# Patient Record
Sex: Female | Born: 1950 | Race: White | Hispanic: No | Smoking: Former smoker
Health system: Southern US, Community
[De-identification: ages and names within clinical notes are randomized; demographics above are authoritative.]

## PROBLEM LIST (undated history)

## (undated) DIAGNOSIS — H8103 Meniere's disease, bilateral: Secondary | ICD-10-CM

## (undated) DIAGNOSIS — N189 Chronic kidney disease, unspecified: Secondary | ICD-10-CM

## (undated) DIAGNOSIS — G4730 Sleep apnea, unspecified: Secondary | ICD-10-CM

## (undated) DIAGNOSIS — I2510 Atherosclerotic heart disease of native coronary artery without angina pectoris: Secondary | ICD-10-CM

## (undated) DIAGNOSIS — I10 Essential (primary) hypertension: Secondary | ICD-10-CM

## (undated) DIAGNOSIS — Z8679 Personal history of other diseases of the circulatory system: Secondary | ICD-10-CM

## (undated) DIAGNOSIS — M810 Age-related osteoporosis without current pathological fracture: Secondary | ICD-10-CM

## (undated) DIAGNOSIS — I517 Cardiomegaly: Secondary | ICD-10-CM

## (undated) DIAGNOSIS — I639 Cerebral infarction, unspecified: Secondary | ICD-10-CM

## (undated) DIAGNOSIS — M1990 Unspecified osteoarthritis, unspecified site: Secondary | ICD-10-CM

## (undated) DIAGNOSIS — I219 Acute myocardial infarction, unspecified: Secondary | ICD-10-CM

## (undated) DIAGNOSIS — R42 Dizziness and giddiness: Secondary | ICD-10-CM

## (undated) DIAGNOSIS — J45909 Unspecified asthma, uncomplicated: Secondary | ICD-10-CM

## (undated) DIAGNOSIS — I340 Nonrheumatic mitral (valve) insufficiency: Secondary | ICD-10-CM

## (undated) DIAGNOSIS — I5189 Other ill-defined heart diseases: Secondary | ICD-10-CM

## (undated) DIAGNOSIS — E785 Hyperlipidemia, unspecified: Secondary | ICD-10-CM

## (undated) DIAGNOSIS — F329 Major depressive disorder, single episode, unspecified: Secondary | ICD-10-CM

## (undated) DIAGNOSIS — J302 Other seasonal allergic rhinitis: Secondary | ICD-10-CM

## (undated) DIAGNOSIS — B1920 Unspecified viral hepatitis C without hepatic coma: Secondary | ICD-10-CM

## (undated) DIAGNOSIS — F32A Depression, unspecified: Secondary | ICD-10-CM

## (undated) DIAGNOSIS — J449 Chronic obstructive pulmonary disease, unspecified: Secondary | ICD-10-CM

## (undated) DIAGNOSIS — M489 Spondylopathy, unspecified: Secondary | ICD-10-CM

## (undated) HISTORY — PX: COLON SURGERY: SHX602

## (undated) HISTORY — DX: Hyperlipidemia, unspecified: E78.5

## (undated) HISTORY — DX: Cerebral infarction, unspecified: I63.9

## (undated) HISTORY — DX: Major depressive disorder, single episode, unspecified: F32.9

## (undated) HISTORY — DX: Meniere's disease, bilateral: H81.03

## (undated) HISTORY — PX: ABDOMINAL HYSTERECTOMY: SHX81

## (undated) HISTORY — PX: DILATION AND CURETTAGE OF UTERUS: SHX78

## (undated) HISTORY — DX: Unspecified viral hepatitis C without hepatic coma: B19.20

## (undated) HISTORY — DX: Acute myocardial infarction, unspecified: I21.9

## (undated) HISTORY — DX: Chronic kidney disease, unspecified: N18.9

## (undated) HISTORY — DX: Depression, unspecified: F32.A

## (undated) HISTORY — PX: TONSILLECTOMY: SUR1361

## (undated) HISTORY — PX: OVARIAN CYST REMOVAL: SHX89

## (undated) HISTORY — DX: Essential (primary) hypertension: I10

---

## 2011-11-01 DIAGNOSIS — I639 Cerebral infarction, unspecified: Secondary | ICD-10-CM

## 2011-11-01 HISTORY — DX: Cerebral infarction, unspecified: I63.9

## 2013-10-18 ENCOUNTER — Ambulatory Visit: Payer: Self-pay | Admitting: Family Medicine

## 2014-07-31 ENCOUNTER — Ambulatory Visit: Payer: Self-pay | Admitting: Family Medicine

## 2014-08-04 ENCOUNTER — Ambulatory Visit: Payer: Self-pay | Admitting: Family Medicine

## 2014-08-04 DIAGNOSIS — I517 Cardiomegaly: Secondary | ICD-10-CM

## 2014-08-06 DIAGNOSIS — I639 Cerebral infarction, unspecified: Secondary | ICD-10-CM | POA: Insufficient documentation

## 2014-08-06 DIAGNOSIS — R51 Headache: Secondary | ICD-10-CM

## 2014-08-06 DIAGNOSIS — R519 Headache, unspecified: Secondary | ICD-10-CM | POA: Insufficient documentation

## 2014-08-06 HISTORY — DX: Cerebral infarction, unspecified: I63.9

## 2014-08-08 ENCOUNTER — Ambulatory Visit: Payer: Self-pay | Admitting: Family Medicine

## 2014-12-15 ENCOUNTER — Ambulatory Visit: Payer: Self-pay | Admitting: Family Medicine

## 2015-05-12 ENCOUNTER — Other Ambulatory Visit: Payer: Self-pay | Admitting: Family Medicine

## 2015-05-12 NOTE — Telephone Encounter (Signed)
Routing to provider  

## 2015-07-28 ENCOUNTER — Other Ambulatory Visit: Payer: Self-pay | Admitting: Family Medicine

## 2015-09-16 ENCOUNTER — Other Ambulatory Visit: Payer: Self-pay | Admitting: Family Medicine

## 2015-09-16 NOTE — Telephone Encounter (Signed)
rx

## 2015-09-16 NOTE — Telephone Encounter (Signed)
Routing to provider  

## 2015-09-16 NOTE — Telephone Encounter (Signed)
Patient is way overdue for visit and labs Rx sent as requested TERESA-- Please let [name] know that I'd like to see patient for an appointment here in the office for:   Please schedule a visit with me  in the next: two weeks if possible, no more than a month out if I am booked Fasting?  yes Thank you, Dr. Sanda Klein Also, last note from kidney doctor in [facility] is from May; please add nephrologist to care team and request last note and labs

## 2015-10-10 ENCOUNTER — Encounter: Payer: Self-pay | Admitting: Family Medicine

## 2015-10-10 NOTE — Telephone Encounter (Signed)
After 4x calling and leaving vm, I'm sending letter out. 10/10/15

## 2015-10-14 ENCOUNTER — Other Ambulatory Visit: Payer: Self-pay | Admitting: Family Medicine

## 2015-10-14 NOTE — Telephone Encounter (Signed)
Routing to provider  

## 2015-10-15 NOTE — Telephone Encounter (Signed)
Patient needs appt; letter went out on 10/10/15 rx approved

## 2015-11-19 ENCOUNTER — Other Ambulatory Visit: Payer: Self-pay | Admitting: Family Medicine

## 2015-11-20 NOTE — Telephone Encounter (Signed)
I'll send Rx, but we've not seen her in over 6 months Please let [name] know that I'd like to see patient for an appointment here in the office for:  Follow-up Please schedule a visit with me  in the next: few weeks Fasting?  Yes please Thank you, Dr. Sanda Klein

## 2015-11-20 NOTE — Telephone Encounter (Signed)
Called patient and left vm to return our call and schedule a f/u appointment with fasting labs in the next couple of weeks.

## 2015-11-25 ENCOUNTER — Other Ambulatory Visit: Payer: Self-pay | Admitting: Family Medicine

## 2015-11-27 NOTE — Telephone Encounter (Signed)
2nd vm left on patient phone to call us back and schedule a wellness physical if she hasn't had one/follow-up since she had not been been in a while.

## 2015-11-27 NOTE — Telephone Encounter (Signed)
Please ask patient to schedule an appt; she's not been seen for awhile; if she's due for Medicare wellness visit, plesae schedule that Rx sent

## 2015-12-17 ENCOUNTER — Other Ambulatory Visit: Payer: Self-pay | Admitting: Family Medicine

## 2015-12-17 NOTE — Telephone Encounter (Signed)
Needs to be seen

## 2015-12-22 ENCOUNTER — Other Ambulatory Visit: Payer: Self-pay | Admitting: Family Medicine

## 2015-12-22 NOTE — Telephone Encounter (Signed)
Patient needs an appointment

## 2015-12-24 ENCOUNTER — Telehealth: Payer: Self-pay | Admitting: Family Medicine

## 2015-12-24 MED ORDER — MECLIZINE HCL 25 MG PO TABS: 25.0000 mg | ORAL_TABLET | Freq: Three times a day (TID) | ORAL | Status: DC | PRN

## 2015-12-24 NOTE — Telephone Encounter (Signed)
meclizine (ANTIVERT) 25 MG tablet  Patient states that she is out of her medication and can not come in for a f/u because she is out in another [place] and wont be back until mid May. [facility] [phone] this is the pharmacy number not fax. If any question please call patient [phone].

## 2015-12-24 NOTE — Telephone Encounter (Signed)
Forward to provider

## 2015-12-24 NOTE — Telephone Encounter (Signed)
sent 

## 2015-12-30 ENCOUNTER — Other Ambulatory Visit: Payer: Self-pay

## 2015-12-30 NOTE — Telephone Encounter (Signed)
She would like a 90 day supply on the meclizine rx. She is going to be out of state until May.

## 2015-12-31 NOTE — Telephone Encounter (Signed)
I'm sorry, but we've not seen her since January of 2016 (over a year ago) Twenty pills is all I'm comfortable prescribing; if she'll be going to be gone that long, she likely needs to get established with someone there or at least go to an urgent care We really need to see our patients at least once a year and we sent out a letter in December asking her to schedule an appointment for follow-up

## 2015-12-31 NOTE — Telephone Encounter (Signed)
Patient notified

## 2016-01-07 ENCOUNTER — Encounter: Payer: Self-pay | Admitting: Family Medicine

## 2016-01-07 NOTE — Telephone Encounter (Signed)
Letter sent home 01/07/16 °

## 2016-01-13 ENCOUNTER — Other Ambulatory Visit: Payer: Self-pay | Admitting: Family Medicine

## 2016-01-16 ENCOUNTER — Other Ambulatory Visit: Payer: Self-pay | Admitting: Family Medicine

## 2016-01-30 ENCOUNTER — Other Ambulatory Visit: Payer: Self-pay | Admitting: Family Medicine

## 2016-02-01 NOTE — Telephone Encounter (Signed)
Please forward to appropriate provider; thank you

## 2016-02-01 NOTE — Telephone Encounter (Signed)
Thank you, rxs approved

## 2016-02-01 NOTE — Telephone Encounter (Signed)
She is actually scheduled to see you at [facility] on May 1st.

## 2016-02-29 ENCOUNTER — Ambulatory Visit: Payer: Self-pay | Admitting: Family Medicine

## 2016-03-24 ENCOUNTER — Encounter: Payer: Self-pay | Admitting: Family Medicine

## 2016-03-24 ENCOUNTER — Ambulatory Visit (INDEPENDENT_AMBULATORY_CARE_PROVIDER_SITE_OTHER): Payer: Federal, State, Local not specified - PPO | Admitting: Family Medicine

## 2016-03-24 VITALS — BP 102/72 | HR 67 | Temp 98.0°F | Resp 16 | Ht 60.0 in | Wt 146.1 lb

## 2016-03-24 DIAGNOSIS — I209 Angina pectoris, unspecified: Secondary | ICD-10-CM

## 2016-03-24 DIAGNOSIS — Z780 Asymptomatic menopausal state: Secondary | ICD-10-CM | POA: Diagnosis not present

## 2016-03-24 DIAGNOSIS — Z0000 Encounter for general adult medical examination without abnormal findings: Secondary | ICD-10-CM | POA: Diagnosis not present

## 2016-03-24 DIAGNOSIS — Z8619 Personal history of other infectious and parasitic diseases: Secondary | ICD-10-CM | POA: Diagnosis not present

## 2016-03-24 DIAGNOSIS — H8103 Meniere's disease, bilateral: Secondary | ICD-10-CM | POA: Diagnosis not present

## 2016-03-24 DIAGNOSIS — Z114 Encounter for screening for human immunodeficiency virus [HIV]: Secondary | ICD-10-CM | POA: Insufficient documentation

## 2016-03-24 DIAGNOSIS — Z9889 Other specified postprocedural states: Secondary | ICD-10-CM

## 2016-03-24 DIAGNOSIS — Z1159 Encounter for screening for other viral diseases: Secondary | ICD-10-CM | POA: Diagnosis not present

## 2016-03-24 DIAGNOSIS — E7800 Pure hypercholesterolemia, unspecified: Secondary | ICD-10-CM | POA: Diagnosis not present

## 2016-03-24 DIAGNOSIS — N181 Chronic kidney disease, stage 1: Secondary | ICD-10-CM | POA: Insufficient documentation

## 2016-03-24 DIAGNOSIS — Z1239 Encounter for other screening for malignant neoplasm of breast: Secondary | ICD-10-CM

## 2016-03-24 DIAGNOSIS — I252 Old myocardial infarction: Secondary | ICD-10-CM

## 2016-03-24 DIAGNOSIS — Z8673 Personal history of transient ischemic attack (TIA), and cerebral infarction without residual deficits: Secondary | ICD-10-CM | POA: Diagnosis not present

## 2016-03-24 HISTORY — DX: Meniere's disease, bilateral: H81.03

## 2016-03-24 MED ORDER — MECLIZINE HCL 25 MG PO TABS: 25.0000 mg | ORAL_TABLET | Freq: Three times a day (TID) | ORAL | Status: DC | PRN

## 2016-03-24 NOTE — Patient Instructions (Addendum)
Have labs done today I've put in referral to ENT and GI and cardiology for you If you have not heard anything from my staff in a week about any orders/referrals/studies from today, please contact us here to follow-up [phone] Please do call to schedule your mammogram; the number to schedule one at either [facility] or [facility] is [phone] Please do call to schedule your bone density study; the number to schedule one at either [facility] or [facility] is [phone]  Health Maintenance, Female Adopting a healthy lifestyle and getting preventive care can go a long way to promote health and wellness. Talk with your health care provider about what schedule of regular examinations is right for you. This is a good chance for you to check in with your provider about disease prevention and staying healthy. In between checkups, there are plenty of things you can do on your own. Experts have done a lot of research about which lifestyle changes and preventive measures are most likely to keep you healthy. Ask your health care provider for more information. WEIGHT AND DIET  Eat a healthy diet  Be sure to include plenty of vegetables, fruits, low-fat dairy products, and lean protein.  Do not eat a lot of foods high in solid fats, added sugars, or salt.  Get regular exercise. This is one of the most important things you can do for your health.  Most adults should exercise for at least 150 minutes each week. The exercise should increase your heart rate and make you sweat (moderate-intensity exercise).  Most adults should also do strengthening exercises at least twice a week. This is in addition to the moderate-intensity exercise.  Maintain a healthy weight  Body mass index (BMI) is a measurement that can be used to identify possible weight problems. It estimates body fat based on height and weight. Your health care provider can help  determine your BMI and help you achieve or maintain a healthy weight.  For females 27 years of age and older:   A BMI below 18.5 is considered underweight.  A BMI of 18.5 to 24.9 is normal.  A BMI of 25 to 29.9 is considered overweight.  A BMI of 30 and above is considered obese.  Watch levels of cholesterol and blood lipids  You should start having your blood tested for lipids and cholesterol at 66 years of age, then have this test every 5 years.  You may need to have your cholesterol levels checked more often if:  Your lipid or cholesterol levels are high.  You are older than 66 years of age.  You are at high risk for heart disease.  CANCER SCREENING   Lung Cancer  Lung cancer screening is recommended for adults 32-60 years old who are at high risk for lung cancer because of a history of smoking.  A yearly low-dose CT scan of the lungs is recommended for people who:  Currently smoke.  Have quit within the past 15 years.  Have at least a 30-pack-year history of smoking. A pack year is smoking an average of one pack of cigarettes a day for 1 year.  Yearly screening should continue until it has been 15 years since you quit.  Yearly screening should stop if you develop a health problem that would prevent you from having lung cancer treatment.  Breast Cancer  Practice breast self-awareness. This means understanding how your breasts normally appear and feel.  It also means  doing regular breast self-exams. Let your health care provider know about any changes, no matter how small.  If you are in your 20s or 30s, you should have a clinical breast exam (CBE) by a health care provider every 1-3 years as part of a regular health exam.  If you are 51 or older, have a CBE every year. Also consider having a breast X-ray (mammogram) every year.  If you have a family history of breast cancer, talk to your health care provider about genetic screening.  If you are at high risk  for breast cancer, talk to your health care provider about having an MRI and a mammogram every year.  Breast cancer gene (BRCA) assessment is recommended for women who have family members with BRCA-related cancers. BRCA-related cancers include:  Breast.  Ovarian.  Tubal.  Peritoneal cancers.  Results of the assessment will determine the need for genetic counseling and BRCA1 and BRCA2 testing. Cervical Cancer Your health care provider may recommend that you be screened regularly for cancer of the pelvic organs (ovaries, uterus, and vagina). This screening involves a pelvic examination, including checking for microscopic changes to the surface of your cervix (Pap test). You may be encouraged to have this screening done every 3 years, beginning at age 50.  For women ages 67-65, health care providers may recommend pelvic exams and Pap testing every 3 years, or they may recommend the Pap and pelvic exam, combined with testing for human papilloma virus (HPV), every 5 years. Some types of HPV increase your risk of cervical cancer. Testing for HPV may also be done on women of any age with unclear Pap test results.  Other health care providers may not recommend any screening for nonpregnant women who are considered low risk for pelvic cancer and who do not have symptoms. Ask your health care provider if a screening pelvic exam is right for you.  If you have had past treatment for cervical cancer or a condition that could lead to cancer, you need Pap tests and screening for cancer for at least 20 years after your treatment. If Pap tests have been discontinued, your risk factors (such as having a new sexual partner) need to be reassessed to determine if screening should resume. Some women have medical problems that increase the chance of getting cervical cancer. In these cases, your health care provider may recommend more frequent screening and Pap tests. Colorectal Cancer  This type of cancer can be  detected and often prevented.  Routine colorectal cancer screening usually begins at 66 years of age and continues through 66 years of age.  Your health care provider may recommend screening at an earlier age if you have risk factors for colon cancer.  Your health care provider may also recommend using home test kits to check for hidden blood in the stool.  A small camera at the end of a tube can be used to examine your colon directly (sigmoidoscopy or colonoscopy). This is done to check for the earliest forms of colorectal cancer.  Routine screening usually begins at age 34.  Direct examination of the colon should be repeated every 5-10 years through 66 years of age. However, you may need to be screened more often if early forms of precancerous polyps or small growths are found. Skin Cancer  Check your skin from head to toe regularly.  Tell your health care provider about any new moles or changes in moles, especially if there is a change in a mole's shape or  color.  Also tell your health care provider if you have a mole that is larger than the size of a pencil eraser.  Always use sunscreen. Apply sunscreen liberally and repeatedly throughout the day.  Protect yourself by wearing long sleeves, pants, a wide-brimmed hat, and sunglasses whenever you are outside. HEART DISEASE, DIABETES, AND HIGH BLOOD PRESSURE   High blood pressure causes heart disease and increases the risk of stroke. High blood pressure is more likely to develop in:  People who have blood pressure in the high end of the normal range (130-139/85-89 mm Hg).  People who are overweight or obese.  People who are African American.  If you are 59-21 years of age, have your blood pressure checked every 3-5 years. If you are 109 years of age or older, have your blood pressure checked every year. You should have your blood pressure measured twice--once when you are at a hospital or clinic, and once when you are not at a  hospital or clinic. Record the average of the two measurements. To check your blood pressure when you are not at a hospital or clinic, you can use:  An automated blood pressure machine at a pharmacy.  A home blood pressure monitor.  If you are between 15 years and 70 years old, ask your health care provider if you should take aspirin to prevent strokes.  Have regular diabetes screenings. This involves taking a blood sample to check your fasting blood sugar level.  If you are at a normal weight and have a low risk for diabetes, have this test once every three years after 66 years of age.  If you are overweight and have a high risk for diabetes, consider being tested at a younger age or more often. PREVENTING INFECTION  Hepatitis B  If you have a higher risk for hepatitis B, you should be screened for this virus. You are considered at high risk for hepatitis B if:  You were born in a country where hepatitis B is common. Ask your health care provider which countries are considered high risk.  Your parents were born in a high-risk country, and you have not been immunized against hepatitis B (hepatitis B vaccine).  You have HIV or AIDS.  You use needles to inject street drugs.  You live with someone who has hepatitis B.  You have had sex with someone who has hepatitis B.  You get hemodialysis treatment.  You take certain medicines for conditions, including cancer, organ transplantation, and autoimmune conditions. Hepatitis C  Blood testing is recommended for:  Everyone born from [birth year] through 1966.  Anyone with known risk factors for hepatitis C. Sexually transmitted infections (STIs)  You should be screened for sexually transmitted infections (STIs) including gonorrhea and chlamydia if:  You are sexually active and are younger than 66 years of age.  You are older than 67 years of age and your health care provider tells you that you are at risk for this type of  infection.  Your sexual activity has changed since you were last screened and you are at an increased risk for chlamydia or gonorrhea. Ask your health care provider if you are at risk.  If you do not have HIV, but are at risk, it may be recommended that you take a prescription medicine daily to prevent HIV infection. This is called pre-exposure prophylaxis (PrEP). You are considered at risk if:  You are sexually active and do not regularly use condoms or know the HIV status  of your partner(s).  You take drugs by injection.  You are sexually active with a partner who has HIV. Talk with your health care provider about whether you are at high risk of being infected with HIV. If you choose to begin PrEP, you should first be tested for HIV. You should then be tested every 3 months for as long as you are taking PrEP.  PREGNANCY   If you are premenopausal and you may become pregnant, ask your health care provider about preconception counseling.  If you may become pregnant, take 400 to 800 micrograms (mcg) of folic acid every day.  If you want to prevent pregnancy, talk to your health care provider about birth control (contraception). OSTEOPOROSIS AND MENOPAUSE   Osteoporosis is a disease in which the bones lose minerals and strength with aging. This can result in serious bone fractures. Your risk for osteoporosis can be identified using a bone density scan.  If you are 34 years of age or older, or if you are at risk for osteoporosis and fractures, ask your health care provider if you should be screened.  Ask your health care provider whether you should take a calcium or vitamin D supplement to lower your risk for osteoporosis.  Menopause may have certain physical symptoms and risks.  Hormone replacement therapy may reduce some of these symptoms and risks. Talk to your health care provider about whether hormone replacement therapy is right for you.  HOME CARE INSTRUCTIONS   Schedule regular  health, dental, and eye exams.  Stay current with your immunizations.   Do not use any tobacco products including cigarettes, chewing tobacco, or electronic cigarettes.  If you are pregnant, do not drink alcohol.  If you are breastfeeding, limit how much and how often you drink alcohol.  Limit alcohol intake to no more than 1 drink per day for nonpregnant women. One drink equals 12 ounces of beer, 5 ounces of wine, or 1 ounces of hard liquor.  Do not use street drugs.  Do not share needles.  Ask your health care provider for help if you need support or information about quitting drugs.  Tell your health care provider if you often feel depressed.  Tell your health care provider if you have ever been abused or do not feel safe at home.   This information is not intended to replace advice given to you by your health care provider. Make sure you discuss any questions you have with your health care provider.   Document Released: 05/02/2011 Document Revised: 11/07/2014 Document Reviewed: 09/18/2013 Elsevier Interactive Patient Education 2016 [place]. Angina Pectoris Angina pectoris, often called angina, is extreme discomfort in the chest, neck, or arm. This is caused by a lack of blood in the middle and thickest layer of the heart wall (myocardium). There are four types of angina:  Stable angina. Stable angina usually occurs in episodes of predictable frequency and duration. It is usually brought on by physical activity, stress, or excitement. Stable angina usually lasts a few minutes and can often be relieved by a medicine that you place under your tongue. This medicine is called sublingual nitroglycerin.  Unstable angina. Unstable angina can occur even when you are doing little or no physical activity. It can even occur while you are sleeping or when you are at rest. It can suddenly increase in severity or frequency. It may not be relieved by sublingual nitroglycerin, and it can  last up to 30 minutes.  Microvascular angina. This type of  angina is caused by a disorder of tiny blood vessels called arterioles. Microvascular angina is more common in women. The pain may be more severe and last longer than other types of angina pectoris.  Prinzmetal or variant angina. This type of angina pectoris is rare and usually occurs when you are doing little or no physical activity. It especially occurs in the early morning hours. CAUSES Atherosclerosis is the cause of angina. This is the buildup of fat and cholesterol (plaque) on the inside of the arteries. Over time, the plaque may narrow or block the artery, and this will lessen blood flow to the heart. Plaque can also become weak and break off within a coronary artery to form a clot and cause a sudden blockage. RISK FACTORS Risk factors common to both men and women include:  High cholesterol levels.  High blood pressure (hypertension).  Tobacco use.  Diabetes.  Family history of angina.  Obesity.  Lack of exercise.  A diet high in saturated fats. Women are at greater risk for angina if they are:  Over age 14.  Postmenopausal. SYMPTOMS Many people do not experience any symptoms during the early stages of angina. As the condition progresses, symptoms common to both men and women may include:  Chest pain.  The pain can be described as a crushing or squeezing in the chest, or a tightness, pressure, fullness, or heaviness in the chest.  The pain can last more than a few minutes, or it can stop and recur.  Pain in the arms, neck, jaw, or back.  Unexplained heartburn or indigestion.  Shortness of breath.  Nausea.  Sudden cold sweats.  Sudden light-headedness. Many women have chest discomfort and some of the other symptoms. However, women often have different (atypical) symptoms, such as:   Fatigue.  Unexplained feelings of nervousness or anxiety.  Unexplained weakness.  Dizziness or  fainting. Sometimes, women may have angina without any symptoms. DIAGNOSIS  Tests to diagnose angina may include:  ECG (electrocardiogram).  Exercise stress test. This looks for signs of blockage when the heart is being exercised.  Pharmacologic stress test. This test looks for signs of blockage when the heart is being stressed with a medicine.  Blood tests.  Coronary angiogram. This is a procedure to look at the coronary arteries to see if there is any blockage. TREATMENT  The treatment of angina may include the following:  Healthy behavioral changes to reduce or control risk factors.  Medicine.  Coronary stenting.A stent helps to keep an artery open.  Coronary angioplasty. This procedure widens a narrowed or blocked artery.  Coronary arterybypass surgery. This will allow your blood to pass the blockage (bypass) to reach your heart. HOME CARE INSTRUCTIONS   Take medicines only as directed by your health care provider.  Do not take the following medicines unless your health care provider approves:  Nonsteroidal anti-inflammatory drugs (NSAIDs), such as ibuprofen, naproxen, or celecoxib.  Vitamin supplements that contain vitamin A, vitamin E, or both.  Hormone replacement therapy that contains estrogen with or without progestin.  Manage other health conditions such as hypertension and diabetes as directed by your health care provider.  Follow a heart-healthy diet. A dietitian can help to educate you about healthy food options and changes.  Use healthy cooking methods such as roasting, grilling, broiling, baking, poaching, steaming, or stir-frying. Talk to a dietitian to learn more about healthy cooking methods.  Follow an exercise program approved by your health care provider.  Maintain a healthy weight. Lose  weight as approved by your health care provider.  Plan rest periods when fatigued.  Learn to manage stress.  Do not use any tobacco products, including  cigarettes, chewing tobacco, or electronic cigarettes. If you need help quitting, ask your health care provider.  If you drink alcohol, and your health care provider approves, limit your alcohol intake to no more than 1 drink per day. One drink equals 12 ounces of beer, 5 ounces of wine, or 1 ounces of hard liquor.  Stop illegal drug use.  Keep all follow-up visits as directed by your health care provider. This is important. SEEK IMMEDIATE MEDICAL CARE IF:   You have pain in your chest, neck, arm, jaw, stomach, or back that lasts more than a few minutes, is recurring, or is unrelieved by taking sublingualnitroglycerin.  You have profuse sweating without cause.  You have unexplained:  Heartburn or indigestion.  Shortness of breath or difficulty breathing.  Nausea or vomiting.  Fatigue.  Feelings of nervousness or anxiety.  Weakness.  Diarrhea.  You have sudden light-headedness or dizziness.  You faint. These symptoms may represent a serious problem that is an emergency. Do not wait to see if the symptoms will go away. Get medical help right away. Call your local emergency services (911 in the [place] Do not drive yourself to the hospital.   This information is not intended to replace advice given to you by your health care provider. Make sure you discuss any questions you have with your health care provider.   Document Released: 10/17/2005 Document Revised: 11/07/2014 Document Reviewed: 02/18/2014 Elsevier Interactive Patient Education [employer].

## 2016-03-24 NOTE — Progress Notes (Signed)
Patient ID: Tricia Vasquez, female   DOB: 30-Jan-1951, 65 y.o.   MRN: [id number]   Subjective:   Tricia Vasquez is a 65 y.o. female here for a complete physical exam  Interim issues since last visit: has been out of state  USPSTF grade A and B recommendations Alcohol: no Depression:. Depression screen PHQ 2/9 03/24/2016  Decreased Interest 1  Down, Depressed, Hopeless 1  PHQ - 2 Score 2  Altered sleeping 0  Tired, decreased energy 1  Change in appetite 1  Feeling bad or failure about yourself  0  Trouble concentrating 1  Moving slowly or fidgety/restless 1  Suicidal thoughts 0  PHQ-9 Score 6  Difficult doing work/chores Somewhat difficult  thinks it might just be PMS; treating like PMS; no SI/HI Taking fluoxetine 60 mg daily; since 02/25/2011 when he older brother died; stable on dose Hypertension: beautiful BP today Obesity: no; lost 10 pounds since last visit; running after grandchild Tobacco use: former smoker; quit in 02-24-73 HIV, hep C: has hep C; no treatment, did hers through herbal medicine; no sign in the blood; they offered her treatment years ago when first diagnosed, never did interferon STD testing and prevention (chl/gon/syphilis):  Lipids: today Glucose: today Colorectal cancer: ordered Breast cancer: ordered Cervical cancer screening: n/a Lung cancer: n/a Osteoporosis: ordered dexa Fall prevention/vitamin D: taking vit D, discussed AAA: no fam hx Aspirin: taking 81 mg aspirin Diet: typical American eater Exercise: regular exercise, does get chest pain, long-term, ongoing; uses NTG Skin cancer: no worrisome moles  Past Medical History  Diagnosis Date  . Hypertension   . Hyperlipidemia   . Depression   . Hepatitis C   . Chronic kidney disease   . Heart attack ([place])   . Stroke ([place])   . Heart attack ([place])   . 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 Tonsillectomy    . Abdominal hysterectomy    .  Ovarian cyst removal    . Colon surgery     Family History  Problem Relation Age of Onset  . Hypertension Mother   . Hypertension Father   . Hypertension Sister   . Hypertension Brother    Social History  Substance Use Topics  . Smoking status: Former Research scientist (life sciences)  . Smokeless tobacco: Never Used  . Alcohol Use: No   Review of Systems  HENT: Positive for hearing loss (in 1970s; meniere's disease; right worse).        Blew a polyp out of her nose in [place]; very little bleeding  Cardiovascular: Positive for chest pain (angina, relieved by one NTG; going on for years, no recent stress test).  Gastrointestinal: Negative for blood in stool.  Genitourinary: Negative for hematuria.  Hematological: Bruises/bleeds easily (easy bruising).  Psychiatric/Behavioral: Negative for dysphoric mood.   Objective:   Filed Vitals:   03/24/16 1437  BP: 102/72  Pulse: 67  Temp: 98 F (36.7 C)  TempSrc: Oral  Resp: 16  Height: 5' (1.524 m)  Weight: 146 lb 1.6 oz (66.271 kg)  SpO2: 98%   Body mass index is 28.53 kg/(m^2). Wt Readings from Last 3 Encounters:  03/24/16 146 lb 1.6 oz (66.271 kg)   Physical Exam  Constitutional: She appears well-developed and well-nourished.  HENT:  Head: Normocephalic and atraumatic.  Right Ear: Hearing, tympanic membrane, external ear and ear canal normal.  Left Ear: Hearing, tympanic membrane, external ear and ear canal normal.  Eyes: Conjunctivae and EOM are  normal. Right eye exhibits no hordeolum. Left eye exhibits no hordeolum. No scleral icterus.  Neck: Carotid bruit is not present. No thyromegaly present.  Cardiovascular: Normal rate, regular rhythm, S1 normal, S2 normal and normal heart sounds.   No extrasystoles are present.  Pulmonary/Chest: Effort normal and breath sounds normal. No respiratory distress.  Abdominal: Soft. Normal appearance and bowel sounds are normal. She exhibits no distension, no abdominal bruit, no pulsatile midline mass and no  mass. There is no hepatosplenomegaly. There is no tenderness. No hernia.  Musculoskeletal: Normal range of motion. She exhibits no edema.  Lymphadenopathy:       Head (right side): No submandibular adenopathy present.       Head (left side): No submandibular adenopathy present.    She has no cervical adenopathy.    She has no axillary adenopathy.  Neurological: She is alert. She displays no tremor. No cranial nerve deficit. She exhibits normal muscle tone. Gait normal.  Reflex Scores:      Patellar reflexes are 2+ on the right side and 2+ on the left side. Skin: Skin is warm and dry. No bruising and no ecchymosis noted. No cyanosis. No pallor.  Psychiatric: Her speech is normal and behavior is normal. Thought content normal. Her mood appears not anxious. She does not exhibit a depressed mood.   Assessment/Plan:   Problem List Items Addressed This Visit      Nervous and Auditory   Meniere's disease of both ears    On antivert, but will refer to ENT      Relevant Orders   Ambulatory referral to ENT     Genitourinary   Chronic kidney disease (CKD), stage I     Other   Encounter for screening for HIV    Discussed one-time HIV screening recommendation per USPSTF guidelines; patient agrees with testing; HIV antibody ordered      Relevant Orders   HIV antibody   High blood cholesterol level    Patient did not tolerate statins; check fasting lipid panel; consider PCSK9 inhibitor; limit saturated fats      Hx of completed stroke   Hx of hepatitis C    Not sure if patient meant that she had hx of hep C or other hepatitis; will recheck hep C Ab, and f/u with quantitative and genotype if positive      Hx of myocardial infarction    First MI in 1981; has had two heart attacks in her lifetime; ongoing angina, relieved with NTG; she does not see a cardiologist, however; referral entered; she agrees to go; she reports intolerance to aspirin      Need for hepatitis C screening test     Discussed one-time hep C screening recommendation for individuals born between 1945-1965 per USPSTF guidelines; patient agrees with testing; Hep C Ab ordered      Relevant Orders   Hepatitis C antibody   Preventative health care - Primary    USPSTF grade A and B recommendations reviewed with patient; age-appropriate recommendations, preventive care, screening tests, etc discussed and encouraged; healthy living encouraged; see AVS for patient education given to patient       Other Visit Diagnoses    Angina pectoris [facility])        Relevant Orders    Ambulatory referral to Cardiology    CBC with Differential/Platelet    Comprehensive metabolic panel    Lipid Panel w/o Chol/HDL Ratio    Breast cancer screening  Relevant Orders    MM DIGITAL SCREENING BILATERAL    Postmenopausal status        Relevant Orders    DG Bone Density    S/P colonoscopy with polypectomy        Relevant Orders    Ambulatory referral to Gastroenterology       Meds ordered this encounter  Medications  . GARLIC OIL PO    Sig: Take 1 pg/oz/day by mouth once.  . Cholecalciferol (VITAMIN D) 2000 units tablet    Sig: Take 2,000 Units by mouth 3 days.  . Milk Thistle 250 MG CAPS    Sig: Take 1 capsule by mouth once.  . Misc Natural Products (DANDELION ROOT PO)    Sig: Take 1 tablet by mouth once.  Marland Kitchen BLACK WALNUT POLLEN [place]    Sig: Inject 1 tablet into the skin once.  . TURMERIC PO    Sig: Take 1 tablet by mouth once.  . Cyanocobalamin (VITAMIN B-12) 5000 MCG SUBL    Sig: Place 1 tablet under the tongue 3 days.  . meclizine (ANTIVERT) 25 MG tablet    Sig: Take 1 tablet (25 mg total) by mouth 3 (three) times daily as needed for dizziness.    Dispense:  60 tablet    Refill:  5   Orders Placed This Encounter  Procedures  . MM DIGITAL SCREENING BILATERAL  . DG Bone Density  . HIV antibody  . Hepatitis C antibody  . CBC with Differential/Platelet  . Comprehensive metabolic panel  . Lipid Panel w/o  Chol/HDL Ratio  . Ambulatory referral to Cardiology  . Ambulatory referral to Gastroenterology  . Ambulatory referral to ENT   Follow up plan: Return in about 1 year (around 03/24/2017) for complete physical; 6 months for general follow-up.  An After Visit Summary was printed and given to the patient.

## 2016-03-27 NOTE — Assessment & Plan Note (Signed)
On antivert, but will refer to ENT

## 2016-03-27 NOTE — Assessment & Plan Note (Signed)
Discussed one-time HIV screening recommendation per USPSTF guidelines; patient agrees with testing; HIV antibody ordered 

## 2016-03-27 NOTE — Assessment & Plan Note (Signed)
Patient did not tolerate statins; check fasting lipid panel; consider PCSK9 inhibitor; limit saturated fats

## 2016-03-27 NOTE — Assessment & Plan Note (Signed)
Not sure if patient meant that she had hx of hep C or other hepatitis; will recheck hep C Ab, and f/u with quantitative and genotype if positive

## 2016-03-27 NOTE — Assessment & Plan Note (Signed)
Discussed one-time hep C screening recommendation for individuals born between 1945-1965 per USPSTF guidelines; patient agrees with testing; Hep C Ab ordered 

## 2016-03-27 NOTE — Assessment & Plan Note (Addendum)
First MI in 1981; has had two heart attacks in her lifetime; ongoing angina, relieved with NTG; she does not see a cardiologist, however; referral entered; she agrees to go; she reports intolerance to aspirin

## 2016-03-27 NOTE — Assessment & Plan Note (Signed)
USPSTF grade A and B recommendations reviewed with patient; age-appropriate recommendations, preventive care, screening tests, etc discussed and encouraged; healthy living encouraged; see AVS for patient education given to patient  

## 2016-04-11 ENCOUNTER — Encounter: Payer: Self-pay | Admitting: *Deleted

## 2016-04-11 DIAGNOSIS — E876 Hypokalemia: Secondary | ICD-10-CM | POA: Insufficient documentation

## 2016-04-11 DIAGNOSIS — J45909 Unspecified asthma, uncomplicated: Secondary | ICD-10-CM | POA: Insufficient documentation

## 2016-04-11 DIAGNOSIS — B1920 Unspecified viral hepatitis C without hepatic coma: Secondary | ICD-10-CM | POA: Insufficient documentation

## 2016-04-11 DIAGNOSIS — F32A Depression, unspecified: Secondary | ICD-10-CM | POA: Insufficient documentation

## 2016-04-11 DIAGNOSIS — H8109 Meniere's disease, unspecified ear: Secondary | ICD-10-CM | POA: Insufficient documentation

## 2016-04-11 DIAGNOSIS — G4730 Sleep apnea, unspecified: Secondary | ICD-10-CM | POA: Insufficient documentation

## 2016-04-11 DIAGNOSIS — I10 Essential (primary) hypertension: Secondary | ICD-10-CM | POA: Insufficient documentation

## 2016-04-11 DIAGNOSIS — E785 Hyperlipidemia, unspecified: Secondary | ICD-10-CM | POA: Insufficient documentation

## 2016-04-11 DIAGNOSIS — H81319 Aural vertigo, unspecified ear: Secondary | ICD-10-CM | POA: Insufficient documentation

## 2016-04-11 DIAGNOSIS — M810 Age-related osteoporosis without current pathological fracture: Secondary | ICD-10-CM | POA: Insufficient documentation

## 2016-04-11 DIAGNOSIS — E782 Mixed hyperlipidemia: Secondary | ICD-10-CM | POA: Insufficient documentation

## 2016-04-11 DIAGNOSIS — B182 Chronic viral hepatitis C: Secondary | ICD-10-CM | POA: Insufficient documentation

## 2016-04-11 DIAGNOSIS — F329 Major depressive disorder, single episode, unspecified: Secondary | ICD-10-CM | POA: Insufficient documentation

## 2016-04-15 ENCOUNTER — Other Ambulatory Visit: Payer: Self-pay | Admitting: Family Medicine

## 2016-04-15 NOTE — Telephone Encounter (Signed)
Refill approved. Thank you.

## 2016-04-15 NOTE — Telephone Encounter (Signed)
Looks like this lovely lady is yours!

## 2016-04-16 ENCOUNTER — Other Ambulatory Visit: Payer: Self-pay | Admitting: Family Medicine

## 2016-04-23 ENCOUNTER — Encounter: Payer: Self-pay | Admitting: Family Medicine

## 2016-04-28 ENCOUNTER — Other Ambulatory Visit: Payer: Self-pay

## 2016-04-28 MED ORDER — HYDROCHLOROTHIAZIDE 25 MG PO TABS: 25.0000 mg | ORAL_TABLET | Freq: Every day | ORAL | Status: DC

## 2016-04-28 MED ORDER — MONTELUKAST SODIUM 10 MG PO TABS: 10.0000 mg | ORAL_TABLET | Freq: Every day | ORAL | Status: DC

## 2016-04-28 MED ORDER — ATENOLOL 50 MG PO TABS: 50.0000 mg | ORAL_TABLET | Freq: Every day | ORAL | Status: DC

## 2016-04-28 NOTE — Telephone Encounter (Signed)
Patient requesting refill. 

## 2016-04-28 NOTE — Telephone Encounter (Signed)
I still don't have lab results that were ordered in May; I really need those to verify that her kidneys are working well; please ask her have done within the next 5 days so I'll get results

## 2016-05-04 IMAGING — MR MRI HEAD WITHOUT AND WITH CONTRAST
11 of 12 series · 40 of 48 positions shown · IV contrast (13 ML MULTIHANCE)
Comparison: Head CT 07/31/2014

CLINICAL DATA: Stroke, 4 weeks ago. Trauma to the head, striking
the head on a cabinet 4 weeks ago. Right-sided weakness since then.
Right facial weakness.

EXAM:
MRI HEAD WITHOUT AND WITH CONTRAST
TECHNIQUE: Multiplanar, multiecho pulse sequences of the brain and surrounding
structures were obtained without and with intravenous contrast.
CONTRAST:  13 cc MultiHance

[Series 2: T1 · sagittal · 5.0mm · 0.45mm/px · 1 of 27 slices shown]
[im 1/27]
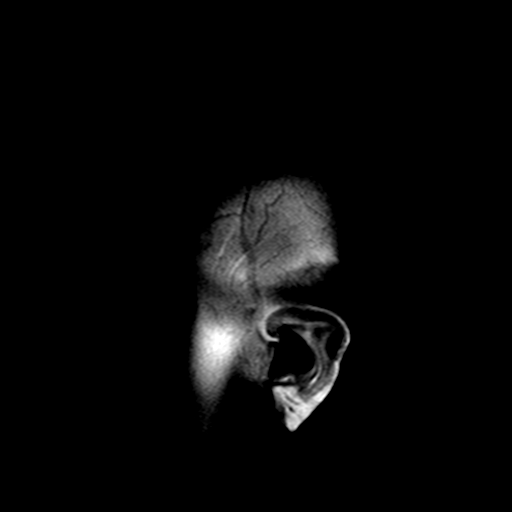

[Series 4: DWI · axial · 5.0mm · 1.80mm/px · z∈[-64,+92]mm · 3 of 25 slices shown (1 of 4)]
[im 1/25]
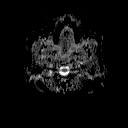
[im 13/25]
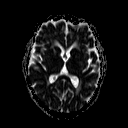
[im 25/25]
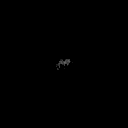

[Series 5: DWI · axial · 5.0mm · 1.80mm/px · z∈[-57,+86]mm · 3 of 23 slices shown (2 of 4)]
[im 1/23]
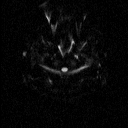
[im 12/23]
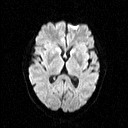
[im 23/23]
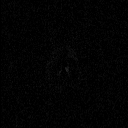

[Series 7: DWI · coronal · 5.0mm · 1.80mm/px · 5 of 38 slices shown (3 of 4)]
[im 1/38]
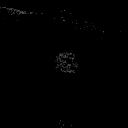
[im 10/38]
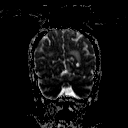
[im 19/38]
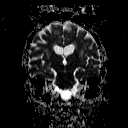
[im 28/38]
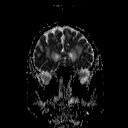
[im 38/38]
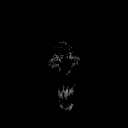

[Series 8: T2 · axial · 5.0mm · 0.45mm/px · z∈[-63,+93]mm · 3 of 25 slices shown (1 of 3)]
[im 1/25]
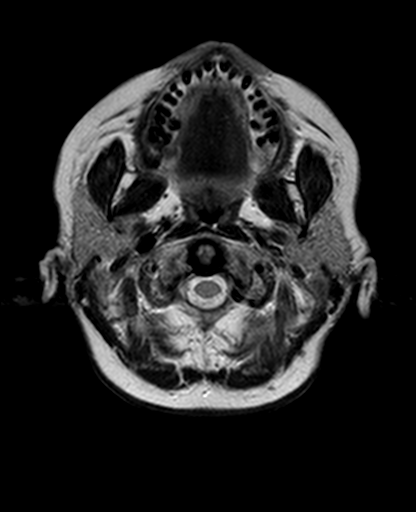
[im 13/25]
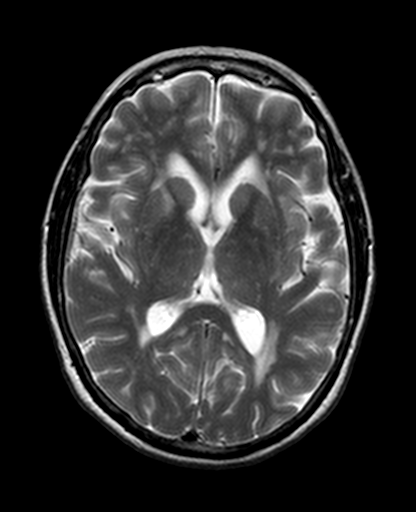
[im 25/25]
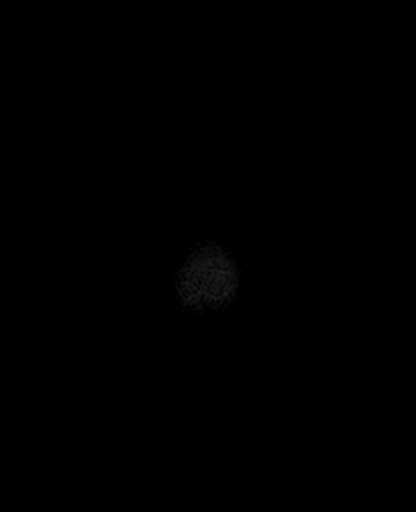

[Series 9: DWI · coronal · 5.0mm · 1.80mm/px · 4 of 35 slices shown (4 of 4)]
[im 1/35]
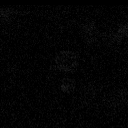
[im 12/35]
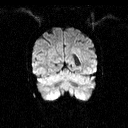
[im 23/35]
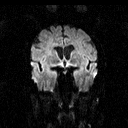
[im 35/35]
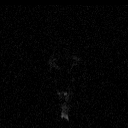

[Series 10: FLAIR · axial · 5.0mm · 0.90mm/px · z∈[-63,+93]mm · 3 of 25 slices shown]
[im 1/25]
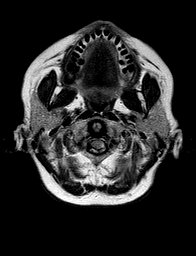
[im 13/25]
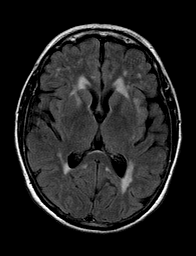
[im 25/25]
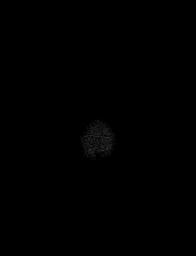

[Series 11: T2 · axial · 5.0mm · 0.45mm/px · z∈[-63,+93]mm · 3 of 25 slices shown (2 of 3)]
[im 1/25]
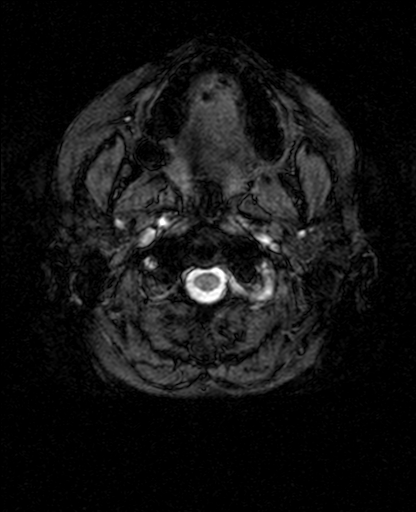
[im 13/25]
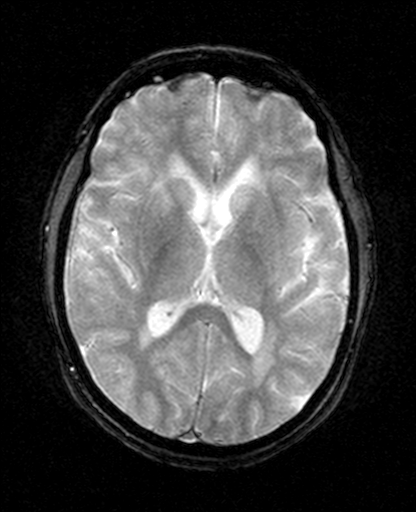
[im 25/25]
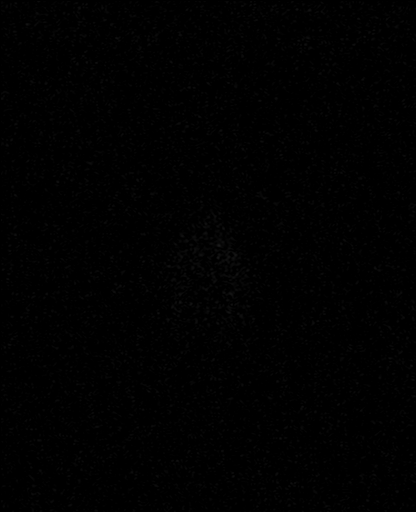

[Series 13: T2 · coronal · 5.0mm · 0.45mm/px · 4 of 30 slices shown (3 of 3)]
[im 1/30]
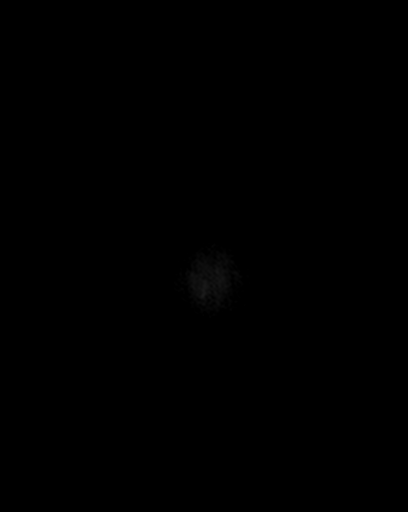
[im 10/30]
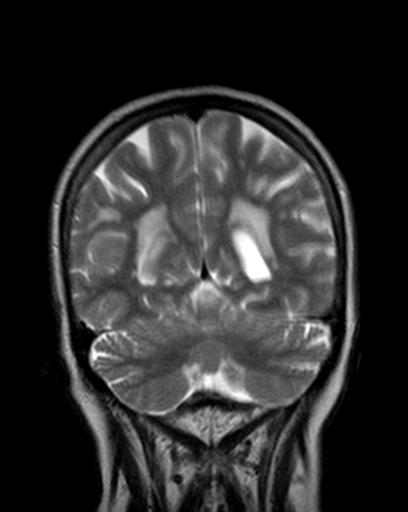
[im 20/30]
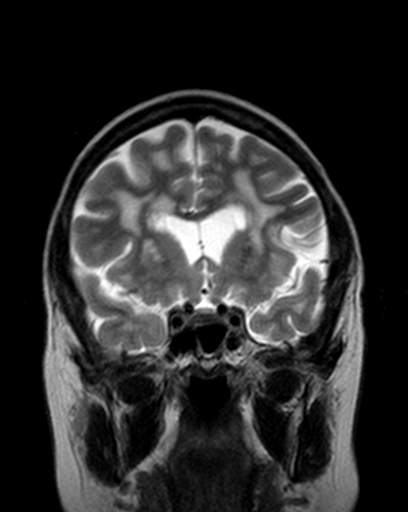
[im 30/30]
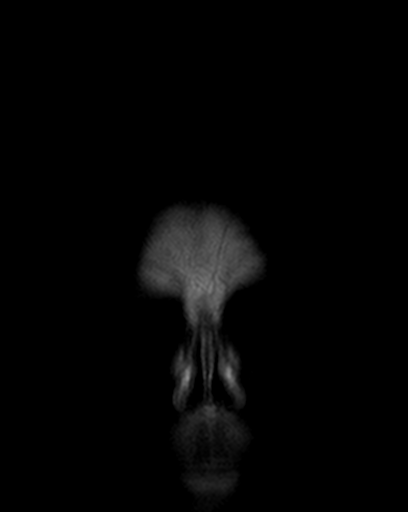

[Series 14: T1 post-contrast · axial · 3.0mm · 0.45mm/px · z∈[-73,+103]mm · 7 of 60 slices shown (1 of 2)]
[im 1/60]
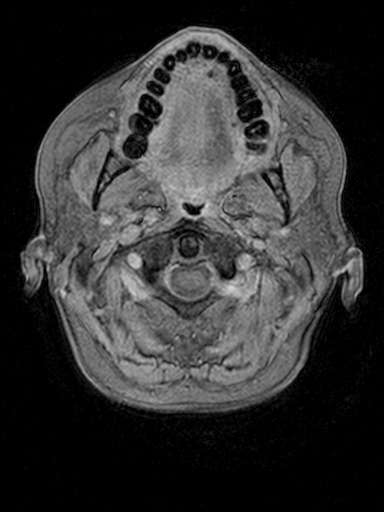
[im 10/60]
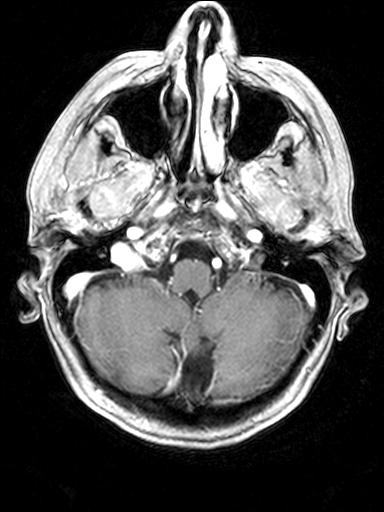
[im 20/60]
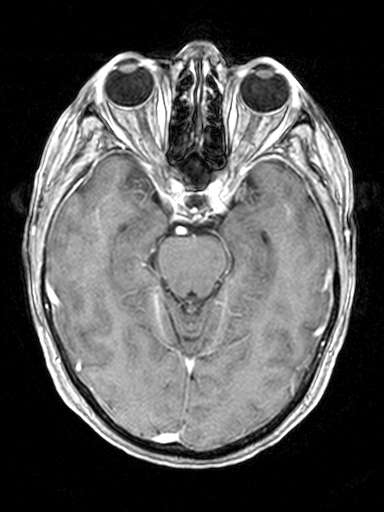
[im 30/60]
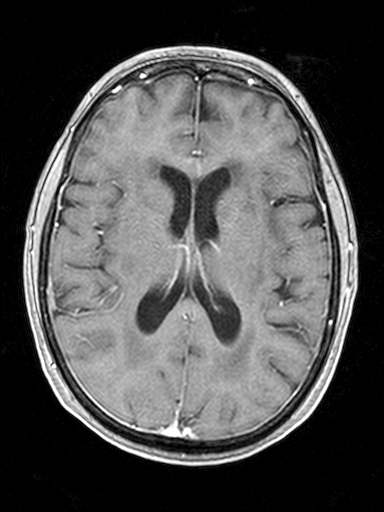
[im 40/60]
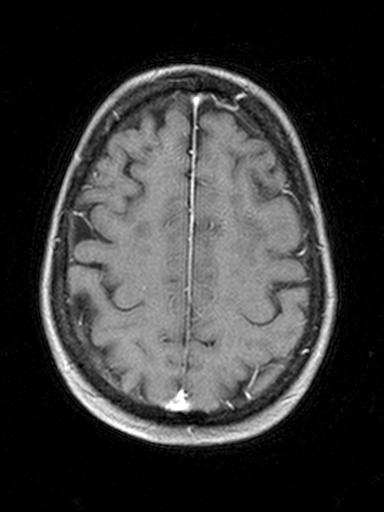
[im 50/60]
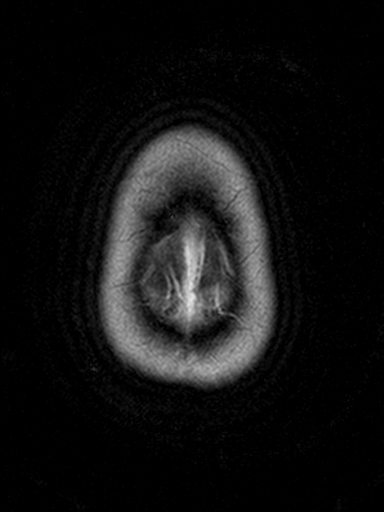
[im 60/60]
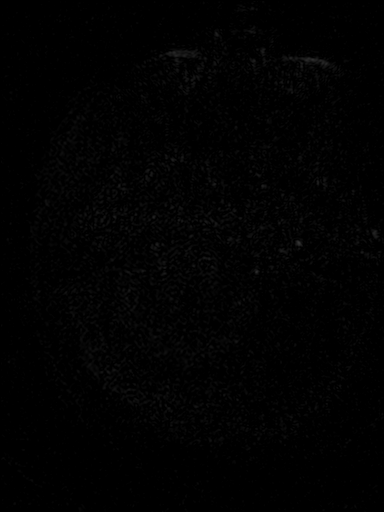

[Series 15: T1 post-contrast · coronal · 5.0mm · 0.45mm/px · 4 of 30 slices shown (2 of 2)]
[im 1/30]
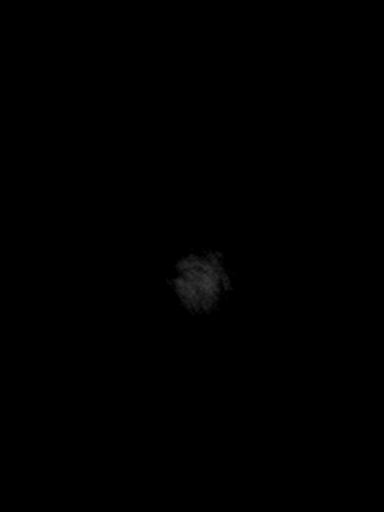
[im 10/30]
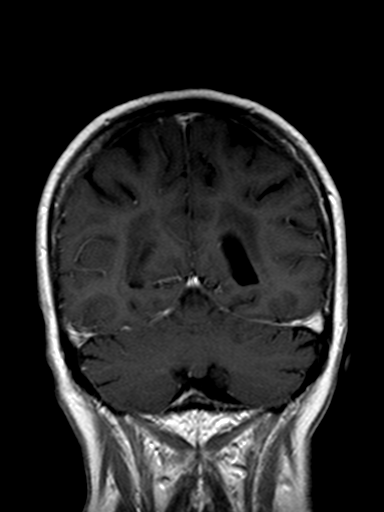
[im 20/30]
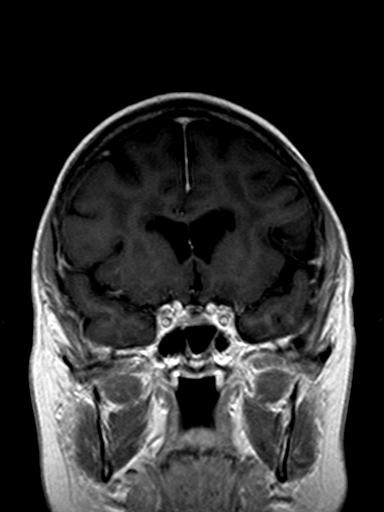
[im 30/30]
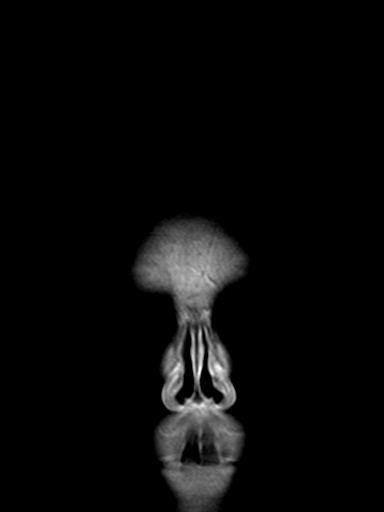

[40 of 48 positions shown; findings below may reference images not displayed]

FINDINGS: Diffusion imaging does not show any acute or subacute infarction.
The brainstem and cerebellum are normal. The cerebral hemispheres
show patchy and confluent areas of FLAIR and T2 signal throughout
the deep and subcortical white matter consistent with chronic small
vessel ischemic change. No large vessel territory infarction. No
mass lesion, hemorrhage, hydrocephalus or extra-axial collection.
After contrast administration, no abnormal enhancement occurs. No
pituitary mass. No inflammatory sinus disease. No skull or skullbase
lesion. Major vessels are patent at the base of the brain.
IMPRESSION: No sign of recent infarction. No traumatic finding. Advanced chronic
small vessel ischemic changes throughout the cerebral hemispheric
white matter.

## 2016-07-22 ENCOUNTER — Other Ambulatory Visit: Payer: Self-pay

## 2016-07-22 MED ORDER — ALBUTEROL SULFATE HFA 108 (90 BASE) MCG/ACT IN AERS: 2.0000 | INHALATION_SPRAY | RESPIRATORY_TRACT | 0 refills | Status: DC | PRN

## 2016-08-26 ENCOUNTER — Other Ambulatory Visit: Payer: Self-pay

## 2016-08-28 MED ORDER — NITROGLYCERIN 0.4 MG SL SUBL: 0.4000 mg | SUBLINGUAL_TABLET | SUBLINGUAL | 0 refills | Status: DC | PRN

## 2016-08-30 ENCOUNTER — Other Ambulatory Visit: Payer: Self-pay | Admitting: Family Medicine

## 2016-08-31 NOTE — Telephone Encounter (Signed)
Chart reviewed; labs were ordered in May, no results Please contact patient I really need to see her lab results for any further refills; I am approving 30 days only but she has to have labs done for any additional medicine If not able to come in for labs and out of state, we'll recommend she establish with someone there to monitor her health

## 2016-09-01 NOTE — Telephone Encounter (Signed)
Called pt regarding  medication refill. Left a detail messaged about her medication and suggest pt to call us back if she has any concerns or question regarding the message I left.

## 2016-09-01 NOTE — Telephone Encounter (Signed)
Called pt and left a detail message.

## 2016-09-18 ENCOUNTER — Other Ambulatory Visit: Payer: Self-pay | Admitting: Family Medicine

## 2016-09-20 NOTE — Telephone Encounter (Signed)
Patient has outstanding labs from 6 months ago appt next week appt and labs needed; will discuss compliance at her appt next week

## 2016-09-21 ENCOUNTER — Telehealth: Payer: Self-pay | Admitting: Family Medicine

## 2016-09-21 MED ORDER — FLUOXETINE HCL 60 MG PO TABS: 1.0000 | ORAL_TABLET | Freq: Every day | ORAL | 0 refills | Status: DC

## 2016-09-21 NOTE — Telephone Encounter (Signed)
Pt informed

## 2016-09-21 NOTE — Telephone Encounter (Signed)
I will allow one refill of 30 days only I recommend she make an appointment now for when she'll be in town If she is living in [place] now and cannot come in for appointments and labs, we'll recommend that she get established with a doctor in [place] It's so very important for her health to be monitored; we are just trying to look out for her We wish her a happy Thanksgiving

## 2016-09-21 NOTE — Telephone Encounter (Signed)
Patient is out of states with her daughter and will not be back in town til after Dec 5 can you please five enough fluoxetine to last  Til then?

## 2016-09-21 NOTE — Telephone Encounter (Signed)
Pt is out of town for the holiday and could not make her appt for Monday. Pt request refill on Fluoxetine to be sent to [facility] [place]. Pt is there for the holidays and states if she needs to come here to be seen then it has to be December.

## 2016-09-26 ENCOUNTER — Ambulatory Visit: Payer: Federal, State, Local not specified - PPO | Admitting: Family Medicine

## 2016-09-30 DIAGNOSIS — F411 Generalized anxiety disorder: Secondary | ICD-10-CM | POA: Insufficient documentation

## 2016-09-30 DIAGNOSIS — J4540 Moderate persistent asthma, uncomplicated: Secondary | ICD-10-CM | POA: Insufficient documentation

## 2017-03-06 ENCOUNTER — Other Ambulatory Visit: Payer: Self-pay | Admitting: Family Medicine

## 2017-03-06 NOTE — Telephone Encounter (Signed)
Patient appears to not be in the area anymore Please clarify; this request came from [place] If she has moved out of state, we suggest she finds a new doctor She never got the labs done that I ordered last May We wish her the best, but if she's not in the area, we recommend she find a doctor close to home

## 2017-03-06 NOTE — Telephone Encounter (Signed)
Left voice mail

## 2017-03-28 ENCOUNTER — Encounter: Payer: Federal, State, Local not specified - PPO | Admitting: Family Medicine

## 2017-03-30 ENCOUNTER — Other Ambulatory Visit: Payer: Self-pay | Admitting: Family Medicine

## 2017-12-22 DIAGNOSIS — M5412 Radiculopathy, cervical region: Secondary | ICD-10-CM | POA: Insufficient documentation

## 2018-01-03 ENCOUNTER — Other Ambulatory Visit: Payer: Self-pay | Admitting: Family Medicine

## 2018-01-04 NOTE — Telephone Encounter (Signed)
Rx denied Patient has not been seen since 2017 Rx request to [place] so she may find a doctor there

## 2018-07-24 ENCOUNTER — Encounter: Payer: Federal, State, Local not specified - PPO | Admitting: Family Medicine

## 2018-08-08 ENCOUNTER — Encounter: Payer: Self-pay | Admitting: Nurse Practitioner

## 2018-08-08 ENCOUNTER — Telehealth: Payer: Self-pay

## 2018-08-08 NOTE — Progress Notes (Deleted)
Patient: Tricia Vasquez, Female    DOB: 20-Oct-1951, 67 y.o.   MRN: [id number]  Visit Date: 08/08/2018  Today's Provider: Fredderick Severance, NP   No chief complaint on file.   Subjective:    HPI [name] is a 67 y.o. female who presents today for her Subsequent Annual Wellness Visit.  Patient/Caregiver input:    Past Medical History:  Diagnosis Date  . Chronic kidney disease   . Depression   . Heart attack   . Heart attack   . Hepatitis C   . Hyperlipidemia   . Hypertension   . 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 Stroke [facility])     Past Surgical History:  Procedure Laterality Date  . ABDOMINAL HYSTERECTOMY    . COLON SURGERY    . OVARIAN CYST REMOVAL    . TONSILLECTOMY      Family History  Problem Relation Age of Onset  . Hypertension Mother   . Hypertension Father   . Hypertension Sister   . Hypertension Brother     Social History   Socioeconomic History  . Marital status: Divorced    Spouse name: Not on file  . Number of children: Not on file  . Years of education: Not on file  . Highest education level: Not on file  Occupational History  . Not on file  Social Needs  . Financial resource strain: Not on file  . Food insecurity:    Worry: Not on file    Inability: Not on file  . Transportation needs:    Medical: Not on file    Non-medical: Not on file  Tobacco Use  . Smoking status: Former Research scientist (life sciences)  . Smokeless tobacco: Never Used  Substance and Sexual Activity  . Alcohol use: No    Alcohol/week: 0.0 standard drinks  . Drug use: No  . Sexual activity: Not Currently  Lifestyle  . Physical activity:    Days per week: Not on file    Minutes per session: Not on file  . Stress: Not on file  Relationships  . Social connections:    Talks on phone: Not on file    Gets together: Not on file    Attends religious service: Not on file    Active member of club or organization: Not on file    Attends meetings of clubs or organizations:  Not on file    Relationship status: Not on file  . Intimate partner violence:    Fear of current or ex partner: Not on file    Emotionally abused: Not on file    Physically abused: Not on file    Forced sexual activity: Not on file  Other Topics Concern  . Not on file  Social History Narrative  . Not on file    Outpatient Encounter Medications as of 08/09/2018  Medication Sig  . albuterol (VENTOLIN HFA) 108 (90 Base) MCG/ACT inhaler Inhale 2 puffs into the lungs every 4 (four) hours as needed for wheezing or shortness of breath.  Marland Kitchen aspirin EC 81 MG tablet Take 81 mg by mouth 2 (two) times daily.  Marland Kitchen atenolol (TENORMIN) 50 MG tablet Take 1 tablet (50 mg total) by mouth daily.  . beclomethasone (QVAR) 80 MCG/ACT inhaler Inhale 2 puffs into the lungs 2 (two) times daily.  Marland Kitchen BLACK WALNUT POLLEN [place] Inject 1 tablet into the skin once.  . Cholecalciferol (VITAMIN D) 2000 units tablet Take 2,000 Units by mouth 3 days.  . Cyanocobalamin (VITAMIN B-12)  King 1 tablet under the tongue 3 days.  . fexofenadine (ALLEGRA) 180 MG tablet Take 180 mg by mouth daily.  Marland Kitchen FLUoxetine HCl 60 MG TABS Take 1 tablet by mouth daily. Labs and appt needed for further refills  . fluticasone (FLONASE) 50 MCG/ACT nasal spray USE 2 SPRAYS DAILY PER NOSTRIL  . GARLIC OIL PO Take 1 pg/oz/day by mouth once.  . hydrochlorothiazide (HYDRODIURIL) 25 MG tablet TAKE 1 TABLET BY MOUTH DAILY  . meclizine (ANTIVERT) 25 MG tablet Take 1 tablet (25 mg total) by mouth 3 (three) times daily as needed for dizziness.  . Milk Thistle 250 MG CAPS Take 1 capsule by mouth once.  . Misc Natural Products (DANDELION ROOT PO) Take 1 tablet by mouth once.  . montelukast (SINGULAIR) 10 MG tablet Take 1 tablet (10 mg total) by mouth at bedtime.  . nitroGLYCERIN (NITROSTAT) 0.4 MG SL tablet Place 1 tablet (0.4 mg total) under the tongue every 5 (five) minutes as needed for chest pain. Call 911  . TURMERIC PO Take 1 tablet by mouth  once.   No facility-administered encounter medications on file as of 08/09/2018.     Allergies  Allergen Reactions  . Aspirin Other (See Comments)    Tongue swelling  . Penicillins   . Statins     Care Team Updated in EHR: {Yes/No:30480221}  Last Vision Exam: *** Wears corrective lenses: {Yes/No:30480221} Last Dental Exam: *** Last Hearing Exam: *** Wears Hearing Aids: {Yes/No:30480221}  Functional Ability / Safety Screening 1.  Was the timed Get Up and Go test shorter than 30 seconds?  {Blank single:19197::"yes","no"} 2.  Does the patient need help with the phone, transportation, shopping,      preparing meals, housework, laundry, medications, or managing money?  {Blank single:19197::"yes","no"} 3.  Is the patient's home free of loose throw rugs in walkways, pet beds, electrical cords, etc?   {Blank single:19197::"yes","no"}      Grab bars in the bathroom? {Blank single:19197::"yes","no"}      Handrails on the stairs?   {Blank single:19197::"yes","no"}      Adequate lighting?   {Blank single:19197::"yes","no"} 4.  Has the patient noticed any hearing difficulties?   {Blank single:19197::"yes","no"}  Diet Recall and Exercise Regimen: ***  Advanced Care Planning: A voluntary discussion about advance care planning including the explanation and discussion of advance directives.  Discussed health care proxy and Living will, and the patient was able to identify a health care proxy as ***.  Patient {DOES_DOES HKV:42595} have a living will at present time. If patient does have living will, I have requested they bring this to the clinic to be scanned in to their chart. Does patient have a HCPOA?    {Blank single:19197::"yes","no"} If yes, name and contact information:  Does patient have a living will or MOST form?  {Blank single:19197::"yes","no"}  Cancer Screenings: Lung: *** Low Dose CT Chest recommended if Age 28-80 years, 30 pack-year currently smoking OR have quit w/in 15years.  Patient {DOES NOT does:27190::"does not"} qualify. Breast: *** Up to date on Mammogram? {Yes/No:30480221}  Up to date of Bone Density/Dexa? {Yes/No:30480221} Colorectal: ***  Additional Screenings: *** Hepatitis B/HIV/Syphillis: Hepatitis C Screening:  Intimate Partner Violence:   Objective:   Vitals: There were no vitals taken for this visit. There is no height or weight on file to calculate BMI.  No exam data present  Cognitive Testing - 6-CIT  Correct? Score   What year is it? {YES NO:22349} {Numbers; 0-4:31231} Yes = 0  No = 4  What month is it? {YES NO:22349} {Numbers; 0-4:31231} Yes = 0    No = 3  Remember:     Pia Mau, [place], [place]     What time is it? {YES NO:22349} {Numbers; 0-4:31231} Yes = 0    No = 3  Count backwards from 20 to 1 {YES NO:22349} {Numbers; 0-4:31231} Correct = 0    1 error = 2   More than 1 error = 4  Say the months of the year in reverse. {YES NO:22349} {Numbers; 0-4:31231} Correct = 0    1 error = 2   More than 1 error = 4  What address did I ask you to remember? {YES NO:22349} {NUMBERS; 0-10:5044} Correct = 0  1 error = 2    2 error = 4    3 error = 6    4 error = 8    All wrong = 10       TOTAL SCORE  {Numbers; 6-16:07371}/06   Interpretation:  {Desc; normal/abnormal:11317::"Normal"}  Normal (0-7) Abnormal (8-28)   Fall Risk: Fall Risk  03/24/2016  Falls in the past year? Yes  Number falls in past yr: 2 or more  Injury with Fall? Yes  Risk Factor Category  High Fall Risk  Risk for fall due to : History of fall(s);Impaired vision    Depression Screen Depression screen [facility] 2/9 03/24/2016  Decreased Interest 1  Down, Depressed, Hopeless 1  PHQ - 2 Score 2  Altered sleeping 0  Tired, decreased energy 1  Change in appetite 1  Feeling bad or failure about yourself  0  Trouble concentrating 1  Moving slowly or fidgety/restless 1  Suicidal thoughts 0  PHQ-9 Score 6  Difficult doing work/chores Somewhat difficult    No  results found for this or any previous visit (from the past 2160 hour(s)).  Assessment & Plan:    There are no diagnoses linked to this encounter. *** type dotphrase "dot" diagmed to refresh this list  Exercise Activities and Dietary recommendations Goals   None    - Discussed health benefits of physical activity, and encouraged her to engage in regular exercise appropriate for her age and condition.    There is no immunization history on file for this patient.  Health Maintenance  Topic Date Due  . TETANUS/TDAP  12/29/1968  . MAMMOGRAM  12/30/1999  . COLONOSCOPY  12/30/1999  . DEXA SCAN  12/30/2014  . PNA vac Low Risk Adult (1 of 2 - PCV13) 12/30/2014  . INFLUENZA VACCINE  05/31/2018  . Hepatitis C Screening  Completed    No orders of the defined types were placed in this encounter.   Current Outpatient Medications:  .  albuterol (VENTOLIN HFA) 108 (90 Base) MCG/ACT inhaler, Inhale 2 puffs into the lungs every 4 (four) hours as needed for wheezing or shortness of breath., Disp: 1 Inhaler, Rfl: 0 .  aspirin EC 81 MG tablet, Take 81 mg by mouth 2 (two) times daily., Disp: , Rfl:  .  atenolol (TENORMIN) 50 MG tablet, Take 1 tablet (50 mg total) by mouth daily., Disp: 90 tablet, Rfl: 0 .  beclomethasone (QVAR) 80 MCG/ACT inhaler, Inhale 2 puffs into the lungs 2 (two) times daily., Disp: , Rfl:  .  BLACK WALNUT POLLEN [place], Inject 1 tablet into the skin once., Disp: , Rfl:  .  Cholecalciferol (VITAMIN D) 2000 units tablet, Take 2,000 Units by mouth 3 days., Disp: , Rfl:  .  Cyanocobalamin (VITAMIN B-12) 5000 MCG SUBL, Place 1 tablet under the tongue 3 days., Disp: , Rfl:  .  fexofenadine (ALLEGRA) 180 MG tablet, Take 180 mg by mouth daily., Disp: , Rfl:  .  FLUoxetine HCl 60 MG TABS, Take 1 tablet by mouth daily. Labs and appt needed for further refills, Disp: 30 tablet, Rfl: 0 .  fluticasone (FLONASE) 50 MCG/ACT nasal spray, USE 2 SPRAYS DAILY PER NOSTRIL, Disp: 16 g, Rfl: 12 .   GARLIC OIL PO, Take 1 pg/oz/day by mouth once., Disp: , Rfl:  .  hydrochlorothiazide (HYDRODIURIL) 25 MG tablet, TAKE 1 TABLET BY MOUTH DAILY, Disp: 30 tablet, Rfl: 0 .  meclizine (ANTIVERT) 25 MG tablet, Take 1 tablet (25 mg total) by mouth 3 (three) times daily as needed for dizziness., Disp: 60 tablet, Rfl: 5 .  Milk Thistle 250 MG CAPS, Take 1 capsule by mouth once., Disp: , Rfl:  .  Misc Natural Products (DANDELION ROOT PO), Take 1 tablet by mouth once., Disp: , Rfl:  .  montelukast (SINGULAIR) 10 MG tablet, Take 1 tablet (10 mg total) by mouth at bedtime., Disp: 90 tablet, Rfl: 1 .  nitroGLYCERIN (NITROSTAT) 0.4 MG SL tablet, Place 1 tablet (0.4 mg total) under the tongue every 5 (five) minutes as needed for chest pain. Call 911, Disp: 25 tablet, Rfl: 0 .  TURMERIC PO, Take 1 tablet by mouth once., Disp: , Rfl:  There are no discontinued medications.  I have personally reviewed and addressed the Medicare Annual Wellness health risk assessment questionnaire and have noted the following in the patient's chart:  A.         Medical and social history & family history B.         Use of alcohol, tobacco, and illicit drugs  C.         Current medications and supplements D.         Functional and Cognitive ability and status E.         Nutritional status F.         Physical activity G.        Advance directives H.         List of other physicians I.          Hospitalizations, surgeries, and ER visits in previous 12 months J.         [place] such as hearing, vision, cognitive function, and depression L.         Referrals and appointments: ***  In addition, I have reviewed and discussed with patient certain preventive protocols, quality metrics, and best practice recommendations. A written personalized care plan for preventive services as well as general preventive health recommendations were provided to patient.  See attached scanned questionnaire for additional  information.   No follow-ups on file. *** refresh to show

## 2018-08-08 NOTE — Telephone Encounter (Signed)
LVM for pt to call the office to R/S this appt.

## 2018-08-08 NOTE — Telephone Encounter (Signed)
Can you please reschedule this pt for tomorrow for a medicare wellness with ammie, thanks

## 2018-08-09 ENCOUNTER — Ambulatory Visit: Payer: Federal, State, Local not specified - PPO | Admitting: Nurse Practitioner

## 2018-08-13 ENCOUNTER — Ambulatory Visit: Payer: Federal, State, Local not specified - PPO | Admitting: Nurse Practitioner

## 2018-09-17 ENCOUNTER — Ambulatory Visit (INDEPENDENT_AMBULATORY_CARE_PROVIDER_SITE_OTHER): Payer: Medicare Other | Admitting: Family Medicine

## 2018-09-17 ENCOUNTER — Encounter: Payer: Self-pay | Admitting: Family Medicine

## 2018-09-17 VITALS — BP 120/80 | HR 75 | Temp 98.6°F | Resp 16 | Ht 60.0 in | Wt 161.6 lb

## 2018-09-17 DIAGNOSIS — R635 Abnormal weight gain: Secondary | ICD-10-CM

## 2018-09-17 DIAGNOSIS — I252 Old myocardial infarction: Secondary | ICD-10-CM

## 2018-09-17 DIAGNOSIS — M810 Age-related osteoporosis without current pathological fracture: Secondary | ICD-10-CM | POA: Diagnosis not present

## 2018-09-17 DIAGNOSIS — I10 Essential (primary) hypertension: Secondary | ICD-10-CM

## 2018-09-17 DIAGNOSIS — E782 Mixed hyperlipidemia: Secondary | ICD-10-CM

## 2018-09-17 DIAGNOSIS — Z1211 Encounter for screening for malignant neoplasm of colon: Secondary | ICD-10-CM | POA: Diagnosis not present

## 2018-09-17 DIAGNOSIS — H8103 Meniere's disease, bilateral: Secondary | ICD-10-CM

## 2018-09-17 DIAGNOSIS — J4530 Mild persistent asthma, uncomplicated: Secondary | ICD-10-CM

## 2018-09-17 DIAGNOSIS — Z23 Encounter for immunization: Secondary | ICD-10-CM

## 2018-09-17 DIAGNOSIS — B182 Chronic viral hepatitis C: Secondary | ICD-10-CM

## 2018-09-17 DIAGNOSIS — Z5181 Encounter for therapeutic drug level monitoring: Secondary | ICD-10-CM

## 2018-09-17 MED ORDER — NITROGLYCERIN 0.4 MG SL SUBL: 0.4000 mg | SUBLINGUAL_TABLET | SUBLINGUAL | 0 refills | Status: DC | PRN

## 2018-09-17 MED ORDER — BECLOMETHASONE DIPROPIONATE 80 MCG/ACT IN AERS: 2.0000 | INHALATION_SPRAY | Freq: Two times a day (BID) | RESPIRATORY_TRACT | 11 refills | Status: DC

## 2018-09-17 MED ORDER — MONTELUKAST SODIUM 10 MG PO TABS: 10.0000 mg | ORAL_TABLET | Freq: Every day | ORAL | 11 refills | Status: DC

## 2018-09-17 MED ORDER — ATENOLOL 50 MG PO TABS: 50.0000 mg | ORAL_TABLET | Freq: Every day | ORAL | 0 refills | Status: DC

## 2018-09-17 MED ORDER — FEXOFENADINE HCL 180 MG PO TABS: 180.0000 mg | ORAL_TABLET | Freq: Every day | ORAL | 11 refills | Status: DC

## 2018-09-17 MED ORDER — HYDROCHLOROTHIAZIDE 25 MG PO TABS: 25.0000 mg | ORAL_TABLET | Freq: Every day | ORAL | 5 refills | Status: DC

## 2018-09-17 MED ORDER — ALBUTEROL SULFATE HFA 108 (90 BASE) MCG/ACT IN AERS: 2.0000 | INHALATION_SPRAY | RESPIRATORY_TRACT | 1 refills | Status: DC | PRN

## 2018-09-17 MED ORDER — FLUOXETINE HCL 60 MG PO TABS: 1.0000 | ORAL_TABLET | Freq: Every day | ORAL | 5 refills | Status: DC

## 2018-09-17 NOTE — Assessment & Plan Note (Signed)
Does not show up in the blood she says; will check quant today

## 2018-09-17 NOTE — Progress Notes (Signed)
BP 120/80 (BP Location: Left Arm, Patient Position: Sitting, Cuff Size: Normal)   Pulse 75   Temp 98.6 F (37 C) (Oral)   Resp 16   Ht 5' (1.524 m)   Wt 161 lb 9.6 oz (73.3 kg)   SpO2 97%   BMI 31.56 kg/m    Subjective:    Patient ID: Tricia Vasquez, female    DOB: [birth year], 67 y.o.   MRN: [id number]  HPI: Tricia Vasquez is a 67 y.o. female  Chief Complaint  Patient presents with  . Medication Management    HPI Patient is here for f/u Back from [place] Had a mammogram in the spring; no problems; no lumps or bumps Hx of myocardial infarction; no ongoing chest pain; she does not have a local cardiologist High cholesterol; she cannot take statins; make her sick on stomach, throw up, get ill; tried two and did not want to try any more; lovastatin is one and pravastatin was the other; eats lots of garlic  She has Meniere's disease; that was diagnosed by "a guy in [place]", he retired; she got dizzy and stood up and went to turn and felt like something hit her back in the chair; sat down; last 20 minutes; had to get up and walk slowly and felt nauseated  Osteoporosis; ordering DEXA today; not many greens recently; loves milk and ice cream; drinks whole milk; lots of work in the [employer], so busy and active; fell and broke her leg in 2007, and that's when they discovered her bones were thin  Allergies and asthma; on Qvar and allegra, flonase; using rescue inhaler daily  Stage 1 CKD; not many NSAIDs; "I stay off of 'em"  She has hepatitis C  Depression; chronic depression; started out as "clinical depression"; sometimes it gets worse; wants to continue therapy; no SI/HI;   Depression screen [facility] 2/9 09/17/2018 03/24/2016  Decreased Interest 2 1  Down, Depressed, Hopeless 2 1  PHQ - 2 Score 4 2  Altered sleeping 1 0  Tired, decreased energy 3 1  Change in appetite 0 1  Feeling bad or failure about yourself  0 0  Trouble concentrating 0 1  Moving slowly or  fidgety/restless 3 1  Suicidal thoughts 0 0  PHQ-9 Score 11 6  Difficult doing work/chores Not difficult at all Somewhat difficult   Fall Risk  09/17/2018 03/24/2016  Falls in the past year? 1 Yes  Number falls in past yr: 1 2 or more  Injury with Fall? 0 Yes  Risk Factor Category  - High Fall Risk  Risk for fall due to : - History of fall(s);Impaired vision    Relevant past medical, surgical, family and social history reviewed Past Medical History:  Diagnosis Date  . Chronic kidney disease   . Depression   . Heart attack ([place])   . Heart attack ([place])   . Hepatitis C   . Hyperlipidemia   . Hypertension   . 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 Stroke [facility])    Past Surgical History:  Procedure Laterality Date  . ABDOMINAL HYSTERECTOMY    . COLON SURGERY    . OVARIAN CYST REMOVAL    . TONSILLECTOMY     Family History  Problem Relation Age of Onset  . Hypertension Mother   . Hypertension Father   . Hypertension Sister   . Hypertension Brother    Social History   Tobacco Use  . Smoking status: Former Research scientist (life sciences)  . Smokeless tobacco:  Never Used  Substance Use Topics  . Alcohol use: No    Alcohol/week: 0.0 standard drinks  . Drug use: No     Interim medical history since last visit reviewed. Allergies and medications reviewed  Review of Systems Per HPI unless specifically indicated above     Objective:    BP 120/80 (BP Location: Left Arm, Patient Position: Sitting, Cuff Size: Normal)   Pulse 75   Temp 98.6 F (37 C) (Oral)   Resp 16   Ht 5' (1.524 m)   Wt 161 lb 9.6 oz (73.3 kg)   SpO2 97%   BMI 31.56 kg/m   Wt Readings from Last 3 Encounters:  09/17/18 161 lb 9.6 oz (73.3 kg)  03/24/16 146 lb 1.6 oz (66.3 kg)    Physical Exam  Constitutional: She appears well-developed and well-nourished. No distress.  HENT:  Head: Normocephalic and atraumatic.  Eyes: EOM are normal. No scleral icterus.  Neck: No thyromegaly present.  Cardiovascular: Normal  rate, regular rhythm and normal heart sounds.  No murmur heard. Pulmonary/Chest: Effort normal and breath sounds normal. No respiratory distress. She has no wheezes.  Abdominal: Soft. Bowel sounds are normal. She exhibits no distension.  Musculoskeletal: She exhibits no edema.  Neurological: She is alert.  Skin: Skin is warm and dry. She is not diaphoretic. No pallor.  Psychiatric: She has a normal mood and affect. Her behavior is normal. Judgment and thought content normal.      Assessment & Plan:   Problem List Items Addressed This Visit      Cardiovascular and Mediastinum   BP (high blood pressure)    Well-controlled      Relevant Medications   nitroGLYCERIN (NITROSTAT) 0.4 MG SL tablet   atenolol (TENORMIN) 50 MG tablet   hydrochlorothiazide (HYDRODIURIL) 25 MG tablet     Respiratory   Asthma without status asthmaticus    Encouraged pt to get on the QVAR regularly; goal is to use rescue inhaler no more than 2x a week; call me if not improving      Relevant Medications   albuterol (VENTOLIN HFA) 108 (90 Base) MCG/ACT inhaler   beclomethasone (QVAR) 80 MCG/ACT inhaler   montelukast (SINGULAIR) 10 MG tablet     Digestive   HCV (hepatitis C virus)    Does not show up in the blood she says; will check quant today      Relevant Orders   COMPLETE METABOLIC PANEL WITH GFR (Completed)   Hepatitis C RNA quantitative (Completed)   INR/PT (Completed)     Nervous and Auditory   Meniere's disease of both ears    Refer to ENT      Relevant Orders   Ambulatory referral to ENT     Musculoskeletal and Integument   Osteoporosis, post-menopausal - Primary   Relevant Orders   DG Bone Density   VITAMIN D 25 Hydroxy (Vit-D Deficiency, Fractures) (Completed)     Other   Hx of myocardial infarction    Refer to cardiology; no current chest pain; refilled the NTG; cannot tolerate statin; refill atenolol      Relevant Orders   Ambulatory referral to Cardiology   Lipid panel  (Completed)   HLD (hyperlipidemia)    Check lipids today; encouraged limiting saturated fats; she cannot tolerate statin; consider zetia based on numbers      Relevant Medications   nitroGLYCERIN (NITROSTAT) 0.4 MG SL tablet   atenolol (TENORMIN) 50 MG tablet   hydrochlorothiazide (HYDRODIURIL) 25 MG tablet  Other Relevant Orders   Ambulatory referral to Cardiology   Lipid panel (Completed)    Other Visit Diagnoses    Screen for colon cancer       Relevant Orders   Ambulatory referral to Gastroenterology   Need for Tdap vaccination       Relevant Orders   Tdap vaccine greater than or equal to 7yo IM   Weight gain       Relevant Orders   TSH (Completed)   Medication monitoring encounter       Relevant Orders   CBC with Differential/Platelet (Completed)       Follow up plan: Return in about 3 months (around 12/18/2018) for follow-up visit with Dr. Sanda Klein (or just after); Medicare wellness when due.  An after-visit summary was printed and given to the patient at [place].  Please see the patient instructions which may contain other information and recommendations beyond what is mentioned above in the assessment and plan.  Meds ordered this encounter  Medications  . nitroGLYCERIN (NITROSTAT) 0.4 MG SL tablet    Sig: Place 1 tablet (0.4 mg total) under the tongue every 5 (five) minutes as needed for chest pain. Call 911; max 3 per episode    Dispense:  25 tablet    Refill:  0  . atenolol (TENORMIN) 50 MG tablet    Sig: Take 1 tablet (50 mg total) by mouth daily.    Dispense:  90 tablet    Refill:  0  . albuterol (VENTOLIN HFA) 108 (90 Base) MCG/ACT inhaler    Sig: Inhale 2 puffs into the lungs every 4 (four) hours as needed for wheezing or shortness of breath.    Dispense:  1 Inhaler    Refill:  1  . beclomethasone (QVAR) 80 MCG/ACT inhaler    Sig: Inhale 2 puffs into the lungs 2 (two) times daily.    Dispense:  1 Inhaler    Refill:  11  . fexofenadine (ALLEGRA) 180 MG  tablet    Sig: Take 1 tablet (180 mg total) by mouth daily.    Dispense:  30 tablet    Refill:  11  . FLUoxetine HCl 60 MG TABS    Sig: Take 1 tablet by mouth daily. Labs and appt needed for further refills    Dispense:  30 tablet    Refill:  5  . hydrochlorothiazide (HYDRODIURIL) 25 MG tablet    Sig: Take 1 tablet (25 mg total) by mouth daily.    Dispense:  30 tablet    Refill:  5    Labs are needed for any further refills; thank you; 30 days only  . montelukast (SINGULAIR) 10 MG tablet    Sig: Take 1 tablet (10 mg total) by mouth at bedtime.    Dispense:  30 tablet    Refill:  11    Orders Placed This Encounter  Procedures  . DG Bone Density  . Tdap vaccine greater than or equal to 7yo IM  . CBC with Differential/Platelet  . COMPLETE METABOLIC PANEL WITH GFR  . Lipid panel  . TSH  . Hepatitis C RNA quantitative  . VITAMIN D 25 Hydroxy (Vit-D Deficiency, Fractures)  . INR/PT  . Ambulatory referral to Gastroenterology  . Ambulatory referral to Cardiology  . Ambulatory referral to ENT

## 2018-09-17 NOTE — Patient Instructions (Addendum)
Try to limit saturated fats in your diet (bologna, hot dogs, barbeque, cheeseburgers, hamburgers, steak, bacon, sausage, cheese, etc.) and get more fresh fruits, vegetables, and whole grains  I will suggest oat milk or almond milk Do not use soy milk  Get back on the QVAR regularly, two puffs twice a day every day

## 2018-09-17 NOTE — Assessment & Plan Note (Signed)
Check lipids today; encouraged limiting saturated fats; she cannot tolerate statin; consider zetia based on numbers

## 2018-09-17 NOTE — Assessment & Plan Note (Signed)
Refer to cardiology; no current chest pain; refilled the NTG; cannot tolerate statin; refill atenolol

## 2018-09-17 NOTE — Assessment & Plan Note (Signed)
Encouraged pt to get on the QVAR regularly; goal is to use rescue inhaler no more than 2x a week; call me if not improving

## 2018-09-17 NOTE — Assessment & Plan Note (Signed)
Well controlled 

## 2018-09-17 NOTE — Assessment & Plan Note (Signed)
Refer to ENT

## 2018-09-18 ENCOUNTER — Other Ambulatory Visit: Payer: Self-pay

## 2018-09-18 DIAGNOSIS — Z1211 Encounter for screening for malignant neoplasm of colon: Secondary | ICD-10-CM

## 2018-09-18 MED ORDER — NA SULFATE-K SULFATE-MG SULF 17.5-3.13-1.6 GM/177ML PO SOLN: 1.0000 | Freq: Once | ORAL | 0 refills | Status: AC

## 2018-09-19 ENCOUNTER — Telehealth: Payer: Self-pay

## 2018-09-19 LAB — LIPID PANEL
CHOL/HDL RATIO: 5.9 (calc) — AB (ref <5.0)
Cholesterol: 236 mg/dL — ABNORMAL HIGH (ref <200)
HDL: 40 mg/dL — ABNORMAL LOW (ref >50)
LDL CHOLESTEROL (CALC): 164 mg/dL — AB
Non-HDL Cholesterol (Calc): 196 mg/dL (calc) — ABNORMAL HIGH (ref <130)
TRIGLYCERIDES: 168 mg/dL — AB (ref <150)

## 2018-09-19 LAB — CBC WITH DIFFERENTIAL/PLATELET
BASOS PCT: 0.8 %
Basophils Absolute: 52 cells/uL (ref 0–200)
EOS PCT: 3.7 %
Eosinophils Absolute: 241 cells/uL (ref 15–500)
HCT: 40.8 % (ref 35.0–45.0)
Hemoglobin: 13.9 g/dL (ref 11.7–15.5)
Lymphs Abs: 2009 cells/uL (ref 850–3900)
MCH: 30.1 pg (ref 27.0–33.0)
MCHC: 34.1 g/dL (ref 32.0–36.0)
MCV: 88.3 fL (ref 80.0–100.0)
MONOS PCT: 11.2 %
MPV: 11.4 fL (ref 7.5–12.5)
NEUTROS ABS: 3471 {cells}/uL (ref 1500–7800)
Neutrophils Relative %: 53.4 %
PLATELETS: 261 10*3/uL (ref 140–400)
RBC: 4.62 10*6/uL (ref 3.80–5.10)
RDW: 12.7 % (ref 11.0–15.0)
TOTAL LYMPHOCYTE: 30.9 %
WBC mixed population: 728 cells/uL (ref 200–950)
WBC: 6.5 10*3/uL (ref 3.8–10.8)

## 2018-09-19 LAB — COMPLETE METABOLIC PANEL WITH GFR
AG Ratio: 1.8 (calc) (ref 1.0–2.5)
ALBUMIN MSPROF: 4.4 g/dL (ref 3.6–5.1)
ALKALINE PHOSPHATASE (APISO): 94 U/L (ref 33–130)
ALT: 49 U/L — AB (ref 6–29)
AST: 34 U/L (ref 10–35)
BILIRUBIN TOTAL: 0.6 mg/dL (ref 0.2–1.2)
BUN / CREAT RATIO: 15 (calc) (ref 6–22)
BUN: 17 mg/dL (ref 7–25)
CO2: 27 mmol/L (ref 20–32)
CREATININE: 1.17 mg/dL — AB (ref 0.50–0.99)
Calcium: 9.4 mg/dL (ref 8.6–10.4)
Chloride: 98 mmol/L (ref 98–110)
GFR, Est African American: 55 mL/min/{1.73_m2} — ABNORMAL LOW (ref >=60)
GFR, Est Non African American: 48 mL/min/{1.73_m2} — ABNORMAL LOW (ref >=60)
GLUCOSE: 82 mg/dL (ref 65–139)
Globulin: 2.5 g/dL (calc) (ref 1.9–3.7)
Potassium: 3.7 mmol/L (ref 3.5–5.3)
Sodium: 137 mmol/L (ref 135–146)
TOTAL PROTEIN: 6.9 g/dL (ref 6.1–8.1)

## 2018-09-19 LAB — VITAMIN D 25 HYDROXY (VIT D DEFICIENCY, FRACTURES): Vit D, 25-Hydroxy: 42 ng/mL (ref 30–100)

## 2018-09-19 LAB — HEPATITIS C RNA QUANTITATIVE
HCV QUANT LOG: NOT DETECTED {Log_IU}/mL
HCV RNA, PCR, QN: NOT DETECTED [IU]/mL

## 2018-09-19 LAB — PROTIME-INR
INR: 0.9
PROTHROMBIN TIME: 9.8 s (ref 9.0–11.5)

## 2018-09-19 LAB — TSH: TSH: 1.32 mIU/L (ref 0.40–4.50)

## 2018-09-19 NOTE — Telephone Encounter (Signed)
LVM for pt to call office back regarding her colonoscopy date.  I scheduled her for 09/21/18  With Dr. Allen Norris but date has been changed.  I called to confirm that she requested date change for procedure.  Thanks [employer]

## 2018-09-19 NOTE — Discharge Instructions (Signed)
General Anesthesia, Adult, Care After °These instructions provide you with information about caring for yourself after your procedure. Your health care provider may also give you more specific instructions. Your treatment has been planned according to current medical practices, but problems sometimes occur. Call your health care provider if you have any problems or questions after your procedure. °What can I expect after the procedure? °After the procedure, it is common to have: °· Vomiting. °· A sore throat. °· Mental slowness. ° °It is common to feel: °· Nauseous. °· Cold or shivery. °· Sleepy. °· Tired. °· Sore or achy, even in parts of your body where you did not have surgery. ° °Follow these instructions at home: °For at least 24 hours after the procedure: °· Do not: °? Participate in activities where you could fall or become injured. °? Drive. °? Use heavy machinery. °? Drink alcohol. °? Take sleeping pills or medicines that cause drowsiness. °? Make important decisions or sign legal documents. °? Take care of children on your own. °· Rest. °Eating and drinking °· If you vomit, drink water, juice, or soup when you can drink without vomiting. °· Drink enough fluid to keep your urine clear or pale yellow. °· Make sure you have little or no nausea before eating solid foods. °· Follow the diet recommended by your health care provider. °General instructions °· Have a responsible adult stay with you until you are awake and alert. °· Return to your normal activities as told by your health care provider. Ask your health care provider what activities are safe for you. °· Take over-the-counter and prescription medicines only as told by your health care provider. °· If you smoke, do not smoke without supervision. °· Keep all follow-up visits as told by your health care provider. This is important. °Contact a health care provider if: °· You continue to have nausea or vomiting at home, and medicines are not helpful. °· You  cannot drink fluids or start eating again. °· You cannot urinate after 8-12 hours. °· You develop a skin rash. °· You have fever. °· You have increasing redness at the site of your procedure. °Get help right away if: °· You have difficulty breathing. °· You have chest pain. °· You have unexpected bleeding. °· You feel that you are having a life-threatening or urgent problem. °This information is not intended to replace advice given to you by your health care provider. Make sure you discuss any questions you have with your health care provider. °Document Released: 01/23/2001 Document Revised: 03/21/2016 Document Reviewed: 10/01/2015 °Elsevier Interactive Patient Education © 2018 [employer] ° °

## 2018-09-20 ENCOUNTER — Telehealth: Payer: Self-pay

## 2018-09-20 DIAGNOSIS — R740 Nonspecific elevation of levels of transaminase and lactic acid dehydrogenase [LDH]: Secondary | ICD-10-CM

## 2018-09-20 DIAGNOSIS — R7401 Elevation of levels of liver transaminase levels: Secondary | ICD-10-CM

## 2018-09-20 DIAGNOSIS — N289 Disorder of kidney and ureter, unspecified: Secondary | ICD-10-CM

## 2018-09-20 DIAGNOSIS — E785 Hyperlipidemia, unspecified: Secondary | ICD-10-CM

## 2018-09-20 NOTE — Telephone Encounter (Signed)
-----   Message from Arnetha Courser, MD sent at 09/19/2018  1:49 PM EST ----- Please let patient know that her CBC is normal Her kidney function is not as good as when she was 67 years old; stay hydrated, avoid NSAIDs; recheck BMP in 6 weeks (please ORDER, dx: renal insufficiency) One liver enzyme is up; we'll recheck in 6 weeks; please ORDER (hepatic function panel, dx: elevated SGPT/ALT) Cholesterol is high; start zetia and really work on healthy eating; recheck lipids in 6 weeks (please ORDER, dx: hyperlipidemia) Hep C is non-detectable, great news!

## 2018-09-21 ENCOUNTER — Telehealth: Payer: Self-pay

## 2018-09-21 DIAGNOSIS — N289 Disorder of kidney and ureter, unspecified: Secondary | ICD-10-CM

## 2018-09-21 DIAGNOSIS — E785 Hyperlipidemia, unspecified: Secondary | ICD-10-CM

## 2018-09-21 DIAGNOSIS — R740 Nonspecific elevation of levels of transaminase and lactic acid dehydrogenase [LDH]: Secondary | ICD-10-CM

## 2018-09-21 DIAGNOSIS — R7401 Elevation of levels of liver transaminase levels: Secondary | ICD-10-CM

## 2018-09-21 NOTE — Telephone Encounter (Signed)
-----   Message from Arnetha Courser, MD sent at 09/19/2018  1:49 PM EST ----- Please let patient know that her CBC is normal Her kidney function is not as good as when she was 67 years old; stay hydrated, avoid NSAIDs; recheck BMP in 6 weeks (please ORDER, dx: renal insufficiency) One liver enzyme is up; we'll recheck in 6 weeks; please ORDER (hepatic function panel, dx: elevated SGPT/ALT) Cholesterol is high; start zetia and really work on healthy eating; recheck lipids in 6 weeks (please ORDER, dx: hyperlipidemia) Hep C is non-detectable, great news!

## 2018-09-24 ENCOUNTER — Other Ambulatory Visit: Payer: Self-pay

## 2018-09-24 ENCOUNTER — Encounter: Payer: Self-pay | Admitting: *Deleted

## 2018-09-25 ENCOUNTER — Ambulatory Visit (INDEPENDENT_AMBULATORY_CARE_PROVIDER_SITE_OTHER): Payer: Medicare Other

## 2018-09-25 DIAGNOSIS — Z23 Encounter for immunization: Secondary | ICD-10-CM

## 2018-09-25 NOTE — Progress Notes (Signed)
tdap

## 2018-10-01 ENCOUNTER — Other Ambulatory Visit: Payer: Self-pay

## 2018-10-04 ENCOUNTER — Ambulatory Visit
Admission: RE | Admit: 2018-10-04 | Discharge: 2018-10-04 | Disposition: A | Discharge status: Home / Self Care | Payer: Medicare Other | Source: Ambulatory Visit | Attending: Gastroenterology | Admitting: Gastroenterology

## 2018-10-04 ENCOUNTER — Encounter
Admission: RE | Disposition: A | Discharge status: Home / Self Care | Payer: Self-pay | Source: Ambulatory Visit | Attending: Gastroenterology

## 2018-10-04 ENCOUNTER — Ambulatory Visit: Payer: Medicare Other | Admitting: Anesthesiology

## 2018-10-04 DIAGNOSIS — D123 Benign neoplasm of transverse colon: Secondary | ICD-10-CM

## 2018-10-04 DIAGNOSIS — J45909 Unspecified asthma, uncomplicated: Secondary | ICD-10-CM | POA: Diagnosis not present

## 2018-10-04 DIAGNOSIS — Z6831 Body mass index (BMI) 31.0-31.9, adult: Secondary | ICD-10-CM | POA: Diagnosis not present

## 2018-10-04 DIAGNOSIS — F329 Major depressive disorder, single episode, unspecified: Secondary | ICD-10-CM | POA: Insufficient documentation

## 2018-10-04 DIAGNOSIS — Z1211 Encounter for screening for malignant neoplasm of colon: Secondary | ICD-10-CM | POA: Diagnosis not present

## 2018-10-04 DIAGNOSIS — Z7951 Long term (current) use of inhaled steroids: Secondary | ICD-10-CM | POA: Insufficient documentation

## 2018-10-04 DIAGNOSIS — Z886 Allergy status to analgesic agent status: Secondary | ICD-10-CM | POA: Diagnosis not present

## 2018-10-04 DIAGNOSIS — Z87891 Personal history of nicotine dependence: Secondary | ICD-10-CM | POA: Insufficient documentation

## 2018-10-04 DIAGNOSIS — I129 Hypertensive chronic kidney disease with stage 1 through stage 4 chronic kidney disease, or unspecified chronic kidney disease: Secondary | ICD-10-CM | POA: Diagnosis not present

## 2018-10-04 DIAGNOSIS — Z8673 Personal history of transient ischemic attack (TIA), and cerebral infarction without residual deficits: Secondary | ICD-10-CM | POA: Diagnosis not present

## 2018-10-04 DIAGNOSIS — K640 First degree hemorrhoids: Secondary | ICD-10-CM | POA: Insufficient documentation

## 2018-10-04 DIAGNOSIS — I252 Old myocardial infarction: Secondary | ICD-10-CM | POA: Insufficient documentation

## 2018-10-04 DIAGNOSIS — Z888 Allergy status to other drugs, medicaments and biological substances status: Secondary | ICD-10-CM | POA: Insufficient documentation

## 2018-10-04 DIAGNOSIS — Z7982 Long term (current) use of aspirin: Secondary | ICD-10-CM | POA: Insufficient documentation

## 2018-10-04 DIAGNOSIS — D122 Benign neoplasm of ascending colon: Secondary | ICD-10-CM | POA: Diagnosis not present

## 2018-10-04 DIAGNOSIS — I2510 Atherosclerotic heart disease of native coronary artery without angina pectoris: Secondary | ICD-10-CM | POA: Insufficient documentation

## 2018-10-04 DIAGNOSIS — N181 Chronic kidney disease, stage 1: Secondary | ICD-10-CM | POA: Diagnosis not present

## 2018-10-04 DIAGNOSIS — Z880 Allergy status to penicillin: Secondary | ICD-10-CM | POA: Insufficient documentation

## 2018-10-04 DIAGNOSIS — Z79899 Other long term (current) drug therapy: Secondary | ICD-10-CM | POA: Insufficient documentation

## 2018-10-04 DIAGNOSIS — E785 Hyperlipidemia, unspecified: Secondary | ICD-10-CM | POA: Insufficient documentation

## 2018-10-04 HISTORY — PX: COLONOSCOPY WITH PROPOFOL: SHX5780

## 2018-10-04 HISTORY — DX: Sleep apnea, unspecified: G47.30

## 2018-10-04 HISTORY — DX: Age-related osteoporosis without current pathological fracture: M81.0

## 2018-10-04 HISTORY — PX: POLYPECTOMY: SHX5525

## 2018-10-04 SURGERY — COLONOSCOPY WITH PROPOFOL
Anesthesia: General

## 2018-10-04 MED ORDER — STERILE WATER FOR IRRIGATION IR SOLN
Status: DC | PRN
  Administered 2018-10-04: 11:00:00

## 2018-10-04 MED ORDER — PROPOFOL 10 MG/ML IV BOLUS
INTRAVENOUS | Status: DC | PRN
  Administered 2018-10-04: 20 mg via INTRAVENOUS
  Administered 2018-10-04: 30 mg via INTRAVENOUS
  Administered 2018-10-04: 20 mg via INTRAVENOUS
  Administered 2018-10-04: 40 mg via INTRAVENOUS
  Administered 2018-10-04 (×2): 30 mg via INTRAVENOUS
  Administered 2018-10-04: 120 mg via INTRAVENOUS

## 2018-10-04 MED ORDER — LACTATED RINGERS IV SOLN
INTRAVENOUS | Status: DC
  Administered 2018-10-04: 11:00:00 via INTRAVENOUS

## 2018-10-04 MED ORDER — LIDOCAINE HCL (CARDIAC) PF 100 MG/5ML IV SOSY
PREFILLED_SYRINGE | INTRAVENOUS | Status: DC | PRN
  Administered 2018-10-04: 40 mg via INTRAVENOUS

## 2018-10-04 MED ORDER — OXYCODONE HCL 5 MG PO TABS: 5.0000 mg | ORAL_TABLET | Freq: Once | ORAL | Status: DC | PRN

## 2018-10-04 MED ORDER — OXYCODONE HCL 5 MG/5ML PO SOLN: 5.0000 mg | Freq: Once | ORAL | Status: DC | PRN

## 2018-10-04 MED ORDER — ONDANSETRON HCL 4 MG/2ML IJ SOLN
INTRAMUSCULAR | Status: DC | PRN
  Administered 2018-10-04: 4 mg via INTRAVENOUS

## 2018-10-04 SURGICAL SUPPLY — 24 items
CANISTER SUCT 1200ML W/VALVE (MISCELLANEOUS) ×3 IMPLANT
CLIP HMST 235XBRD CATH ROT (MISCELLANEOUS) IMPLANT
CLIP RESOLUTION 360 11X235 (MISCELLANEOUS)
ELECT REM PT RETURN 9FT ADLT (ELECTROSURGICAL)
ELECTRODE REM PT RTRN 9FT ADLT (ELECTROSURGICAL) IMPLANT
FCP ESCP3.2XJMB 240X2.8X (MISCELLANEOUS) ×2
FORCEPS BIOP RAD 4 LRG CAP 4 (CUTTING FORCEPS) IMPLANT
FORCEPS BIOP RJ4 240 W/NDL (MISCELLANEOUS) ×1
FORCEPS ESCP3.2XJMB 240X2.8X (MISCELLANEOUS) ×2 IMPLANT
GOWN CVR UNV OPN BCK APRN NK (MISCELLANEOUS) ×4 IMPLANT
GOWN ISOL THUMB LOOP REG UNIV (MISCELLANEOUS) ×2
INJECTOR VARIJECT VIN23 (MISCELLANEOUS) IMPLANT
KIT DEFENDO VALVE AND CONN (KITS) IMPLANT
KIT ENDO PROCEDURE OLY (KITS) ×3 IMPLANT
MARKER SPOT ENDO TATTOO 5ML (MISCELLANEOUS) IMPLANT
PROBE APC STR FIRE (PROBE) IMPLANT
RETRIEVER NET ROTH 2.5X230 LF (MISCELLANEOUS) IMPLANT
SNARE SHORT THROW 13M SML OVAL (MISCELLANEOUS) IMPLANT
SNARE SHORT THROW 30M LRG OVAL (MISCELLANEOUS) IMPLANT
SNARE SNG USE RND 15MM (INSTRUMENTS) IMPLANT
SPOT EX ENDOSCOPIC TATTOO (MISCELLANEOUS)
TRAP ETRAP POLY (MISCELLANEOUS) IMPLANT
VARIJECT INJECTOR VIN23 (MISCELLANEOUS)
WATER STERILE IRR 250ML POUR (IV SOLUTION) ×3 IMPLANT

## 2018-10-04 NOTE — Op Note (Signed)
[facility] Patient Name: Tricia Vasquez Procedure Date: 10/04/2018 10:49 AM MRN: [id number] Account #: [id number] Date of Birth: 1951-05-15 Admit Type: Outpatient Age: 67 Room: [facility] OR ROOM 01 Gender: Female Note Status: Finalized Procedure:            Colonoscopy Indications:          Screening for colorectal malignant neoplasm Providers:            Lucilla Lame MD, MD Referring MD:         Arnetha Courser (Referring MD) Medicines:            Propofol per Anesthesia Complications:        No immediate complications. Procedure:            Pre-Anesthesia Assessment:                       - Prior to the procedure, a History and Physical was                        performed, and patient medications and allergies were                        reviewed. The patient's tolerance of previous                        anesthesia was also reviewed. The risks and benefits of                        the procedure and the sedation options and risks were                        discussed with the patient. All questions were                        answered, and informed consent was obtained. Prior                        Anticoagulants: The patient has taken no previous                        anticoagulant or antiplatelet agents. ASA Grade                        Assessment: II - A patient with mild systemic disease.                        After reviewing the risks and benefits, the patient was                        deemed in satisfactory condition to undergo the                        procedure.                       After obtaining informed consent, the colonoscope was                        passed under direct vision. Throughout the procedure,  the patient's blood pressure, pulse, and oxygen                        saturations were monitored continuously. The                        Colonoscope was introduced through the anus and         advanced to the the cecum, identified by                        transillumination. The colonoscopy was performed                        without difficulty. The patient tolerated the procedure                        well. The quality of the bowel preparation was                        excellent. Findings:      The perianal and digital rectal examinations were normal.      A 3 mm polyp was found in the transverse colon. The polyp was sessile.       The polyp was removed with a cold biopsy forceps. Resection and       retrieval were complete.      Two sessile polyps were found in the ascending colon. The polyps were 3       to 4 mm in size. These polyps were removed with a cold biopsy forceps.       Resection and retrieval were complete.      Non-bleeding internal hemorrhoids were found during retroflexion. The       hemorrhoids were Grade I (internal hemorrhoids that do not prolapse). Impression:           - One 3 mm polyp in the transverse colon, removed with                        a cold biopsy forceps. Resected and retrieved.                       - Two 3 to 4 mm polyps in the ascending colon, removed                        with a cold biopsy forceps. Resected and retrieved.                       - Non-bleeding internal hemorrhoids. Recommendation:       - Discharge patient to home.                       - Resume previous diet.                       - Continue present medications.                       - Await pathology results. Procedure Code(s):    --- Professional ---                       [phone], Colonoscopy, flexible; with biopsy, single or  multiple Diagnosis Code(s):    --- Professional ---                       Z12.11, Encounter for screening for malignant neoplasm                        of colon                       D12.3, Benign neoplasm of transverse colon (hepatic                        flexure or splenic flexure)                       D12.2,  Benign neoplasm of ascending colon CPT copyright 2018 American Medical Association. All rights reserved. The codes documented in this report are preliminary and upon coder review may  be revised to meet current compliance requirements. Lucilla Lame MD, MD 10/04/2018 11:11:11 AM This report has been signed electronically. Number of Addenda: 0 Note Initiated On: 10/04/2018 10:49 AM Scope Withdrawal Time: 0 hours 6 minutes 13 seconds  Total Procedure Duration: 0 hours 13 minutes 7 seconds       [facility]

## 2018-10-04 NOTE — Anesthesia Preprocedure Evaluation (Signed)
Anesthesia Evaluation  Patient identified by MRN, date of birth, ID band  Reviewed: NPO status   History of Anesthesia Complications Negative for: history of anesthetic complications  Airway Mallampati: II  TM Distance: >3 FB Neck ROM: full    Dental  (+) Chipped,    Pulmonary asthma , sleep apnea (no cpap) , former smoker,    Pulmonary exam normal        Cardiovascular Exercise Tolerance: Good hypertension, + CAD and + Past MI (x2, last 1999)  Normal cardiovascular exam     Neuro/Psych  Headaches, PSYCHIATRIC DISORDERS Depression Meniere's dz CVA (2013), No Residual Symptoms    GI/Hepatic negative GI ROS, Neg liver ROS, (+) Hepatitis -, C  Endo/Other  Morbid obesity (bmi=31)  Renal/GU Renal disease (ckd1)  negative genitourinary   Musculoskeletal   Abdominal   Peds  Hematology negative hematology ROS (+)   Anesthesia Other Findings echo: 2015: ef=65%;    Reproductive/Obstetrics                             Anesthesia Physical Anesthesia Plan  ASA: III  Anesthesia Plan: General   Post-op Pain Management:    Induction:   PONV Risk Score and Plan:   Airway Management Planned: Natural Airway  Additional Equipment:   Intra-op Plan:   Post-operative Plan:   Informed Consent: I have reviewed the patients History and Physical, chart, labs and discussed the procedure including the risks, benefits and alternatives for the proposed anesthesia with the patient or authorized representative who has indicated his/her understanding and acceptance.     Plan Discussed with: CRNA  Anesthesia Plan Comments:         Anesthesia Quick Evaluation

## 2018-10-04 NOTE — Anesthesia Postprocedure Evaluation (Signed)
Anesthesia Post Note  Patient: Tricia Vasquez  Procedure(s) Performed: COLONOSCOPY WITH PROPOFOL (N/A ) POLYPECTOMY  Patient location during evaluation: PACU Anesthesia Type: General Level of consciousness: awake and alert Pain management: pain level controlled Vital Signs Assessment: post-procedure vital signs reviewed and stable Respiratory status: spontaneous breathing, nonlabored ventilation, respiratory function stable and patient connected to nasal cannula oxygen Cardiovascular status: stable and blood pressure returned to baseline Postop Assessment: no apparent nausea or vomiting Anesthetic complications: no    Dereke Neumann

## 2018-10-04 NOTE — Anesthesia Procedure Notes (Addendum)
Date/Time: 10/04/2018 10:50 AM Performed by: Cameron Ali, CRNA Pre-anesthesia Checklist: Patient identified, Emergency Drugs available, Suction available, Timeout performed and Patient being monitored Patient Re-evaluated:Patient Re-evaluated prior to induction Oxygen Delivery Method: Nasal cannula Placement Confirmation: positive ETCO2

## 2018-10-04 NOTE — Transfer of Care (Signed)
Immediate Anesthesia Transfer of Care Note  Patient: Tricia Vasquez  Procedure(s) Performed: COLONOSCOPY WITH PROPOFOL (N/A ) POLYPECTOMY  Patient Location: PACU  Anesthesia Type: General  Level of Consciousness: awake, alert  and patient cooperative  Airway and Oxygen Therapy: Patient Spontanous Breathing and Patient connected to supplemental oxygen  Post-op Assessment: Post-op Vital signs reviewed, Patient's Cardiovascular Status Stable, Respiratory Function Stable, Patent Airway and No signs of Nausea or vomiting  Post-op Vital Signs: Reviewed and stable  Complications: No apparent anesthesia complications

## 2018-10-04 NOTE — H&P (Signed)
Lucilla Lame, MD [address] [place], [place] [address] Phone: [phone] Fax : [phone]  Primary Care Physician:  Arnetha Courser, MD Primary Gastroenterologist:  Dr. Allen Norris  Pre-Procedure History & Physical: HPI:  Tricia Vasquez is a 66 y.o. female is here for a screening colonoscopy.   Past Medical History:  Diagnosis Date  . Chronic kidney disease    stage 1  . Depression   . Heart attack ([place])    (x2) 1980s and 1990s  . Hepatitis C   . Hyperlipidemia   . Hypertension   . 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 Osteoporosis   . Sleep apnea    No CPAP  . Stroke [facility]) 2013    Past Surgical History:  Procedure Laterality Date  . ABDOMINAL HYSTERECTOMY    . COLON SURGERY    . OVARIAN CYST REMOVAL    . TONSILLECTOMY      Prior to Admission medications   Medication Sig Start Date End Date Taking? Authorizing Provider  albuterol (VENTOLIN HFA) 108 (90 Base) MCG/ACT inhaler Inhale 2 puffs into the lungs every 4 (four) hours as needed for wheezing or shortness of breath. 09/17/18  Yes Lada, Satira Anis, MD  aspirin EC 81 MG tablet Take 81 mg by mouth 2 (two) times daily.   Yes [provider]  atenolol (TENORMIN) 50 MG tablet Take 1 tablet (50 mg total) by mouth daily. 09/17/18  Yes Lada, Satira Anis, MD  beclomethasone (QVAR) 80 MCG/ACT inhaler Inhale 2 puffs into the lungs 2 (two) times daily. 09/17/18  Yes Lada, Satira Anis, MD  BLACK [place] POLLEN [place] Inject 1 tablet into the skin once.   Yes [provider]  Cholecalciferol (VITAMIN D) 2000 units tablet Take 2,000 Units by mouth 3 days.   Yes [provider]  Cyanocobalamin (VITAMIN B-12) 5000 MCG SUBL Place 1 tablet under the tongue 3 days.   Yes [provider]  fexofenadine (ALLEGRA) 180 MG tablet Take 1 tablet (180 mg total) by mouth daily. 09/17/18  Yes Lada, Satira Anis, MD  FLUoxetine HCl 60 MG TABS Take 1 tablet by mouth daily. Labs and appt needed for  further refills 09/17/18  Yes Lada, Satira Anis, MD  fluticasone (FLONASE) 50 MCG/ACT nasal spray USE 2 SPRAYS DAILY PER NOSTRIL 04/17/16  Yes Crissman, Jeannette How, MD  GARLIC OIL PO Take 1 pg/oz/day by mouth once.   Yes [provider]  hydrochlorothiazide (HYDRODIURIL) 25 MG tablet Take 1 tablet (25 mg total) by mouth daily. 09/17/18  Yes Lada, Satira Anis, MD  meclizine (ANTIVERT) 25 MG tablet Take 1 tablet (25 mg total) by mouth 3 (three) times daily as needed for dizziness. 03/24/16  Yes Lada, Satira Anis, MD  Milk Thistle 250 MG CAPS Take 1 capsule by mouth once.   Yes [provider]  Misc Natural Products (DANDELION ROOT PO) Take 1 tablet by mouth once.   Yes [provider]  montelukast (SINGULAIR) 10 MG tablet Take 1 tablet (10 mg total) by mouth at bedtime. 09/17/18  Yes Lada, Satira Anis, MD  nitroGLYCERIN (NITROSTAT) 0.4 MG SL tablet Place 1 tablet (0.4 mg total) under the tongue every 5 (five) minutes as needed for chest pain. Call 911; max 3 per episode 09/17/18  Yes Lada, Satira Anis, MD  TURMERIC PO Take 1 tablet by mouth once.   Yes [provider]  VITAMIN E PO Take by mouth daily.   Yes [provider]  Allergies as of 09/18/2018 - Review Complete 09/17/2018  Allergen Reaction Noted  . Aspirin Other (See Comments) 05/12/2015  . Penicillins  05/12/2015  . Statins  05/12/2015    Family History  Problem Relation Age of Onset  . Hypertension Mother   . Hypertension Father   . Hypertension Sister   . Hypertension Brother     Social History   Socioeconomic History  . Marital status: Divorced    Spouse name: Not on file  . Number of children: Not on file  . Years of education: Not on file  . Highest education level: Not on file  Occupational History  . Not on file  Social Needs  . Financial resource strain: Not on file  . Food insecurity:    Worry: Not on file    Inability: Not on file  . Transportation needs:    Medical: Not on  file    Non-medical: Not on file  Tobacco Use  . Smoking status: Former Smoker    Last attempt to quit: 1970    Years since quitting: 49.9  . Smokeless tobacco: Never Used  Substance and Sexual Activity  . Alcohol use: No    Alcohol/week: 0.0 standard drinks    Comment: may have a drink 1x/mo  . Drug use: No  . Sexual activity: Not Currently  Lifestyle  . Physical activity:    Days per week: Not on file    Minutes per session: Not on file  . Stress: Not on file  Relationships  . Social connections:    Talks on phone: Not on file    Gets together: Not on file    Attends religious service: Not on file    Active member of club or organization: Not on file    Attends meetings of clubs or organizations: Not on file    Relationship status: Not on file  . Intimate partner violence:    Fear of current or ex partner: Not on file    Emotionally abused: Not on file    Physically abused: Not on file    Forced sexual activity: Not on file  Other Topics Concern  . Not on file  Social History Narrative  . Not on file    Review of Systems: See HPI, otherwise negative ROS  Physical Exam: Ht 5' (1.524 m)   Wt 72.6 kg   BMI 31.25 kg/m  General:   Alert,  pleasant and cooperative in NAD Head:  Normocephalic and atraumatic. Neck:  Supple; no masses or thyromegaly. Lungs:  Clear throughout to auscultation.    Heart:  Regular rate and rhythm. Abdomen:  Soft, nontender and nondistended. Normal bowel sounds, without guarding, and without rebound.   Neurologic:  Alert and  oriented x4;  grossly normal neurologically.  Impression/Plan: Tricia Vasquez is now here to undergo a screening colonoscopy.  Risks, benefits, and alternatives regarding colonoscopy have been reviewed with the patient.  Questions have been answered.  All parties agreeable.

## 2018-10-05 ENCOUNTER — Ambulatory Visit: Payer: Medicare Other

## 2018-10-05 ENCOUNTER — Encounter: Payer: Self-pay | Admitting: Gastroenterology

## 2018-10-08 ENCOUNTER — Encounter: Payer: Self-pay | Admitting: Gastroenterology

## 2018-10-09 ENCOUNTER — Ambulatory Visit: Payer: Medicare Other

## 2018-10-10 ENCOUNTER — Encounter: Payer: Self-pay | Admitting: Gastroenterology

## 2018-10-18 ENCOUNTER — Ambulatory Visit: Payer: Medicare Other

## 2018-11-22 ENCOUNTER — Other Ambulatory Visit: Payer: Self-pay | Admitting: Family Medicine

## 2018-11-22 DIAGNOSIS — J45909 Unspecified asthma, uncomplicated: Secondary | ICD-10-CM

## 2018-11-22 DIAGNOSIS — G4730 Sleep apnea, unspecified: Secondary | ICD-10-CM

## 2018-11-22 NOTE — Telephone Encounter (Signed)
Left detailed VM. CRM created.  

## 2018-11-22 NOTE — Telephone Encounter (Signed)
Based on prescription from 09/17/18, it looks like patient is using one rescue inhaler every month Please REFER her to pulmonologist ASAP That is entirely too much rescue inhaler and means her breathing issues are out of control Thank you

## 2018-12-18 ENCOUNTER — Encounter: Payer: Self-pay | Admitting: Family Medicine

## 2018-12-18 ENCOUNTER — Ambulatory Visit (INDEPENDENT_AMBULATORY_CARE_PROVIDER_SITE_OTHER): Payer: Medicare Other | Admitting: Family Medicine

## 2018-12-18 VITALS — BP 110/78 | HR 74 | Temp 97.3°F | Resp 12 | Ht 60.0 in | Wt 161.4 lb

## 2018-12-18 DIAGNOSIS — I10 Essential (primary) hypertension: Secondary | ICD-10-CM

## 2018-12-18 DIAGNOSIS — E782 Mixed hyperlipidemia: Secondary | ICD-10-CM

## 2018-12-18 DIAGNOSIS — Z1239 Encounter for other screening for malignant neoplasm of breast: Secondary | ICD-10-CM

## 2018-12-18 DIAGNOSIS — F329 Major depressive disorder, single episode, unspecified: Secondary | ICD-10-CM

## 2018-12-18 DIAGNOSIS — M810 Age-related osteoporosis without current pathological fracture: Secondary | ICD-10-CM

## 2018-12-18 DIAGNOSIS — E7800 Pure hypercholesterolemia, unspecified: Secondary | ICD-10-CM

## 2018-12-18 DIAGNOSIS — Z8673 Personal history of transient ischemic attack (TIA), and cerebral infarction without residual deficits: Secondary | ICD-10-CM | POA: Diagnosis not present

## 2018-12-18 DIAGNOSIS — F32A Depression, unspecified: Secondary | ICD-10-CM

## 2018-12-18 DIAGNOSIS — Z1231 Encounter for screening mammogram for malignant neoplasm of breast: Secondary | ICD-10-CM

## 2018-12-18 LAB — HEPATIC FUNCTION PANEL
AG RATIO: 1.8 (calc) (ref 1.0–2.5)
ALKALINE PHOSPHATASE (APISO): 80 U/L (ref 37–153)
ALT: 33 U/L — ABNORMAL HIGH (ref 6–29)
AST: 24 U/L (ref 10–35)
Albumin: 4.2 g/dL (ref 3.6–5.1)
Bilirubin, Direct: 0.1 mg/dL (ref 0.0–0.2)
GLOBULIN: 2.4 g/dL (ref 1.9–3.7)
Indirect Bilirubin: 0.3 mg/dL (calc) (ref 0.2–1.2)
TOTAL PROTEIN: 6.6 g/dL (ref 6.1–8.1)
Total Bilirubin: 0.4 mg/dL (ref 0.2–1.2)

## 2018-12-18 LAB — BASIC METABOLIC PANEL
BUN / CREAT RATIO: 16 (calc) (ref 6–22)
BUN: 18 mg/dL (ref 7–25)
CHLORIDE: 104 mmol/L (ref 98–110)
CO2: 29 mmol/L (ref 20–32)
CREATININE: 1.1 mg/dL — AB (ref 0.50–0.99)
Calcium: 9.2 mg/dL (ref 8.6–10.4)
GLUCOSE: 87 mg/dL (ref 65–99)
Potassium: 4 mmol/L (ref 3.5–5.3)
Sodium: 140 mmol/L (ref 135–146)

## 2018-12-18 LAB — LIPID PANEL
Cholesterol: 235 mg/dL — ABNORMAL HIGH (ref <200)
HDL: 44 mg/dL — AB (ref >=50)
LDL CHOLESTEROL (CALC): 158 mg/dL — AB
NON-HDL CHOLESTEROL (CALC): 191 mg/dL — AB (ref <130)
TRIGLYCERIDES: 180 mg/dL — AB (ref <150)
Total CHOL/HDL Ratio: 5.3 (calc) — ABNORMAL HIGH (ref <5.0)

## 2018-12-18 MED ORDER — FLUOXETINE HCL 60 MG PO TABS: 0.5000 | ORAL_TABLET | Freq: Every day | ORAL | Status: DC

## 2018-12-18 NOTE — Patient Instructions (Addendum)
We'll get labs today We'll have you see the endocrinologist about your bone density   Osteoporosis  Osteoporosis happens when your bones get thin and weak. This can cause your bones to break (fracture) more easily. You can do things at home to make your bones stronger. Follow these instructions at home:  Activity  Exercise as told by your doctor. Ask your doctor what activities are safe for you. You should do: ? Exercises that make your muscles work to hold your body weight up (weight-bearing exercises). These include tai chi, yoga, and walking. ? Exercises to make your muscles stronger. One example is lifting weights. Lifestyle  Limit alcohol intake to no more than 1 drink a day for nonpregnant women and 2 drinks a day for men. One drink equals 12 oz of beer, 5 oz of wine, or 1 oz of hard liquor.  Do not use any products that have nicotine or tobacco in them. These include cigarettes and e-cigarettes. If you need help quitting, ask your doctor. Preventing falls  Use tools to help you move around (mobility aids) as needed. These include canes, walkers, scooters, and crutches.  Keep rooms well-lit and free of clutter.  Put away things that could make you trip. These include cords and rugs.  Install safety rails on stairs. Install grab bars in bathrooms.  Use rubber mats in slippery areas, like bathrooms.  Wear shoes that: ? Fit you well. ? Support your feet. ? Have closed toes. ? Have rubber soles or low heels.  Tell your doctor about all of the medicines you are taking. Some medicines can make you more likely to fall. General instructions  Eat plenty of calcium and vitamin D. These nutrients are good for your bones. Good sources of calcium and vitamin D include: ? Some fatty fish, such as salmon and tuna. ? Foods that have calcium and vitamin D added to them (fortified foods). For example, some breakfast cereals are fortified with calcium and vitamin D. ? Egg  yolks. ? Cheese. ? Liver.  Take over-the-counter and prescription medicines only as told by your doctor.  Keep all follow-up visits as told by your doctor. This is important. Contact a doctor if:  You have not been tested (screened) for osteoporosis and you are: ? A woman who is age 14 or older. ? A man who is age 8 or older. Get help right away if:  You fall.  You get hurt. Summary  Osteoporosis happens when your bones get thin and weak.  Weak bones can break (fracture) more easily.  Eat plenty of calcium and vitamin D. These nutrients are good for your bones.  Tell your doctor about all of the medicines that you take. This information is not intended to replace advice given to you by your health care provider. Make sure you discuss any questions you have with your health care provider. Document Released: 01/09/2012 Document Revised: 08/11/2017 Document Reviewed: 08/11/2017 Elsevier Interactive Patient Education  2019 [employer] Prevention in the Home, Adult Falls can cause injuries. They can happen to people of all ages. There are many things you can do to make your home safe and to help prevent falls. Ask for help when making these changes, if needed. What actions can I take to prevent falls? General Instructions  Use good lighting in all rooms. Replace any light bulbs that burn out.  Turn on the lights when you go into a dark area. Use night-lights.  Keep items that you use  often in easy-to-reach places. Lower the shelves around your home if necessary.  Set up your furniture so you have a clear path. Avoid moving your furniture around.  Do not have throw rugs and other things on the floor that can make you trip.  Avoid walking on wet floors.  If any of your floors are uneven, fix them.  Add color or contrast paint or tape to clearly mark and help you see: ? Any grab bars or handrails. ? First and last steps of stairways. ? Where the edge of each step  is.  If you use a stepladder: ? Make sure that it is fully opened. Do not climb a closed stepladder. ? Make sure that both sides of the stepladder are locked into place. ? Ask someone to hold the stepladder for you while you use it.  If there are any pets around you, be aware of where they are. What can I do in the bathroom?      Keep the floor dry. Clean up any water that spills onto the floor as soon as it happens.  Remove soap buildup in the tub or shower regularly.  Use non-skid mats or decals on the floor of the tub or shower.  Attach bath mats securely with double-sided, non-slip rug tape.  If you need to sit down in the shower, use a plastic, non-slip stool.  Install grab bars by the toilet and in the tub and shower. Do not use towel bars as grab bars. What can I do in the bedroom?  Make sure that you have a light by your bed that is easy to reach.  Do not use any sheets or blankets that are too big for your bed. They should not hang down onto the floor.  Have a firm chair that has side arms. You can use this for support while you get dressed. What can I do in the kitchen?  Clean up any spills right away.  If you need to reach something above you, use a strong step stool that has a grab bar.  Keep electrical cords out of the way.  Do not use floor polish or wax that makes floors slippery. If you must use wax, use non-skid floor wax. What can I do with my stairs?  Do not leave any items on the stairs.  Make sure that you have a light switch at the top of the stairs and the bottom of the stairs. If you do not have them, ask someone to add them for you.  Make sure that there are handrails on both sides of the stairs, and use them. Fix handrails that are broken or loose. Make sure that handrails are as long as the stairways.  Install non-slip stair treads on all stairs in your home.  Avoid having throw rugs at the top or bottom of the stairs. If you do have throw  rugs, attach them to the floor with carpet tape.  Choose a carpet that does not hide the edge of the steps on the stairway.  Check any carpeting to make sure that it is firmly attached to the stairs. Fix any carpet that is loose or worn. What can I do on the outside of my home?  Use bright outdoor lighting.  Regularly fix the edges of walkways and driveways and fix any cracks.  Remove anything that might make you trip as you walk through a door, such as a raised step or threshold.  Trim any bushes or  trees on the path to your home.  Regularly check to see if handrails are loose or broken. Make sure that both sides of any steps have handrails.  Install guardrails along the edges of any raised decks and porches.  Clear walking paths of anything that might make someone trip, such as tools or rocks.  Have any leaves, snow, or ice cleared regularly.  Use sand or salt on walking paths during winter.  Clean up any spills in your garage right away. This includes grease or oil spills. What other actions can I take?  Wear shoes that: ? Have a low heel. Do not wear high heels. ? Have rubber bottoms. ? Are comfortable and fit you well. ? Are closed at the toe. Do not wear open-toe sandals.  Use tools that help you move around (mobility aids) if they are needed. These include: ? Canes. ? Walkers. ? Scooters. ? Crutches.  Review your medicines with your doctor. Some medicines can make you feel dizzy. This can increase your chance of falling. Ask your doctor what other things you can do to help prevent falls. Where to find more information  Centers for Disease Control and Prevention, STEADI: https://garcia.biz/  [employer] on Aging: BrainJudge.co.uk Contact a doctor if:  You are afraid of falling at home.  You feel weak, drowsy, or dizzy at home.  You fall at home. Summary  There are many simple things that you can do to make your home safe and to help prevent  falls.  Ways to make your home safe include removing tripping hazards and installing grab bars in the bathroom.  Ask for help when making these changes in your home. This information is not intended to replace advice given to you by your health care provider. Make sure you discuss any questions you have with your health care provider. Document Released: 08/13/2009 Document Revised: 06/01/2017 Document Reviewed: 06/01/2017 Elsevier Interactive Patient Education  2019 [employer].

## 2018-12-18 NOTE — Assessment & Plan Note (Addendum)
Patient does not desire additional therapy at this time; goal LDL less than 70, taking aspirin 81 mg (gets asthma attack if higher dose);r efer to neuro for additional advice, recommendations, testing

## 2018-12-18 NOTE — Assessment & Plan Note (Signed)
Reduce the dose of prozac to 30 mg daily for one month, then call for 20 mg and then 10 mg and we'll taper down to lowest best dose; call if needed with any problems, go to the ER if dark thoughts, SI/HI

## 2018-12-18 NOTE — Assessment & Plan Note (Signed)
Well-controlled; avoid decongestants; avoid NSAIDs

## 2018-12-18 NOTE — Progress Notes (Signed)
BP 110/78   Pulse 74   Temp (!) 97.3 F (36.3 C) (Oral)   Resp 12   Ht 5' (1.524 m)   Wt 161 lb 6.4 oz (73.2 kg)   SpO2 96%   BMI 31.52 kg/m    Subjective:    Patient ID: Tricia Vasquez, female    DOB: [birth year], 68 y.o.   MRN: [id number]  HPI: Tricia Vasquez is a 68 y.o. female  Chief Complaint  Patient presents with  . Follow-up    HPI Patient is here for follow-up  She had a colonoscopy Oct 04, 2018; three polyps removed, two tubular adenomas on the path report (note sent to GI); no residual bleeding; both parents had colon cancer, but they delayed getting colonoscopies until they got Medicare; next colonoscopy in 5 years  HTN; not a salt shaker user, not using NSAIDs; does use plain fexofenadine with my blessing  Hx of stroke, trouble with thinking (slowed), trouble with speech; she is fine if she doesn't get too excited; does not want to see anyone right now; only able to tolerate 81 mg aspirin; not seeing neurologist; would be willing to go when offered  Hx of heart attack; no chest pain; on aspirin and atenolol; cannot tolerate statin; she had to go to the ER the last time she took a statin; started hyperventilating and getting really hot, couldn't swallow within 30 minutes after taking statin  High cholesterol; no red meat in 2 months; no fatty pig meat; just fish Not much cheese; some fried foods when really cold the other day; she did not start zetia, afraid of a reaction  Lab Results  Component Value Date   CHOL 235 (H) 12/18/2018   HDL 44 (L) 12/18/2018   LDLCALC 158 (H) 12/18/2018   TRIG 180 (H) 12/18/2018   CHOLHDL 5.3 (H) 12/18/2018   Had DEXA scan in Feb 2019 in [place]; not on treatment; ordered by specialist in [place]; she doesn't remember any treatment; just taking calcium therapy; remote hx of stomach ulcers; does get indigestion  Due for other labs 6 weeks after last (done on Nov 18th, due late Dec or early Jan)  She was on prozac 10 mg  to 60 mg in 2011; diagnosed with clinically depression years ago; lots of family stuff going on; failed proficiency exam and lost job, lots within 30 days; single episode of depression and just stayed on the medicine Wants to back down at this point [place] for a week and didn't take the 60 mg  Depression screen [facility] 2/9 12/18/2018 09/17/2018 03/24/2016  Decreased Interest 2 2 1   Down, Depressed, Hopeless 1 2 1   PHQ - 2 Score 3 4 2   Altered sleeping 3 1 0  Tired, decreased energy 1 3 1   Change in appetite 1 0 1  Feeling bad or failure about yourself  0 0 0  Trouble concentrating 0 0 1  Moving slowly or fidgety/restless 0 3 1  Suicidal thoughts 0 0 0  PHQ-9 Score 8 11 6   Difficult doing work/chores Somewhat difficult Not difficult at all Somewhat difficult   Fall Risk  12/18/2018 09/17/2018 03/24/2016  Falls in the past year? 0 1 Yes  Number falls in past yr: 0 1 2 or more  Injury with Fall? 0 0 Yes  Risk Factor Category  - - High Fall Risk  Risk for fall due to : - - History of fall(s);Impaired vision    Relevant past medical, surgical, family and social history  reviewed Past Medical History:  Diagnosis Date  . Chronic kidney disease    stage 1  . Depression   . Heart attack ([place])    (x2) 1980s and 1990s  . Hepatitis C   . Hyperlipidemia   . Hypertension   . 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 Osteoporosis   . Sleep apnea    No CPAP  . Stroke [facility]) 2013   Past Surgical History:  Procedure Laterality Date  . ABDOMINAL HYSTERECTOMY    . COLON SURGERY    . COLONOSCOPY WITH PROPOFOL N/A 10/04/2018   Procedure: COLONOSCOPY WITH PROPOFOL;  Surgeon: Lucilla Lame, MD;  Location: [facility];  Service: Endoscopy;  Laterality: N/A;  sleep apnea  . OVARIAN CYST REMOVAL    . POLYPECTOMY  10/04/2018   Procedure: POLYPECTOMY;  Surgeon: Lucilla Lame, MD;  Location: [facility];  Service: Endoscopy;;  . TONSILLECTOMY     Family History  Problem Relation Age of  Onset  . Hypertension Mother   . Hypertension Father   . Hypertension Sister   . Hypertension Brother    Social History   Tobacco Use  . Smoking status: Former Smoker    Last attempt to quit: 1970    Years since quitting: 50.1  . Smokeless tobacco: Never Used  Substance Use Topics  . Alcohol use: No    Alcohol/week: 0.0 standard drinks    Comment: may have a drink 1x/mo  . Drug use: No     [facility] from 12/18/2018 in [facility]  AUDIT-C Score  0      Interim medical history since last visit reviewed. Allergies and medications reviewed  Review of Systems Per HPI unless specifically indicated above     Objective:    BP 110/78   Pulse 74   Temp (!) 97.3 F (36.3 C) (Oral)   Resp 12   Ht 5' (1.524 m)   Wt 161 lb 6.4 oz (73.2 kg)   SpO2 96%   BMI 31.52 kg/m   Wt Readings from Last 3 Encounters:  12/18/18 161 lb 6.4 oz (73.2 kg)  10/04/18 156 lb (70.8 kg)  09/17/18 161 lb 9.6 oz (73.3 kg)    Physical Exam Constitutional:      General: She is not in acute distress.    Appearance: She is well-developed. She is not diaphoretic.  HENT:     Head: Normocephalic and atraumatic.  Eyes:     General: No scleral icterus. Neck:     Thyroid: No thyromegaly.  Cardiovascular:     Rate and Rhythm: Normal rate and regular rhythm.     Heart sounds: Normal heart sounds. No murmur.  Pulmonary:     Effort: Pulmonary effort is normal. No respiratory distress.     Breath sounds: Normal breath sounds. No wheezing.  Abdominal:     General: Bowel sounds are normal. There is no distension.     Palpations: Abdomen is soft.  Skin:    General: Skin is warm and dry.     Coloration: Skin is not pale.     Comments: No xanthelasma  Neurological:     Mental Status: She is alert.  Psychiatric:        Mood and Affect: Mood is not anxious or depressed.        Behavior: Behavior normal.        Thought Content: Thought content normal.        Judgment: Judgment  normal.  Results for orders placed or performed in visit on 17/40/81  Basic Metabolic Panel (BMET)  Result Value Ref Range   Glucose, Bld 87 65 - 99 mg/dL   BUN 18 7 - 25 mg/dL   Creat 1.10 (H) 0.50 - 0.99 mg/dL   BUN/Creatinine Ratio 16 6 - 22 (calc)   Sodium 140 135 - 146 mmol/L   Potassium 4.0 3.5 - 5.3 mmol/L   Chloride 104 98 - 110 mmol/L   CO2 29 20 - 32 mmol/L   Calcium 9.2 8.6 - 10.4 mg/dL  Hepatic function panel  Result Value Ref Range   Total Protein 6.6 6.1 - 8.1 g/dL   Albumin 4.2 3.6 - 5.1 g/dL   Globulin 2.4 1.9 - 3.7 g/dL (calc)   AG Ratio 1.8 1.0 - 2.5 (calc)   Total Bilirubin 0.4 0.2 - 1.2 mg/dL   Bilirubin, Direct 0.1 0.0 - 0.2 mg/dL   Indirect Bilirubin 0.3 0.2 - 1.2 mg/dL (calc)   Alkaline phosphatase (APISO) 80 37 - 153 U/L   AST 24 10 - 35 U/L   ALT 33 (H) 6 - 29 U/L  Lipid Profile  Result Value Ref Range   Cholesterol 235 (H) <200 mg/dL   HDL 44 (L) > OR = 50 mg/dL   Triglycerides 180 (H) <150 mg/dL   LDL Cholesterol (Calc) 158 (H) mg/dL (calc)   Total CHOL/HDL Ratio 5.3 (H) <5.0 (calc)   Non-HDL Cholesterol (Calc) 191 (H) <130 mg/dL (calc)      Assessment & Plan:   Problem List Items Addressed This Visit      Cardiovascular and Mediastinum   BP (high blood pressure) - Primary    Well-controlled; avoid decongestants; avoid NSAIDs        Musculoskeletal and Integument   Osteoporosis, post-menopausal   Relevant Orders   Ambulatory referral to Endocrinology     Other   Hx of completed stroke    Patient does not desire additional therapy at this time; goal LDL less than 70, taking aspirin 81 mg (gets asthma attack if higher dose);r efer to neuro for additional advice, recommendations, testing      Relevant Orders   Ambulatory referral to Neurology   HLD (hyperlipidemia)    Check lipids, truly fasting today; limiting red meat and fatty pig meat; limiting fried foods      High blood cholesterol level    Cannot tolerate statins;  check level      Clinical depression    Reduce the dose of prozac to 30 mg daily for one month, then call for 20 mg and then 10 mg and we'll taper down to lowest best dose; call if needed with any problems, go to the ER if dark thoughts, SI/HI       Other Visit Diagnoses    Screening for breast cancer       Breast cancer screening       Screening mammogram, encounter for       Relevant Orders   MM 3D SCREEN BREAST BILATERAL       Follow up plan: Return in about 6 months (around 06/18/2019) for follow-up visit with Dr. Sanda Klein (or just after).  An after-visit summary was printed and given to the patient at [facility].  Please see the patient instructions which may contain other information and recommendations beyond what is mentioned above in the assessment and plan.  Meds ordered this encounter  Medications  . DISCONTD: FLUoxetine HCl 60 MG TABS    Sig:  Take 0.5 tablets by mouth daily. Labs and appt needed for further refills    Orders Placed This Encounter  Procedures  . MM 3D SCREEN BREAST BILATERAL  . Ambulatory referral to Neurology  . Ambulatory referral to Endocrinology

## 2018-12-18 NOTE — Assessment & Plan Note (Signed)
Cannot tolerate statins; check level

## 2018-12-18 NOTE — Assessment & Plan Note (Signed)
Check lipids, truly fasting today; limiting red meat and fatty pig meat; limiting fried foods

## 2018-12-19 NOTE — Telephone Encounter (Signed)
Tricia Vasquez, please let the patient know that her glucose is normal. Her kidneys are not working as well as they did when she was 68 years old, so avoid NSAIDs and stay well-hydrated. Her liver enzyme is better. Her cholesterol is much too high. We have a few options: zetia which is a pill, repatha which is a shot, or a referral to a lipid specialist; let me know what she chooses; we have to get her LDL down or she risks a higher chance of suffering another heart attack or another stroke.

## 2018-12-22 ENCOUNTER — Other Ambulatory Visit: Payer: Self-pay | Admitting: Family Medicine

## 2018-12-23 MED ORDER — FLUOXETINE HCL 60 MG PO TABS: 0.5000 | ORAL_TABLET | Freq: Every day | ORAL | 1 refills | Status: DC

## 2018-12-23 NOTE — Telephone Encounter (Signed)
Fluoxetine requested; Rx sent  Please follow up on the abnormal lipid results If you cannot reach her, send her a letter asking her to call the office about this

## 2018-12-24 ENCOUNTER — Telehealth: Payer: Self-pay | Admitting: Family Medicine

## 2018-12-24 NOTE — Telephone Encounter (Signed)
Patient called, left VM to return call to the office for lab results.  Notes recorded by Arnetha Courser, MD on 12/19/2018 at 7:35 PM EST Joelene Millin, please let the patient know that her glucose is normal. Her kidneys are not working as well as they did when she was 68 years old, so avoid NSAIDs and stay well-hydrated. Her liver enzyme is better. Her cholesterol is much too high. We have a few options: zetia which is a pill, repatha which is a shot, or a referral to a lipid specialist; let me know what she chooses; we have to get her LDL down or she risks a higher chance of suffering another heart attack or another stroke.

## 2018-12-24 NOTE — Telephone Encounter (Signed)
The Zetia will take a few weeks We could start that and recheck her cholesterol in one month  The Repatha is NOT a statin, and has nothing to do with statins; we would have to have her see an endocrinologist or cardiologist for this

## 2018-12-24 NOTE — Telephone Encounter (Signed)
Letter sent.

## 2018-12-24 NOTE — Telephone Encounter (Signed)
Copied from [place] [id number]. Topic: General - Other >> Dec 24, 2018  3:20 PM Oneta Rack wrote:  Patient would like to discuss her most recent cholesterol lab results, please advise

## 2018-12-24 NOTE — Telephone Encounter (Signed)
Patient given results per notes of Dr. Sanda Klein noted 12/19/18, patient verbalized understanding. She asks if the Zetia will work immediately or take 30 days like the other cholesterol medication. She asks is the shot like the statin drugs. I advised I will send this over to Dr. Sanda Klein and someone will call with her recommendation.

## 2018-12-25 NOTE — Telephone Encounter (Signed)
Patient notified

## 2019-03-29 ENCOUNTER — Other Ambulatory Visit: Payer: Self-pay | Admitting: Family Medicine

## 2019-05-07 ENCOUNTER — Other Ambulatory Visit: Payer: Self-pay | Admitting: Family Medicine

## 2019-06-18 ENCOUNTER — Ambulatory Visit: Payer: Medicare Other | Admitting: Family Medicine

## 2019-06-22 ENCOUNTER — Other Ambulatory Visit: Payer: Self-pay | Admitting: Nurse Practitioner

## 2019-06-22 ENCOUNTER — Telehealth: Payer: Self-pay | Admitting: Family Medicine

## 2019-06-24 NOTE — Telephone Encounter (Signed)
LVM TO [facility] 3 MONTH FOLLOW UP AND MEDS CALLED IN

## 2019-06-24 NOTE — Telephone Encounter (Signed)
Needs appointment in the next 3 months  

## 2019-06-24 NOTE — Telephone Encounter (Signed)
Patient needs routine appointment within the next 3 months

## 2019-06-24 NOTE — Telephone Encounter (Signed)
lvm for pt to schedule an appt 

## 2019-08-07 ENCOUNTER — Other Ambulatory Visit: Payer: Self-pay

## 2019-08-07 MED ORDER — HYDROCHLOROTHIAZIDE 25 MG PO TABS: 25.0000 mg | ORAL_TABLET | Freq: Every day | ORAL | 0 refills | Status: DC

## 2019-08-08 ENCOUNTER — Telehealth: Payer: Self-pay | Admitting: Family Medicine

## 2019-08-08 NOTE — Telephone Encounter (Signed)
Requested medication (s) are due for refill today: yes  Requested medication (s) are on the active medication list: yes  Last refill:  04/30/2019  Future visit scheduled: no  Notes to clinic: review for refill Patient has not seen anyone else at the clinic   Requested Prescriptions  Pending Prescriptions Disp Refills   fexofenadine (ALLEGRA) 180 MG tablet [Pharmacy Med Name: FEXOFENADINE HCL 180 MG TABLET] 90 tablet 0    Sig: TAKE 1 TABLET BY MOUTH EVERY DAY     Ear, Nose, and Throat:  Antihistamines Passed - 08/08/2019  2:25 AM      Passed - Valid encounter within last 12 months    Recent Outpatient Visits          7 months ago Essential hypertension   [facility], Satira Anis, MD   10 months ago Osteoporosis, post-menopausal   [facility] L[facility], Satira Anis, MD   3 years ago Preventative health care   [facility] L[facility], Satira Anis, MD

## 2019-08-08 NOTE — Telephone Encounter (Signed)
Please schedule patient for follow up in the next 30 days.  

## 2019-08-08 NOTE — Telephone Encounter (Signed)
No answer, no option for VM °

## 2019-08-12 ENCOUNTER — Telehealth: Payer: Self-pay

## 2019-08-12 MED ORDER — FLUOXETINE HCL 60 MG PO TABS: 0.5000 | ORAL_TABLET | Freq: Every day | ORAL | 0 refills | Status: DC

## 2019-08-12 MED ORDER — ATENOLOL 50 MG PO TABS: 50.0000 mg | ORAL_TABLET | Freq: Every day | ORAL | 0 refills | Status: DC

## 2019-08-13 NOTE — Telephone Encounter (Signed)
Spoke with pt and she was willing to reschedule for a virtual appt.

## 2019-08-13 NOTE — Telephone Encounter (Signed)
Can she do a virtual visit? We can order labs at outside labs if she'll be out of the area for a while.  It will be easier once we meet and I check things- then I can do longer refills  Thanks Kristeen Miss

## 2019-08-13 NOTE — Telephone Encounter (Signed)
Pt has scheduled an appt for Nov 08, 2019. She has been [place] since [place] and will not return until January. Are you able to give her enough until then or should she schedule sooner for virtual appt?

## 2019-08-22 ENCOUNTER — Other Ambulatory Visit: Payer: Self-pay

## 2019-08-22 ENCOUNTER — Ambulatory Visit (INDEPENDENT_AMBULATORY_CARE_PROVIDER_SITE_OTHER): Payer: Medicare Other | Admitting: Family Medicine

## 2019-08-22 ENCOUNTER — Encounter: Payer: Self-pay | Admitting: Family Medicine

## 2019-08-22 DIAGNOSIS — F320 Major depressive disorder, single episode, mild: Secondary | ICD-10-CM

## 2019-08-22 DIAGNOSIS — J4530 Mild persistent asthma, uncomplicated: Secondary | ICD-10-CM

## 2019-08-22 DIAGNOSIS — I10 Essential (primary) hypertension: Secondary | ICD-10-CM | POA: Diagnosis not present

## 2019-08-22 DIAGNOSIS — I252 Old myocardial infarction: Secondary | ICD-10-CM

## 2019-08-22 DIAGNOSIS — I208 Other forms of angina pectoris: Secondary | ICD-10-CM | POA: Diagnosis not present

## 2019-08-22 DIAGNOSIS — Z5181 Encounter for therapeutic drug level monitoring: Secondary | ICD-10-CM

## 2019-08-22 DIAGNOSIS — E782 Mixed hyperlipidemia: Secondary | ICD-10-CM | POA: Diagnosis not present

## 2019-08-22 DIAGNOSIS — H8103 Meniere's disease, bilateral: Secondary | ICD-10-CM

## 2019-08-22 MED ORDER — MONTELUKAST SODIUM 10 MG PO TABS: 10.0000 mg | ORAL_TABLET | Freq: Every day | ORAL | 11 refills | Status: DC

## 2019-08-22 MED ORDER — ATENOLOL 50 MG PO TABS: 50.0000 mg | ORAL_TABLET | Freq: Every day | ORAL | 1 refills | Status: DC

## 2019-08-22 MED ORDER — MECLIZINE HCL 25 MG PO TABS: 25.0000 mg | ORAL_TABLET | Freq: Three times a day (TID) | ORAL | 5 refills | Status: DC | PRN

## 2019-08-22 MED ORDER — FLUOXETINE HCL 60 MG PO TABS: 0.5000 | ORAL_TABLET | Freq: Every day | ORAL | 1 refills | Status: DC

## 2019-08-22 MED ORDER — HYDROCHLOROTHIAZIDE 25 MG PO TABS: 25.0000 mg | ORAL_TABLET | Freq: Every day | ORAL | 1 refills | Status: DC

## 2019-08-22 MED ORDER — NITROGLYCERIN 0.4 MG SL SUBL: 0.4000 mg | SUBLINGUAL_TABLET | SUBLINGUAL | 0 refills | Status: DC | PRN

## 2019-08-22 NOTE — Progress Notes (Signed)
Name: Tricia Vasquez   MRN: [id number]    DOB: 1951/09/22   Date:08/22/2019       Progress Note  Subjective:    Chief Complaint  Chief Complaint  Patient presents with  . Medication Refill  . Hypertension  . Asthma    I connected with  [name]  on 08/22/19 at  1:40 PM EDT by a video enabled telemedicine application and verified that I am speaking with the correct person using two identifiers.  I discussed the limitations of evaluation and management by telemedicine and the availability of in person appointments. The patient expressed understanding and agreed to proceed. Staff also discussed with the patient that there may be a patient responsible charge related to this service. Patient Location: home Provider Location: [facility] clinic Additional Individuals present: none  HPI  Pt present today via virtual encounter for f/up, she is out of state currently with her daughter and grandchild.  Got "stuck" in [place] with them, but plans on coming back after the holidays.  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  Sx resolve quickly with NG, she has not had any sx require multiple doses, no sx lasting longer than 20 min, and no sx with onset at rest.  She last saw cardiology in 2019.  She will arrange f/up with them when she comes back to [place].   Also hx of Stroke in 2013 with some mild cognitive dysfunction, some mild difficulty with speech, and sometimes her thought process, is slow.    Hypertension:  Currently managed on atenolol, HCTZ Pt reports fairly good med compliance, occasionally forgets some of her meds and denies any SE.  No lightheadedness, hypotension, syncope. Blood pressure today is well controlled.  BP today - 119/68 - no lightheaded episodes, syncope, no LE edema (only if she forgets her meds)  BP Readings from Last 3 Encounters:  12/18/18  110/78  10/04/18 113/73  09/17/18 120/80   Pt denies palpitation, Ha's, visual disturbances Dietary efforts for BP?  Trying to eat healthy and avoid salt  HLD - statin intolerant, prescribed zetia in the past but was too afraid to take it, statin in the past sent her to the ER. Hyperlipidemia: Current Medication Regimen:  Statin intolerant Last Lipids: Lab Results  Component Value Date   CHOL 235 (H) 12/18/2018   HDL 44 (L) 12/18/2018   LDLCALC 158 (H) 12/18/2018   TRIG 180 (H) 12/18/2018   CHOLHDL 5.3 (H) 12/18/2018   - Current Diet:  In general healthy - avoids fried and fatty foods - Denies:, myalgias. - Documented aortic atherosclerosis? Yes - Risk factors for atherosclerosis: arteriosclerotic heart disease, hypercholesterolemia and hypertension  Asthma - pt taking singulair and using rescue inhaler only about once a week, no nighttime waking. No longer taking maintenance inhaler.  She is managing her seasonal nasal allergies with OTC meds, allegra and flonase.  MDD/Depression for many years - she has been taking 1/2 tablet of fluoxetine 60 mg for the last year and she still feels like shes doing well on reduced dose.  Mood is good. Depression screen [facility] 2/9 08/22/2019 12/18/2018 09/17/2018  Decreased Interest 0 2 2  Down, Depressed, Hopeless 0 1 2  PHQ - 2 Score 0 3 4  Altered sleeping 0 3 1  Tired, decreased energy 0 1 3  Change in appetite 0 1 0  Feeling bad or failure about yourself  0 0 0  Trouble concentrating 0 0 0  Moving slowly or fidgety/restless 0 0 3  Suicidal thoughts 0 0 0  PHQ-9 Score 0 8 11  Difficult doing work/chores Not difficult at all Somewhat difficult Not difficult at all   Patient Active Problem List   Diagnosis Date Noted  . Special screening for malignant neoplasms, colon   . Benign neoplasm of transverse colon   . Benign neoplasm of ascending colon   . Apnea, sleep 04/11/2016  . 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 Auditory vertigo  04/11/2016  . Decreased potassium in the blood 04/11/2016  . BP (high blood pressure) 04/11/2016  . HLD (hyperlipidemia) 04/11/2016  . HCV (hepatitis C virus) 04/11/2016  . Clinical depression 04/11/2016  . Asthma without status asthmaticus 04/11/2016  . High blood cholesterol level 03/24/2016  . Chronic kidney disease (CKD), stage I 03/24/2016  . Hx of myocardial infarction 03/24/2016  . Hx of hepatitis C 03/24/2016  . Hx of completed stroke 03/24/2016  . Preventative health care 03/24/2016  . 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 Cerebrovascular accident (CVA) ([place]) 08/06/2014  . Headache disorder 08/06/2014    Past Surgical History:  Procedure Laterality Date  . ABDOMINAL HYSTERECTOMY    . COLON SURGERY    . COLONOSCOPY WITH PROPOFOL N/A 10/04/2018   Procedure: COLONOSCOPY WITH PROPOFOL;  Surgeon: Lucilla Lame, MD;  Location: [facility];  Service: Endoscopy;  Laterality: N/A;  sleep apnea  . OVARIAN CYST REMOVAL    . POLYPECTOMY  10/04/2018   Procedure: POLYPECTOMY;  Surgeon: Lucilla Lame, MD;  Location: [facility];  Service: Endoscopy;;  . TONSILLECTOMY      Family History  Problem Relation Age of Onset  . Hypertension Mother   . Hypertension Father   . Hypertension Sister   . Hypertension Brother     Social History   Socioeconomic History  . Marital status: Divorced    Spouse name: Not on file  . Number of children: Not on file  . Years of education: Not on file  . Highest education level: Not on file  Occupational History  . Not on file  Social Needs  . Financial resource strain: Not on file  . Food insecurity    Worry: Not on file    Inability: Not on file  . Transportation needs    Medical: Not on file    Non-medical: Not on file  Tobacco Use  . Smoking status: Former Smoker    Quit date: 1970    Years since quitting: 50.8  . Smokeless tobacco: Never Used  Substance and Sexual Activity  . Alcohol use: No    Alcohol/week:  0.0 standard drinks    Comment: may have a drink 1x/mo  . Drug use: No  . Sexual activity: Not Currently  Lifestyle  . Physical activity    Days per week: Not on file    Minutes per session: Not on file  . Stress: Not on file  Relationships  . Social Herbalist on phone: Not on file    Gets together: Not on file    Attends religious service: Not on file    Active member of club or organization: Not on file    Attends meetings of clubs or organizations: Not on file    Relationship status: Not on file  . Intimate partner violence    Fear  of current or ex partner: Not on file    Emotionally abused: Not on file    Physically abused: Not on file    Forced sexual activity: Not on file  Other Topics Concern  . Not on file  Social History Narrative  . Not on file     Current Outpatient Medications:  .  albuterol (PROVENTIL HFA;VENTOLIN HFA) 108 (90 Base) MCG/ACT inhaler, INHALE 2 PUFFS INTO THE LUNGS EVERY 4 (FOUR) HOURS AS NEEDED FOR WHEEZING OR SHORTNESS OF BREATH., Disp: 8.5 Inhaler, Rfl: 0 .  aspirin EC 81 MG tablet, Take 81 mg by mouth 2 (two) times daily., Disp: , Rfl:  .  atenolol (TENORMIN) 50 MG tablet, Take 1 tablet (50 mg total) by mouth daily., Disp: 30 tablet, Rfl: 0 .  BLACK WALNUT POLLEN , Inject 1 tablet into the skin once., Disp: , Rfl:  .  Cholecalciferol (VITAMIN D) 2000 units tablet, Take 2,000 Units by mouth 3 days., Disp: , Rfl:  .  Cyanocobalamin (VITAMIN B-12) 5000 MCG SUBL, Place 1 tablet under the tongue 3 days., Disp: , Rfl:  .  fexofenadine (ALLEGRA) 180 MG tablet, TAKE 1 TABLET BY MOUTH EVERY DAY, Disp: 90 tablet, Rfl: 0 .  FLUoxetine HCl 60 MG TABS, Take 0.5 tablets by mouth daily., Disp: 20 tablet, Rfl: 0 .  fluticasone (FLONASE) 50 MCG/ACT nasal spray, USE 2 SPRAYS DAILY PER NOSTRIL, Disp: 16 g, Rfl: 12 .  GARLIC OIL PO, Take 1 pg/oz/day by mouth once., Disp: , Rfl:  .  hydrochlorothiazide (HYDRODIURIL) 25 MG tablet, Take 1 tablet (25 mg  total) by mouth daily., Disp: 30 tablet, Rfl: 0 .  meclizine (ANTIVERT) 25 MG tablet, Take 1 tablet (25 mg total) by mouth 3 (three) times daily as needed for dizziness., Disp: 60 tablet, Rfl: 5 .  Milk Thistle 250 MG CAPS, Take 1 capsule by mouth once., Disp: , Rfl:  .  Misc Natural Products (DANDELION ROOT PO), Take 1 tablet by mouth once., Disp: , Rfl:  .  montelukast (SINGULAIR) 10 MG tablet, Take 1 tablet (10 mg total) by mouth at bedtime., Disp: 30 tablet, Rfl: 11 .  nitroGLYCERIN (NITROSTAT) 0.4 MG SL tablet, Place 1 tablet (0.4 mg total) under the tongue every 5 (five) minutes as needed for chest pain. Call 911; max 3 per episode, Disp: 25 tablet, Rfl: 0 .  TURMERIC PO, Take 1 tablet by mouth once., Disp: , Rfl:  .  VITAMIN E PO, Take by mouth daily., Disp: , Rfl:  .  beclomethasone (QVAR) 80 MCG/ACT inhaler, Inhale 2 puffs into the lungs 2 (two) times daily. (Patient not taking: Reported on 12/18/2018), Disp: 1 Inhaler, Rfl: 11  Allergies  Allergen Reactions  . Penicillins Swelling and Rash       . Statins Shortness Of Breath    And chest pain  . Aspirin Other (See Comments)    Tongue swelling    I personally reviewed active problem list, medication list, allergies, family history, social history, health maintenance, notes from last encounter, lab results with the patient/caregiver today.  Review of Systems  Constitutional: Negative.   HENT: Negative.   Eyes: Negative.   Respiratory: Negative.   Cardiovascular: Negative.   Gastrointestinal: Negative.   Endocrine: Negative.   Genitourinary: Negative.   Musculoskeletal: Negative.   Skin: Negative.   Allergic/Immunologic: Negative.   Neurological: Negative.   Hematological: Negative.   Psychiatric/Behavioral: Negative.   All other systems reviewed and are negative.     Objective:  Virtual encounter, vitals limited, only able to obtain the following There were no vitals filed for this visit. There is no height or  weight on file to calculate BMI. Nursing Note and Vital Signs reviewed.  Physical Exam Vitals signs and nursing note reviewed.  Constitutional:      General: She is not in acute distress.    Appearance: Normal appearance. She is well-developed. She is not ill-appearing, toxic-appearing or diaphoretic.  HENT:     Head: Normocephalic and atraumatic.     Nose: Nose normal.  Eyes:     General:        Right eye: No discharge.        Left eye: No discharge.     Conjunctiva/sclera: Conjunctivae normal.  Neck:     Musculoskeletal: Normal range of motion.     Trachea: No tracheal deviation.  Pulmonary:     Effort: Pulmonary effort is normal. No respiratory distress.     Breath sounds: No stridor.     Comments: Normal resiprations, no audible wheeze Musculoskeletal: Normal range of motion.  Skin:    Coloration: Skin is not jaundiced or pale.     Findings: No rash.  Neurological:     Mental Status: She is alert.     Motor: No abnormal muscle tone.     Coordination: Coordination normal.  Psychiatric:        Mood and Affect: Mood normal.        Behavior: Behavior normal.     PE limited by telephone encounter  No results found for this or any previous visit (from the past 72 hour(s)).  PHQ2/9: Depression screen [facility] 2/9 08/22/2019 12/18/2018 09/17/2018 03/24/2016  Decreased Interest 0 2 2 1   Down, Depressed, Hopeless 0 1 2 1   PHQ - 2 Score 0 3 4 2   Altered sleeping 0 3 1 0  Tired, decreased energy 0 1 3 1   Change in appetite 0 1 0 1  Feeling bad or failure about yourself  0 0 0 0  Trouble concentrating 0 0 0 1  Moving slowly or fidgety/restless 0 0 3 1  Suicidal thoughts 0 0 0 0  PHQ-9 Score 0 8 11 6   Difficult doing work/chores Not difficult at all Somewhat difficult Not difficult at all Somewhat difficult   PHQ-2/9 Result is negative - reviewed see above.    Fall Risk: Fall Risk  08/22/2019 12/18/2018 09/17/2018 03/24/2016  Falls in the past year? 0 0 1 Yes  Number falls  in past yr: 0 0 1 2 or more  Injury with Fall? 0 0 0 Yes  Risk Factor Category  - - - High Fall Risk  Risk for fall due to : - - - History of fall(s);Impaired vision     Assessment and Plan:       ICD-10-CM   1. Essential hypertension  I10 atenolol (TENORMIN) 50 MG tablet    hydrochlorothiazide (HYDRODIURIL) 25 MG tablet    CBC with Differential/Platelet    COMPLETE METABOLIC PANEL WITH GFR    Lipid panel   well controlled - refill on meds, labs due - but [place], have asked that she get labs there is able  2. Stable angina (HCC)  I20.8 nitroGLYCERIN (NITROSTAT) 0.4 MG SL tablet    CBC with Differential/Platelet    COMPLETE METABOLIC PANEL WITH GFR    Lipid panel   sx continue to be consistent with stable angina - her sx may also be anxiety or asthma -  encouraged her to f/up with cardiology   3. Hx of myocardial infarction  I25.2 atenolol (TENORMIN) 50 MG tablet    nitroGLYCERIN (NITROSTAT) 0.4 MG SL tablet   per cardiology   4. Mixed hyperlipidemia  E78.2 CBC with Differential/Platelet    COMPLETE METABOLIC PANEL WITH GFR    Lipid panel   last labs very elevated, statin allergy/intolerance, previous excellent diet, recheck labs  5. Mild persistent asthma without status asthmaticus without complication  [id number] montelukast (SINGULAIR) 10 MG tablet   on singulair and albuterol inhaler, using inhaler about one time per week  6. 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 Current mild episode of major depressive disorder, unspecified whether recurrent (HCC)  F32.0 FLUoxetine HCl 60 MG TABS   well controlled with current dose - 30 mg  8. Encounter for medication monitoring  Z51.81 CBC with Differential/Platelet    COMPLETE METABOLIC PANEL WITH GFR    Lipid panel     I discussed the assessment and treatment plan with the patient. The patient was provided an opportunity to ask questions and all  were answered. The patient agreed with the plan and demonstrated an understanding of the instructions.  The patient was advised to call back or seek an in-person evaluation if the symptoms worsen or if the condition fails to improve as anticipated.  I provided 18 minutes of non-face-to-face time during this encounter.  Delsa Grana, PA-C 10/22/201:58 PM

## 2019-08-28 ENCOUNTER — Encounter: Payer: Self-pay | Admitting: Family Medicine

## 2019-09-06 ENCOUNTER — Ambulatory Visit: Payer: Medicare Other

## 2019-11-08 ENCOUNTER — Ambulatory Visit: Payer: Medicare Other | Admitting: Family Medicine

## 2020-02-05 ENCOUNTER — Other Ambulatory Visit: Payer: Self-pay | Admitting: Family Medicine

## 2020-02-05 DIAGNOSIS — I252 Old myocardial infarction: Secondary | ICD-10-CM

## 2020-02-05 DIAGNOSIS — I208 Other forms of angina pectoris: Secondary | ICD-10-CM

## 2020-02-05 NOTE — Telephone Encounter (Signed)
Requested Prescriptions  Pending Prescriptions Disp Refills  . nitroGLYCERIN (NITROSTAT) 0.4 MG SL tablet [Pharmacy Med Name: NITROGLYCERIN 0.4 MG TABLET SL] 25 tablet 0    Sig: PLACE 1 TAB UNDER THE TONGUE EVERY 5 MINUTES AS NEEDED FOR CHEST PAIN. CALL 911 MAX 3 PER EPISODE     Cardiovascular:  Nitrates Passed - 02/05/2020  3:04 PM      Passed - Last BP in normal range    BP Readings from Last 1 Encounters:  12/18/18 110/78         Passed - Last Heart Rate in normal range    Pulse Readings from Last 1 Encounters:  12/18/18 74         Passed - Valid encounter within last 12 months    Recent Outpatient Visits          5 months ago Essential hypertension   [facility] Delsa Grana, PA-C   1 year ago Essential hypertension   [place], Satira Anis, MD   1 year ago Osteoporosis, post-menopausal   [facility], Satira Anis, MD   3 years ago Preventative health care   [facility] [place], Satira Anis, MD

## 2020-03-23 ENCOUNTER — Other Ambulatory Visit: Payer: Self-pay | Admitting: Family Medicine

## 2020-03-23 DIAGNOSIS — I252 Old myocardial infarction: Secondary | ICD-10-CM

## 2020-03-23 DIAGNOSIS — I10 Essential (primary) hypertension: Secondary | ICD-10-CM

## 2020-05-05 ENCOUNTER — Telehealth: Payer: Self-pay | Admitting: Family Medicine

## 2020-05-05 DIAGNOSIS — F320 Major depressive disorder, single episode, mild: Secondary | ICD-10-CM

## 2020-05-07 NOTE — Telephone Encounter (Signed)
lvm for scheduling °

## 2020-08-02 ENCOUNTER — Other Ambulatory Visit: Payer: Self-pay | Admitting: Family Medicine

## 2020-08-02 DIAGNOSIS — F320 Major depressive disorder, single episode, mild: Secondary | ICD-10-CM

## 2020-08-27 ENCOUNTER — Other Ambulatory Visit: Payer: Self-pay

## 2020-08-27 ENCOUNTER — Ambulatory Visit (INDEPENDENT_AMBULATORY_CARE_PROVIDER_SITE_OTHER): Payer: Medicare Other | Admitting: Family Medicine

## 2020-08-27 ENCOUNTER — Encounter: Payer: Self-pay | Admitting: Family Medicine

## 2020-08-27 VITALS — BP 112/68 | HR 80 | Temp 98.0°F | Resp 16 | Ht 60.0 in | Wt 155.0 lb

## 2020-08-27 DIAGNOSIS — B182 Chronic viral hepatitis C: Secondary | ICD-10-CM

## 2020-08-27 DIAGNOSIS — M25511 Pain in right shoulder: Secondary | ICD-10-CM

## 2020-08-27 DIAGNOSIS — H8103 Meniere's disease, bilateral: Secondary | ICD-10-CM

## 2020-08-27 DIAGNOSIS — I10 Essential (primary) hypertension: Secondary | ICD-10-CM | POA: Diagnosis not present

## 2020-08-27 DIAGNOSIS — F320 Major depressive disorder, single episode, mild: Secondary | ICD-10-CM | POA: Diagnosis not present

## 2020-08-27 DIAGNOSIS — E669 Obesity, unspecified: Secondary | ICD-10-CM

## 2020-08-27 DIAGNOSIS — J302 Other seasonal allergic rhinitis: Secondary | ICD-10-CM

## 2020-08-27 DIAGNOSIS — E782 Mixed hyperlipidemia: Secondary | ICD-10-CM

## 2020-08-27 DIAGNOSIS — J4530 Mild persistent asthma, uncomplicated: Secondary | ICD-10-CM

## 2020-08-27 DIAGNOSIS — E785 Hyperlipidemia, unspecified: Secondary | ICD-10-CM

## 2020-08-27 DIAGNOSIS — Z780 Asymptomatic menopausal state: Secondary | ICD-10-CM

## 2020-08-27 DIAGNOSIS — Z6830 Body mass index (BMI) 30.0-30.9, adult: Secondary | ICD-10-CM

## 2020-08-27 DIAGNOSIS — R42 Dizziness and giddiness: Secondary | ICD-10-CM

## 2020-08-27 DIAGNOSIS — F439 Reaction to severe stress, unspecified: Secondary | ICD-10-CM

## 2020-08-27 DIAGNOSIS — Z1231 Encounter for screening mammogram for malignant neoplasm of breast: Secondary | ICD-10-CM

## 2020-08-27 DIAGNOSIS — I252 Old myocardial infarction: Secondary | ICD-10-CM

## 2020-08-27 DIAGNOSIS — J45901 Unspecified asthma with (acute) exacerbation: Secondary | ICD-10-CM

## 2020-08-27 DIAGNOSIS — Z5181 Encounter for therapeutic drug level monitoring: Secondary | ICD-10-CM

## 2020-08-27 LAB — LIPID PANEL
Cholesterol: 248 mg/dL — ABNORMAL HIGH (ref <200)
HDL: 41 mg/dL — ABNORMAL LOW (ref >=50)
LDL Cholesterol (Calc): 156 mg/dL (calc) — ABNORMAL HIGH
Non-HDL Cholesterol (Calc): 207 mg/dL (calc) — ABNORMAL HIGH (ref <130)
Total CHOL/HDL Ratio: 6 (calc) — ABNORMAL HIGH (ref <5.0)
Triglycerides: 334 mg/dL — ABNORMAL HIGH (ref <150)

## 2020-08-27 LAB — COMPLETE METABOLIC PANEL WITH GFR
AG Ratio: 2.1 (calc) (ref 1.0–2.5)
ALT: 40 U/L — ABNORMAL HIGH (ref 6–29)
AST: 35 U/L (ref 10–35)
Albumin: 4.8 g/dL (ref 3.6–5.1)
Alkaline phosphatase (APISO): 78 U/L (ref 37–153)
BUN/Creatinine Ratio: 19 (calc) (ref 6–22)
BUN: 23 mg/dL (ref 7–25)
CO2: 33 mmol/L — ABNORMAL HIGH (ref 20–32)
Calcium: 10.2 mg/dL (ref 8.6–10.4)
Chloride: 100 mmol/L (ref 98–110)
Creat: 1.18 mg/dL — ABNORMAL HIGH (ref 0.60–0.93)
GFR, Est African American: 54 mL/min/{1.73_m2} — ABNORMAL LOW (ref >=60)
GFR, Est Non African American: 47 mL/min/{1.73_m2} — ABNORMAL LOW (ref >=60)
Globulin: 2.3 g/dL (calc) (ref 1.9–3.7)
Glucose, Bld: 114 mg/dL — ABNORMAL HIGH (ref 65–99)
Potassium: 4.3 mmol/L (ref 3.5–5.3)
Sodium: 141 mmol/L (ref 135–146)
Total Bilirubin: 0.5 mg/dL (ref 0.2–1.2)
Total Protein: 7.1 g/dL (ref 6.1–8.1)

## 2020-08-27 LAB — CBC WITH DIFFERENTIAL/PLATELET
Absolute Monocytes: 601 cells/uL (ref 200–950)
Basophils Absolute: 31 cells/uL (ref 0–200)
Basophils Relative: 0.5 %
Eosinophils Absolute: 149 cells/uL (ref 15–500)
Eosinophils Relative: 2.4 %
HCT: 42.8 % (ref 35.0–45.0)
Hemoglobin: 14.2 g/dL (ref 11.7–15.5)
Lymphs Abs: 1885 cells/uL (ref 850–3900)
MCH: 30.5 pg (ref 27.0–33.0)
MCHC: 33.2 g/dL (ref 32.0–36.0)
MCV: 91.8 fL (ref 80.0–100.0)
MPV: 11 fL (ref 7.5–12.5)
Monocytes Relative: 9.7 %
Neutro Abs: 3534 cells/uL (ref 1500–7800)
Neutrophils Relative %: 57 %
Platelets: 258 10*3/uL (ref 140–400)
RBC: 4.66 10*6/uL (ref 3.80–5.10)
RDW: 12.9 % (ref 11.0–15.0)
Total Lymphocyte: 30.4 %
WBC: 6.2 10*3/uL (ref 3.8–10.8)

## 2020-08-27 MED ORDER — PREDNISONE 20 MG PO TABS: 40.0000 mg | ORAL_TABLET | Freq: Every day | ORAL | 0 refills | Status: AC

## 2020-08-27 MED ORDER — BUSPIRONE HCL 5 MG PO TABS: 5.0000 mg | ORAL_TABLET | Freq: Two times a day (BID) | ORAL | 2 refills | Status: DC | PRN

## 2020-08-27 MED ORDER — FLUOXETINE HCL 20 MG PO TABS: 20.0000 mg | ORAL_TABLET | Freq: Every day | ORAL | 3 refills | Status: DC

## 2020-08-27 MED ORDER — MONTELUKAST SODIUM 10 MG PO TABS: 10.0000 mg | ORAL_TABLET | Freq: Every day | ORAL | 11 refills | Status: DC

## 2020-08-27 MED ORDER — FLUTICASONE PROPIONATE 50 MCG/ACT NA SUSP: NASAL | 11 refills | Status: DC

## 2020-08-27 MED ORDER — ALBUTEROL SULFATE HFA 108 (90 BASE) MCG/ACT IN AERS: 2.0000 | INHALATION_SPRAY | RESPIRATORY_TRACT | 11 refills | Status: DC | PRN

## 2020-08-27 MED ORDER — FEXOFENADINE HCL 180 MG PO TABS: 180.0000 mg | ORAL_TABLET | Freq: Every day | ORAL | 3 refills | Status: DC

## 2020-08-27 MED ORDER — TRELEGY ELLIPTA 100-62.5-25 MCG/INH IN AEPB: 1.0000 | INHALATION_SPRAY | Freq: Every day | RESPIRATORY_TRACT | 11 refills | Status: DC

## 2020-08-27 MED ORDER — MECLIZINE HCL 25 MG PO TABS: 25.0000 mg | ORAL_TABLET | Freq: Three times a day (TID) | ORAL | 5 refills | Status: DC | PRN

## 2020-08-27 NOTE — Patient Instructions (Addendum)
Health Maintenance  Topic Date Due  . COVID-19 Vaccine (1) Never done  . Mammogram  Never done  . DEXA scan (bone density measurement)  Never done  . Pneumonia vaccines (1 of 2 - PCV13) Never done  . Colon Cancer Screening  10/05/2023  . Tetanus Vaccine  09/25/2028  .  Hepatitis C: One time screening is recommended by Center for Disease Control  (CDC) for  adults born from [birth year] through 1966.   Completed  . Flu Shot  Discontinued   [facility],  [place]  [address] Get Driving Directions Main: [phone]  Call number to make appointment for mammogram and bone density testing  Start trelegy once daily, please let me know if insurance doesn't cover Take steroids and make sure you are on all your allergy and asthma medication Follow up if not improving

## 2020-08-27 NOTE — Progress Notes (Signed)
Name: Tricia Vasquez   MRN: [id number]    DOB: Jul 18, 1951   Date:08/27/2020       Progress Note  Chief Complaint  Patient presents with  . Follow-up  . Medication Refill     Subjective:   Tricia Vasquez is a 69 y.o. female, presents to clinic for med refill No labs for more than 1.5 years  Hx of CVA, MI, hep C, statin intolerant, on atenolol and HCTZ for HTN  Hypertension:  Currently managed on HCTZ and atenolol  Pt reports good med compliance and denies any SE.   Blood pressure today is well controlled. BP Readings from Last 3 Encounters:  08/27/20 112/68  12/18/18 110/78  10/04/18 113/73   Pt denies CP, SOB, exertional sx, LE edema, palpitation, Ha's, visual disturbances, lightheadedness, hypotension, syncope.  No meds for HLD- statin intolerant  Lab Results  Component Value Date   CHOL 235 (H) 12/18/2018   HDL 44 (L) 12/18/2018   LDLCALC 158 (H) 12/18/2018   TRIG 180 (H) 12/18/2018   CHOLHDL 5.3 (H) 12/18/2018   MDD:  Pt has been managing depression/mood with fluoxetine 30 mg daily for several years Depression screen [facility] 2/9 08/27/2020 08/22/2019 12/18/2018  Decreased Interest 2 0 2  Down, Depressed, Hopeless 2 0 1  PHQ - 2 Score 4 0 3  Altered sleeping 3 0 3  Tired, decreased energy 1 0 1  Change in appetite 1 0 1  Feeling bad or failure about yourself  1 0 0  Trouble concentrating 0 0 0  Moving slowly or fidgety/restless 0 0 0  Suicidal thoughts 0 0 0  PHQ-9 Score 10 0 8  Difficult doing work/chores Not difficult at all Not difficult at all Somewhat difficult   PHQ reviewed, score positive, worse than prior OV- she states cause she is now living in a toxic environment for her  Right shoulder pain, limited ROM, onset about 1 year ago, she fell at her daughter's house fell onto her right shoulder has been unable to flex even to 90 degrees has constant pain which has become moderate to severe, waking her up at night.  No eval or xray for this   Current  Outpatient Medications:  .  albuterol (PROVENTIL HFA;VENTOLIN HFA) 108 (90 Base) MCG/ACT inhaler, INHALE 2 PUFFS INTO THE LUNGS EVERY 4 (FOUR) HOURS AS NEEDED FOR WHEEZING OR SHORTNESS OF BREATH., Disp: 8.5 Inhaler, Rfl: 0 .  aspirin EC 81 MG tablet, Take 81 mg by mouth 2 (two) times daily., Disp: , Rfl:  .  atenolol (TENORMIN) 50 MG tablet, TAKE 1 TABLET BY MOUTH EVERY DAY, Disp: 90 tablet, Rfl: 3 .  BLACK WALNUT POLLEN [place], Inject 1 tablet into the skin once., Disp: , Rfl:  .  Cholecalciferol (VITAMIN D) 2000 units tablet, Take 2,000 Units by mouth 3 days., Disp: , Rfl:  .  Cyanocobalamin (VITAMIN B-12) 5000 MCG SUBL, Place 1 tablet under the tongue 3 days., Disp: , Rfl:  .  fexofenadine (ALLEGRA) 180 MG tablet, TAKE 1 TABLET BY MOUTH EVERY DAY, Disp: 90 tablet, Rfl: 0 .  FLUoxetine HCl 60 MG TABS, TAKE 1/2 TABLET BY MOUTH EVERY DAY, Disp: 45 tablet, Rfl: 0 .  fluticasone (FLONASE) 50 MCG/ACT nasal spray, USE 2 SPRAYS DAILY PER NOSTRIL, Disp: 16 g, Rfl: 12 .  GARLIC OIL PO, Take 1 pg/oz/day by mouth once., Disp: , Rfl:  .  hydrochlorothiazide (HYDRODIURIL) 25 MG tablet, TAKE 1 TABLET BY MOUTH EVERY DAY, Disp: 90 tablet, Rfl:  3 .  meclizine (ANTIVERT) 25 MG tablet, Take 1 tablet (25 mg total) by mouth 3 (three) times daily as needed for dizziness., Disp: 60 tablet, Rfl: 5 .  Milk Thistle 250 MG CAPS, Take 1 capsule by mouth once., Disp: , Rfl:  .  Misc Natural Products (DANDELION ROOT PO), Take 1 tablet by mouth once., Disp: , Rfl:  .  montelukast (SINGULAIR) 10 MG tablet, Take 1 tablet (10 mg total) by mouth at bedtime., Disp: 30 tablet, Rfl: 11 .  nitroGLYCERIN (NITROSTAT) 0.4 MG SL tablet, PLACE 1 TAB UNDER THE TONGUE EVERY 5 MINUTES AS NEEDED FOR CHEST PAIN. CALL 911 MAX 3 PER EPISODE, Disp: 25 tablet, Rfl: 0 .  QVAR REDIHALER 80 MCG/ACT inhaler, TAKE 2 PUFFS BY MOUTH TWICE A DAY, Disp: , Rfl:  .  TURMERIC PO, Take 1 tablet by mouth once., Disp: , Rfl:  .  VITAMIN E PO, Take by mouth daily.,  Disp: , Rfl:  .  Wound Dressings (TRIPLE HELIX COLLAGEN) POWD, Apply topically., Disp: , Rfl:   Patient Active Problem List   Diagnosis Date Noted  . Special screening for malignant neoplasms, colon   . Benign neoplasm of transverse colon   . Benign neoplasm of ascending colon   . Apnea, sleep 04/11/2016  . 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 Auditory vertigo 04/11/2016  . Decreased potassium in the blood 04/11/2016  . BP (high blood pressure) 04/11/2016  . Hyperlipidemia 04/11/2016  . HCV (hepatitis C virus) 04/11/2016  . Depression 04/11/2016  . Asthma without status asthmaticus 04/11/2016  . Chronic kidney disease (CKD), stage I 03/24/2016  . Hx of myocardial infarction 03/24/2016  . Hx of hepatitis C 03/24/2016  . Hx of completed stroke 03/24/2016  . Preventative health care 03/24/2016  . 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 Cerebrovascular accident (CVA) ([place]) 08/06/2014  . Headache disorder 08/06/2014    Past Surgical History:  Procedure Laterality Date  . ABDOMINAL HYSTERECTOMY    . COLON SURGERY    . COLONOSCOPY WITH PROPOFOL N/A 10/04/2018   Procedure: COLONOSCOPY WITH PROPOFOL;  Surgeon: Lucilla Lame, MD;  Location: [place];  Service: Endoscopy;  Laterality: N/A;  sleep apnea  . OVARIAN CYST REMOVAL    . POLYPECTOMY  10/04/2018   Procedure: POLYPECTOMY;  Surgeon: Lucilla Lame, MD;  Location: [place];  Service: Endoscopy;;  . TONSILLECTOMY      Family History  Problem Relation Age of Onset  . Hypertension Mother   . Hypertension Father   . Hypertension Sister   . Hypertension Brother     Social History   Tobacco Use  . Smoking status: Former Smoker    Quit date: 1970    Years since quitting: 51.8  . Smokeless tobacco: Never Used  Vaping Use  . Vaping Use: Never used  Substance Use Topics  . Alcohol use: No    Alcohol/week: 0.0 standard drinks    Comment: may have a drink 1x/mo  . Drug use: No     Allergies    Allergen Reactions  . Penicillins Swelling and Rash       . Statins Shortness Of Breath    And chest pain  . Aspirin Other (See Comments)    Tongue swelling    Health Maintenance  Topic Date Due  . COVID-19 Vaccine (1) Never done  . MAMMOGRAM  Never done  . DEXA SCAN  Never done  . PNA vac Low Risk Adult (1 of 2 - PCV13)  Never done  . COLONOSCOPY  10/05/2023  . TETANUS/TDAP  09/25/2028  . Hepatitis C Screening  Completed  . INFLUENZA VACCINE  Discontinued    Chart Review Today: I personally reviewed active problem list, medication list, allergies, family history, social history, health maintenance, notes from last encounter, lab results, imaging with the patient/caregiver today.  Review of Systems  10 Systems reviewed and are negative for acute change except as noted in the HPI.  Objective:   Vitals:   08/27/20 1326  BP: 112/68  Pulse: 80  Resp: 16  Temp: 98 F (36.7 C)  TempSrc: Oral  SpO2: 98%  Weight: 155 lb (70.3 kg)  Height: 5' (1.524 m)    Body mass index is 30.27 kg/m.  Physical Exam Vitals and nursing note reviewed.  Constitutional:      General: She is not in acute distress.    Appearance: Normal appearance. She is well-developed. She is obese. She is not ill-appearing, toxic-appearing or diaphoretic.     Interventions: Face mask in place.  HENT:     Head: Normocephalic and atraumatic.     Right Ear: External ear normal.     Left Ear: External ear normal.  Eyes:     General: Lids are normal. No scleral icterus.       Right eye: No discharge.        Left eye: No discharge.     Conjunctiva/sclera: Conjunctivae normal.  Neck:     Trachea: Phonation normal. No tracheal deviation.  Cardiovascular:     Rate and Rhythm: Normal rate and regular rhythm.     Pulses: Normal pulses.          Radial pulses are 2+ on the right side and 2+ on the left side.       Posterior tibial pulses are 2+ on the right side and 2+ on the left side.     Heart sounds:  Normal heart sounds. No murmur heard.  No friction rub. No gallop.   Pulmonary:     Effort: Pulmonary effort is normal. No respiratory distress.     Breath sounds: No stridor. Wheezing present. No rhonchi or rales.  Chest:     Chest wall: No tenderness.  Abdominal:     General: Bowel sounds are normal. There is no distension.     Palpations: Abdomen is soft.  Musculoskeletal:     Right shoulder: Tenderness present. Decreased range of motion.     Right lower leg: No edema.     Left lower leg: No edema.     Comments: Limited flexion, flexion slightly better in abduction Neg drop test, neg empty can  Skin:    General: Skin is warm and dry.     Coloration: Skin is not jaundiced or pale.     Findings: No rash.  Neurological:     Mental Status: She is alert.     Motor: No abnormal muscle tone.     Gait: Gait normal.  Psychiatric:        Mood and Affect: Mood normal.        Speech: Speech normal.        Behavior: Behavior normal.         Assessment & Plan:     ICD-10-CM   1. Current mild episode of major depressive disorder, unspecified whether recurrent (HCC)  F32.0 FLUoxetine (PROZAC) 20 MG tablet    busPIRone (BUSPAR) 5 MG tablet   pt would like to continue current dose - but  situational stress is worse and phq and gad 7 scores worse, pt is coping  2. Hyperlipidemia, unspecified hyperlipidemia type  E78.5 CBC with Differential/Platelet    COMPLETE METABOLIC PANEL WITH GFR    Lipid panel    Ambulatory referral to Cardiology   statin intolerant with hx of multiple MI's and hx of stroke, last lipids uncontrolled, discussed newer meds - may be able to get with specialist help  3. 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 Hypertension, unspecified type  I10 CBC with Differential/Platelet    COMPLETE METABOLIC PANEL WITH GFR    Lipid panel    Ambulatory referral to Cardiology   well  controlled today, with hx of MI would prefer to have her on ACEI/ARB?   5. Class 1 obesity with serious comorbidity and body mass index (BMI) of 30.0 to 30.9 in adult, unspecified obesity type  E66.9 CBC with Differential/Platelet   J50.09 COMPLETE METABOLIC PANEL WITH GFR    Lipid panel  6. Encounter for screening mammogram for malignant neoplasm of breast  Z12.31 MM 3D SCREEN BREAST BILATERAL  7. Postmenopausal estrogen deficiency  Z78.0 DG Bone Density  8. Chronic hepatitis C without hepatic coma (HCC)  F81.8 COMPLETE METABOLIC PANEL WITH GFR  9. Hx of myocardial infarction  E99.3 COMPLETE METABOLIC PANEL WITH GFR    Lipid panel    Ambulatory referral to Cardiology   est with cardiology locally, needs eval, not on all meds for medical management  10. Situational stress  F43.9 FLUoxetine (PROZAC) 20 MG tablet    busPIRone (BUSPAR) 5 MG tablet  11. Chronic vertigo  R42 meclizine (ANTIVERT) 25 MG tablet   refill  12. Exacerbation of asthma, unspecified asthma severity, unspecified whether persistent  J45.901 montelukast (SINGULAIR) 10 MG tablet    albuterol (VENTOLIN HFA) 108 (90 Base) MCG/ACT inhaler    predniSONE (DELTASONE) 20 MG tablet    Fluticasone-Umeclidin-Vilant (TRELEGY ELLIPTA) 100-62.5-25 MCG/INH AEPB   wheezy, steroid burst, trial of trelegy, discussed control of asthma, close f/up  13. Meniere's disease of both ears  H81.03 meclizine (ANTIVERT) 25 MG tablet   well controlled, med refills requested by pt  14. Encounter for medication monitoring  Z51.81   15. Seasonal allergies  J30.2 fexofenadine (ALLEGRA) 180 MG tablet    fluticasone (FLONASE) 50 MCG/ACT nasal spray  16. Right shoulder pain, unspecified chronicity  M25.511 Ambulatory referral to Orthopedics   onset a year ago, limited ROM, ref to ortho   First time seeing pt in person Multiple uncontrolled conditions, a lot of past med hx to update in chart, acute issues addressed as well today - right shoulder pain and  asthma exacerbation  Greater than 50% of this visit was spent in direct face-to-face counseling, obtaining history and physical, discussing and educating pt on treatment plan.  Total time of this visit was 45+ min.  Remainder of time involved but was not limited to reviewing chart (recent and pertinent OV notes and labs), documentation in EMR, and coordinating care and treatment plan.   Return in about 3 months (around 11/27/2020) for Routine follow-up, Needs MWV as well.   Delsa Grana, PA-C 08/27/20 1:36 PM

## 2020-09-07 ENCOUNTER — Other Ambulatory Visit: Payer: Self-pay | Admitting: Family Medicine

## 2020-09-07 DIAGNOSIS — F439 Reaction to severe stress, unspecified: Secondary | ICD-10-CM

## 2020-09-07 DIAGNOSIS — F320 Major depressive disorder, single episode, mild: Secondary | ICD-10-CM

## 2020-09-10 ENCOUNTER — Ambulatory Visit (INDEPENDENT_AMBULATORY_CARE_PROVIDER_SITE_OTHER): Payer: Medicare Other | Admitting: Cardiology

## 2020-09-10 ENCOUNTER — Encounter: Payer: Self-pay | Admitting: Cardiology

## 2020-09-10 ENCOUNTER — Other Ambulatory Visit: Payer: Self-pay

## 2020-09-10 VITALS — BP 120/70 | HR 60 | Ht 60.0 in | Wt 161.0 lb

## 2020-09-10 DIAGNOSIS — I252 Old myocardial infarction: Secondary | ICD-10-CM | POA: Diagnosis not present

## 2020-09-10 DIAGNOSIS — I10 Essential (primary) hypertension: Secondary | ICD-10-CM | POA: Diagnosis not present

## 2020-09-10 DIAGNOSIS — E7800 Pure hypercholesterolemia, unspecified: Secondary | ICD-10-CM | POA: Diagnosis not present

## 2020-09-10 DIAGNOSIS — I2510 Atherosclerotic heart disease of native coronary artery without angina pectoris: Secondary | ICD-10-CM

## 2020-09-10 MED ORDER — EZETIMIBE 10 MG PO TABS: 10.0000 mg | ORAL_TABLET | Freq: Every day | ORAL | 5 refills | Status: DC

## 2020-09-10 NOTE — Progress Notes (Signed)
Cardiology Office Note:    Date:  09/10/2020   ID:  Tricia Vasquez, DOB 10/25/1951, MRN [id number]  PCP:  Delsa Grana, PA-C  [facility] Cardiologist:  Kate Sable, MD  [place] Electrophysiologist:  None   Referring MD: Delsa Grana, PA-C   Chief Complaint  Patient presents with  . New Patient (Initial Visit)    Referred by PCP for Hx of MI, HTN, and Hyperlipidemia  Pt c/o SOB due to asthma; no other Sx. Wants to establish care.    History of Present Illness:    Tricia Vasquez is a 69 y.o. female with a hx of MI x2 in 77s, 89s, hypertension, hyperlipidemia, TIA's who presents to establish care.  Patient states having at least 3 MIs in the 69s or 90s.  No intervention such as balloon angioplasty or stenting was done.  She was in [place] at the time.  Since then, she has not followed up with cardiology March.  Denies any chest pain or shortness of breath at rest or with exertion.  She has tried multiple statins in the past but could not tolerate due to muscle aches, overall malaise.  She also has allergies to aspirin.  She has been compliant with her blood pressure regimen including atenolol and HCTZ for years now.  Echo 07/2014 showed normal systolic function, normal diastolic function, EF 60 to 65%  Past Medical History:  Diagnosis Date  . Chronic kidney disease    stage 1  . Depression   . Heart attack ([place])    (x2) 1980s and 1990s  . Hepatitis C   . Hyperlipidemia   . Hypertension   . 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 Osteoporosis   . Sleep apnea    No CPAP  . Stroke [facility]) 2013    Past Surgical History:  Procedure Laterality Date  . ABDOMINAL HYSTERECTOMY    . COLON SURGERY    . COLONOSCOPY WITH PROPOFOL N/A 10/04/2018   Procedure: COLONOSCOPY WITH PROPOFOL;  Surgeon: Lucilla Lame, MD;  Location: [place];  Service: Endoscopy;  Laterality: N/A;  sleep apnea  . OVARIAN CYST REMOVAL    . POLYPECTOMY  10/04/2018   Procedure:  POLYPECTOMY;  Surgeon: Lucilla Lame, MD;  [facility];  Service: Endoscopy;;  . TONSILLECTOMY      Current Medications: Current Meds  Medication Sig  . albuterol (VENTOLIN HFA) 108 (90 Base) MCG/ACT inhaler Inhale 2 puffs into the lungs every 4 (four) hours as needed for wheezing or shortness of breath.  Marland Kitchen aspirin EC 81 MG tablet Take 81 mg by mouth 2 (two) times daily.  Marland Kitchen atenolol (TENORMIN) 50 MG tablet TAKE 1 TABLET BY MOUTH EVERY DAY  . BLACK WALNUT POLLEN [place] Inject 1 tablet into the skin once.  . busPIRone (BUSPAR) 5 MG tablet Take 1-2 tablets (5-10 mg total) by mouth 2 (two) times daily as needed (for anxiety, stress, agitation).  . Cholecalciferol (VITAMIN D) 2000 units tablet Take 2,000 Units by mouth 3 days.  . Cyanocobalamin (VITAMIN B-12) 5000 MCG SUBL Place 1 tablet under the tongue 3 days.  . fexofenadine (ALLEGRA) 180 MG tablet Take 1 tablet (180 mg total) by mouth daily.  Marland Kitchen FLUoxetine (PROZAC) 20 MG tablet Take 1 tablet (20 mg total) by mouth daily.  . fluticasone (FLONASE) 50 MCG/ACT nasal spray USE 2 SPRAYS DAILY PER NOSTRIL  . Fluticasone-Umeclidin-Vilant (TRELEGY ELLIPTA) 100-62.5-25 MCG/INH AEPB Inhale 1 puff into the lungs daily.  Marland Kitchen GARLIC OIL PO Take  1 pg/oz/day by mouth once.  . hydrochlorothiazide (HYDRODIURIL) 25 MG tablet TAKE 1 TABLET BY MOUTH EVERY DAY  . meclizine (ANTIVERT) 25 MG tablet Take 1 tablet (25 mg total) by mouth 3 (three) times daily as needed for dizziness.  . Milk Thistle 250 MG CAPS Take 1 capsule by mouth once.  . Misc Natural Products (DANDELION ROOT PO) Take 1 tablet by mouth once.  . montelukast (SINGULAIR) 10 MG tablet Take 1 tablet (10 mg total) by mouth at bedtime.  . nitroGLYCERIN (NITROSTAT) 0.4 MG SL tablet PLACE 1 TAB UNDER THE TONGUE EVERY 5 MINUTES AS NEEDED FOR CHEST PAIN. CALL 911 MAX 3 PER EPISODE  . TURMERIC PO Take 1 tablet by mouth once.  Marland Kitchen VITAMIN E PO Take by mouth daily.  . Wound Dressings (TRIPLE HELIX  COLLAGEN) POWD Apply topically.     Allergies:   Penicillins, Statins, and Aspirin   Social History   Socioeconomic History  . Marital status: Divorced    Spouse name: Not on file  . Number of children: Not on file  . Years of education: Not on file  . Highest education level: Not on file  Occupational History  . Not on file  Tobacco Use  . Smoking status: Former Smoker    Quit date: 1970    Years since quitting: 51.8  . Smokeless tobacco: Never Used  Vaping Use  . Vaping Use: Never used  Substance and Sexual Activity  . Alcohol use: No    Alcohol/week: 0.0 standard drinks    Comment: may have a drink 1x/mo  . Drug use: No  . Sexual activity: Not Currently  Other Topics Concern  . Not on file  Social History Narrative  . Not on file   Social Determinants of Health   Financial Resource Strain:   . Difficulty of Paying Living Expenses: Not on file  Food Insecurity:   . Worried About Charity fundraiser in the Last Year: Not on file  . Ran Out of Food in the Last Year: Not on file  Transportation Needs:   . Lack of Transportation (Medical): Not on file  . Lack of Transportation (Non-Medical): Not on file  Physical Activity:   . Days of Exercise per Week: Not on file  . Minutes of Exercise per Session: Not on file  Stress:   . Feeling of Stress : Not on file  Social Connections:   . Frequency of Communication with Friends and Family: Not on file  . Frequency of Social Gatherings with Friends and Family: Not on file  . Attends Religious Services: Not on file  . Active Member of Clubs or Organizations: Not on file  . Attends Archivist Meetings: Not on file  . Marital Status: Not on file     Family History: The patient's family history includes Hypertension in her brother, father, mother, and sister.  ROS:   Please see the history of present illness.     All other systems reviewed and are negative.  EKGs/Labs/Other Studies Reviewed:    The  following studies were reviewed today:   EKG:  EKG is  ordered today.  The ekg ordered today demonstrates normal sinus rhythm  Recent Labs: 08/27/2020: ALT 40; BUN 23; Creat 1.18; Hemoglobin 14.2; Platelets 258; Potassium 4.3; Sodium 141  Recent Lipid Panel    Component Value Date/Time   CHOL 248 (H) 08/27/2020 1416   TRIG 334 (H) 08/27/2020 1416   HDL 41 (L) 08/27/2020 1416  CHOLHDL 6.0 (H) 08/27/2020 1416   LDLCALC 156 (H) 08/27/2020 1416     Risk Assessment/Calculations:      Physical Exam:    VS:  BP 120/70   Pulse 60   Ht 5' (1.524 m)   Wt 161 lb (73 kg)   BMI 31.44 kg/m     Wt Readings from Last 3 Encounters:  09/10/20 161 lb (73 kg)  08/27/20 155 lb (70.3 kg)  12/18/18 161 lb 6.4 oz (73.2 kg)     GEN:  Well nourished, well developed in no acute distress HEENT: Normal NECK: No JVD; No carotid bruits LYMPHATICS: No lymphadenopathy CARDIAC: RRR, no murmurs, rubs, gallops RESPIRATORY:  Clear to auscultation without rales, wheezing or rhonchi  ABDOMEN: Soft, non-tender, non-distended MUSCULOSKELETAL:  No edema; No deformity  SKIN: Warm and dry NEUROLOGIC:  Alert and oriented x 3 PSYCHIATRIC:  Normal affect   ASSESSMENT:    1. Coronary artery disease involving native heart, unspecified vessel or lesion type, unspecified whether angina present   2. Hx of myocardial infarction   3. Primary hypertension   4. Pure hypercholesterolemia    PLAN:    In order of problems listed above:  1. Patient states having history of MI in the 57s and 90s.  Not tolerant to aspirin or statins.  PCSK9 inhibitor was offered to patient but she states not liking needles.  We will start Zetia 10 mg daily.  Get echocardiogram.  Currently has no symptoms of chest pain or shortness of breath. 2. Hypertension, blood pressure controlled.  Continue HCTZ and atenolol. 3. History of hyperlipidemia, LDL not at goal, Zetia as above.  Follow up after echocardiogram.   Shared Decision  Making/Informed Consent      Medication Adjustments/Labs and Tests Ordered: Current medicines are reviewed at length with the patient today.  Concerns regarding medicines are outlined above.  Orders Placed This Encounter  Procedures  . EKG 12-Lead  . ECHOCARDIOGRAM COMPLETE   Meds ordered this encounter  Medications  . ezetimibe (ZETIA) 10 MG tablet    Sig: Take 1 tablet (10 mg total) by mouth daily.    Dispense:  30 tablet    Refill:  5    Patient Instructions  Medication Instructions:  Your physician has recommended you make the following change in your medication:   1.  START taking ezetimibe (ZETIA) 10 MG tablet:  Take 1 tablet (10 mg total) by mouth daily.  *If you need a refill on your cardiac medications before your next appointment, please call your pharmacy*   Lab Work: None Ordered If you have labs (blood work) drawn today and your tests are completely normal, you will receive your results only by: Marland Kitchen MyChart Message (if you have MyChart) OR . A paper copy in the mail If you have any lab test that is abnormal or we need to change your treatment, we will call you to review the results.   Testing/Procedures:  1.  Your physician has requested that you have an echocardiogram. Echocardiography is a painless test that uses sound waves to create images of your heart. It provides your doctor with information about the size and shape of your heart and how well your heart's chambers and valves are working. This procedure takes approximately one hour. There are no restrictions for this procedure.    Follow-Up: At [facility], you and your health needs are our priority.  As part of our continuing mission to provide you with exceptional heart care, we have  created designated Provider Care Teams.  These Care Teams include your primary Cardiologist (physician) and Advanced Practice Providers (APPs -  Physician Assistants and Nurse Practitioners) who all work together to  provide you with the care you need, when you need it.  We recommend signing up for the patient portal called "MyChart".  Sign up information is provided on this After Visit Summary.  MyChart is used to connect with patients for Virtual Visits (Telemedicine).  Patients are able to view lab/test results, encounter notes, upcoming appointments, etc.  Non-urgent messages can be sent to your provider as well.   To learn more about what you can do with MyChart, go to [email or web address].    Your next appointment:   Follow up after Echo   The format for your next appointment:   In Person  Provider:   Kate Sable, MD   Other Instructions       Signed, Kate Sable, MD  09/10/2020 4:59 PM    [place]

## 2020-09-10 NOTE — Patient Instructions (Signed)
Medication Instructions:  Your physician has recommended you make the following change in your medication:   1.  START taking ezetimibe (ZETIA) 10 MG tablet:  Take 1 tablet (10 mg total) by mouth daily.  *If you need a refill on your cardiac medications before your next appointment, please call your pharmacy*   Lab Work: None Ordered If you have labs (blood work) drawn today and your tests are completely normal, you will receive your results only by: Marland Kitchen MyChart Message (if you have MyChart) OR . A paper copy in the mail If you have any lab test that is abnormal or we need to change your treatment, we will call you to review the results.   Testing/Procedures:  1.  Your physician has requested that you have an echocardiogram. Echocardiography is a painless test that uses sound waves to create images of your heart. It provides your doctor with information about the size and shape of your heart and how well your heart's chambers and valves are working. This procedure takes approximately one hour. There are no restrictions for this procedure.    Follow-Up: At [facility], you and your health needs are our priority.  As part of our continuing mission to provide you with exceptional heart care, we have created designated Provider Care Teams.  These Care Teams include your primary Cardiologist (physician) and Advanced Practice Providers (APPs -  Physician Assistants and Nurse Practitioners) who all work together to provide you with the care you need, when you need it.  We recommend signing up for the patient portal called "MyChart".  Sign up information is provided on this After Visit Summary.  MyChart is used to connect with patients for Virtual Visits (Telemedicine).  Patients are able to view lab/test results, encounter notes, upcoming appointments, etc.  Non-urgent messages can be sent to your provider as well.   To learn more about what you can do with MyChart, go to [email or web address].     Your next appointment:   Follow up after Echo   The format for your next appointment:   In Person  Provider:   Kate Sable, MD   Other Instructions

## 2020-09-20 ENCOUNTER — Other Ambulatory Visit: Payer: Self-pay | Admitting: Family Medicine

## 2020-09-20 DIAGNOSIS — F320 Major depressive disorder, single episode, mild: Secondary | ICD-10-CM

## 2020-10-06 ENCOUNTER — Other Ambulatory Visit: Payer: Self-pay

## 2020-10-06 ENCOUNTER — Ambulatory Visit (INDEPENDENT_AMBULATORY_CARE_PROVIDER_SITE_OTHER): Payer: Medicare Other

## 2020-10-06 DIAGNOSIS — I2510 Atherosclerotic heart disease of native coronary artery without angina pectoris: Secondary | ICD-10-CM | POA: Diagnosis not present

## 2020-10-07 LAB — ECHOCARDIOGRAM COMPLETE
Area-P 1/2: 4.99 cm2
S' Lateral: 2.5 cm

## 2020-10-09 ENCOUNTER — Telehealth: Payer: Self-pay | Admitting: *Deleted

## 2020-10-09 NOTE — Telephone Encounter (Signed)
-----   Message from Kate Sable, MD sent at 10/07/2020  5:40 PM EST ----- Normal systolic function, mild MR.  No gross abnormalities noted.  Echocardiogram appears stable compared to prior from 2015

## 2020-10-09 NOTE — Telephone Encounter (Signed)
No answer. Left message to call back.   

## 2020-10-15 ENCOUNTER — Ambulatory Visit: Payer: Federal, State, Local not specified - PPO | Admitting: Cardiology

## 2020-10-19 ENCOUNTER — Ambulatory Visit: Payer: Federal, State, Local not specified - PPO | Admitting: Cardiology

## 2020-10-19 NOTE — Telephone Encounter (Signed)
The patient has been notified of the result and verbalized understanding.  All questions (if any) were answered. Eliberto Ivory Bernice Mcauliffe, RN 10/19/2020 8:41 AM

## 2020-11-03 ENCOUNTER — Ambulatory Visit: Payer: Medicare Other | Admitting: Cardiology

## 2020-11-12 ENCOUNTER — Ambulatory Visit: Payer: Medicare Other | Admitting: Physician Assistant

## 2020-11-23 ENCOUNTER — Ambulatory Visit: Payer: Medicare Other | Admitting: Cardiology

## 2020-11-27 ENCOUNTER — Encounter: Payer: Self-pay | Admitting: Family Medicine

## 2020-11-27 ENCOUNTER — Other Ambulatory Visit: Payer: Self-pay

## 2020-11-27 ENCOUNTER — Telehealth (INDEPENDENT_AMBULATORY_CARE_PROVIDER_SITE_OTHER): Payer: Medicare Other | Admitting: Family Medicine

## 2020-11-27 VITALS — Ht 60.0 in | Wt 160.0 lb

## 2020-11-27 DIAGNOSIS — I10 Essential (primary) hypertension: Secondary | ICD-10-CM | POA: Diagnosis not present

## 2020-11-27 DIAGNOSIS — F320 Major depressive disorder, single episode, mild: Secondary | ICD-10-CM | POA: Diagnosis not present

## 2020-11-27 DIAGNOSIS — Z5181 Encounter for therapeutic drug level monitoring: Secondary | ICD-10-CM | POA: Diagnosis not present

## 2020-11-27 NOTE — Progress Notes (Signed)
Name: Tricia Vasquez   MRN: [id number]    DOB: 1951-08-26   Date:11/27/2020       Progress Note  Subjective:    Chief Complaint  Chief Complaint  Patient presents with  . Follow-up  . Depression  . Hypertension    I connected with  [name] on 11/27/20 at  1:40 PM EST by telephone and verified that I am speaking with the correct person using two identifiers.   I discussed the limitations, risks, security and privacy concerns of performing an evaluation and management service by telephone and the availability of in person appointments. Staff also discussed with the patient that there may be a patient responsible charge related to this service.  Patient verbalized understanding and agreed to proceed with encounter. Patient Location:  home Provider Location: [facility] clinic  Additional Individuals present: none  HPI  Asthma - attack today before visit, on trelegy and has albuterol rescue- weather changes irritates it, first attack   Depression - on prozac 20 mg  Depression screen [facility] 2/9 11/27/2020 08/27/2020 08/22/2019  Decreased Interest 0 2 0  Down, Depressed, Hopeless 0 2 0  PHQ - 2 Score 0 4 0  Altered sleeping 1 3 0  Tired, decreased energy 1 1 0  Change in appetite 0 1 0  Feeling bad or failure about yourself  0 1 0  Trouble concentrating 0 0 0  Moving slowly or fidgety/restless 0 0 0  Suicidal thoughts 0 0 0  PHQ-9 Score 2 10 0  Difficult doing work/chores Not difficult at all Not difficult at all Not difficult at all   HTN - did not check today but usually it is 124/75  HLD - high levels, has not tolerated statins and zetia - consulted with Dr. Garen Lah   Hepatitis in 1986 in [place] - got through blood transfusion and surgery     Patient Active Problem List   Diagnosis Date Noted  . Special screening for malignant neoplasms, colon   . Benign neoplasm of transverse colon   . Benign neoplasm of ascending colon   . Cervical radiculopathy 12/22/2017   . GAD (generalized anxiety disorder) 09/30/2016  . Moderate persistent asthma without complication 99991111  . Apnea, sleep 04/11/2016  . 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 Auditory vertigo 04/11/2016  . Decreased potassium in the blood 04/11/2016  . BP (high blood pressure) 04/11/2016  . Hyperlipidemia 04/11/2016  . Chronic hepatitis C without hepatic coma ([employer]) 04/11/2016  . Depression 04/11/2016  . Asthma without status asthmaticus 04/11/2016  . Chronic kidney disease (CKD), stage I 03/24/2016  . Hx of myocardial infarction 03/24/2016  . Hx of hepatitis C 03/24/2016  . Hx of completed stroke 03/24/2016  . Preventative health care 03/24/2016  . 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 Cerebrovascular accident (CVA) ([place]) 08/06/2014  . Headache disorder 08/06/2014    Social History   Tobacco Use  . Smoking status: Former Smoker    Quit date: 1970    Years since quitting: 52.1  . Smokeless tobacco: Never Used  Substance Use Topics  . Alcohol use: No    Alcohol/week: 0.0 standard drinks    Comment: may have a drink 1x/mo     Current Outpatient Medications:  .  albuterol (VENTOLIN HFA) 108 (90 Base) MCG/ACT inhaler, Inhale 2 puffs into the lungs every 4 (four) hours as needed for wheezing or shortness of breath., Disp: 8 g, Rfl: 11 .  aspirin EC 81 MG tablet, Take 81 mg  by mouth 2 (two) times daily., Disp: , Rfl:  .  atenolol (TENORMIN) 50 MG tablet, TAKE 1 TABLET BY MOUTH EVERY DAY, Disp: 90 tablet, Rfl: 3 .  BLACK WALNUT POLLEN [place], Inject 1 tablet into the skin once., Disp: , Rfl:  .  busPIRone (BUSPAR) 5 MG tablet, Take 1-2 tablets (5-10 mg total) by mouth 2 (two) times daily as needed (for anxiety, stress, agitation)., Disp: 60 tablet, Rfl: 2 .  Cholecalciferol (VITAMIN D) 2000 units tablet, Take 2,000 Units by mouth 3 days., Disp: , Rfl:  .  Cyanocobalamin (VITAMIN B-12) 5000 MCG SUBL, Place 1 tablet under the tongue 3 days., Disp: , Rfl:  .   fexofenadine (ALLEGRA) 180 MG tablet, Take 1 tablet (180 mg total) by mouth daily., Disp: 90 tablet, Rfl: 3 .  FLUoxetine (PROZAC) 20 MG tablet, Take 1 tablet (20 mg total) by mouth daily., Disp: 90 tablet, Rfl: 3 .  fluticasone (FLONASE) 50 MCG/ACT nasal spray, USE 2 SPRAYS DAILY PER NOSTRIL, Disp: 16 g, Rfl: 11 .  Fluticasone-Umeclidin-Vilant (TRELEGY ELLIPTA) 100-62.5-25 MCG/INH AEPB, Inhale 1 puff into the lungs daily., Disp: 28 each, Rfl: 11 .  GARLIC OIL PO, Take 1 pg/oz/day by mouth once., Disp: , Rfl:  .  hydrochlorothiazide (HYDRODIURIL) 25 MG tablet, TAKE 1 TABLET BY MOUTH EVERY DAY, Disp: 90 tablet, Rfl: 3 .  meclizine (ANTIVERT) 25 MG tablet, Take 1 tablet (25 mg total) by mouth 3 (three) times daily as needed for dizziness., Disp: 60 tablet, Rfl: 5 .  Milk Thistle 250 MG CAPS, Take 1 capsule by mouth once., Disp: , Rfl:  .  Misc Natural Products (DANDELION ROOT PO), Take 1 tablet by mouth once., Disp: , Rfl:  .  montelukast (SINGULAIR) 10 MG tablet, Take 1 tablet (10 mg total) by mouth at bedtime., Disp: 30 tablet, Rfl: 11 .  nitroGLYCERIN (NITROSTAT) 0.4 MG SL tablet, PLACE 1 TAB UNDER THE TONGUE EVERY 5 MINUTES AS NEEDED FOR CHEST PAIN. CALL 911 MAX 3 PER EPISODE, Disp: 25 tablet, Rfl: 0 .  TURMERIC PO, Take 1 tablet by mouth once., Disp: , Rfl:  .  VITAMIN E PO, Take by mouth daily., Disp: , Rfl:  .  Wound Dressings (TRIPLE HELIX COLLAGEN) POWD, Apply topically., Disp: , Rfl:  .  ezetimibe (ZETIA) 10 MG tablet, Take 1 tablet (10 mg total) by mouth daily. (Patient not taking: Reported on 11/27/2020), Disp: 30 tablet, Rfl: 5  Allergies  Allergen Reactions  . Penicillins Swelling and Rash       . Statins Shortness Of Breath    And chest pain  . Aspirin Other (See Comments)    Tongue swelling    Chart Review: I personally reviewed active problem list, medication list, allergies, family history, social history, health maintenance, notes from last encounter, lab results, imaging  with the patient/caregiver today.   Review of Systems 10 Systems reviewed and are negative for acute change except as noted in the HPI.   Objective:    Virtual encounter, vitals limited, only able to obtain the following Today's Vitals   11/27/20 1227  Weight: 160 lb (72.6 kg)  Height: 5' (1.524 m)   Body mass index is 31.25 kg/m. Nursing Note and Vital Signs reviewed.  Physical Exam Vitals and nursing note reviewed.  Neurological:     Mental Status: She is alert.  Psychiatric:        Mood and Affect: Mood normal.        Behavior: Behavior normal.  Depression screen [facility] 2/9 11/27/2020 08/27/2020 08/22/2019  Decreased Interest 0 2 0  Down, Depressed, Hopeless 0 2 0  PHQ - 2 Score 0 4 0  Altered sleeping 1 3 0  Tired, decreased energy 1 1 0  Change in appetite 0 1 0  Feeling bad or failure about yourself  0 1 0  Trouble concentrating 0 0 0  Moving slowly or fidgety/restless 0 0 0  Suicidal thoughts 0 0 0  PHQ-9 Score 2 10 0  Difficult doing work/chores Not difficult at all Not difficult at all Not difficult at all     PE limited by telephone encounter  No results found for this or any previous visit (from the past 72 hour(s)).  Assessment and Plan:     ICD-10-CM   1. Hypertension, unspecified type  I10    Unable to assess with no vital signs and telephone encounter today encouraged her to come in person  2. Encounter for medication monitoring  Z51.81   3. Current mild episode of major depressive disorder, unspecified whether recurrent ([place])  F32.0    phq neg - reviewed, improved relative to last ov, continue prozac 20 mg   Return in about 3 months (around 02/25/2021) for Routine follow-up - in person for HTN/HLD/hepatitis/CKD.    - I discussed the assessment and treatment plan with the patient. The patient was provided an opportunity to ask questions and all were answered. The patient agreed with the plan and demonstrated an understanding of the  instructions.  - The patient was advised to call back or seek an in-person evaluation if the symptoms worsen or if the condition fails to improve as anticipated.  I provided 20+ minutes of non-face-to-face time during this encounter.  Delsa Grana, PA-C 11/27/20 2:12 PM

## 2020-11-30 ENCOUNTER — Ambulatory Visit: Payer: Medicare Other | Admitting: Cardiology

## 2020-12-14 ENCOUNTER — Ambulatory Visit: Payer: Medicare Other | Admitting: Cardiology

## 2020-12-22 ENCOUNTER — Telehealth: Payer: Self-pay | Admitting: Family Medicine

## 2020-12-22 NOTE — Telephone Encounter (Signed)
Left message for patient to call back and schedule Medicare Annual Wellness Visit (AWV) in office.   If unable to come into the office for AWV,  please offer to do virtually or by telephone.  No hx of AWV eligible as of 12/01/2018 awvi  Please schedule at anytime with [facility].      40 Minutes appointment   Any questions, please call me at [phone]

## 2021-01-25 ENCOUNTER — Other Ambulatory Visit: Payer: Self-pay | Admitting: Family Medicine

## 2021-01-25 DIAGNOSIS — I10 Essential (primary) hypertension: Secondary | ICD-10-CM

## 2021-01-25 DIAGNOSIS — I252 Old myocardial infarction: Secondary | ICD-10-CM

## 2021-02-08 ENCOUNTER — Telehealth: Payer: Self-pay | Admitting: Family Medicine

## 2021-02-08 NOTE — Telephone Encounter (Signed)
Copied from [place] [id number]. Topic: Medicare AWV >> Feb 08, 2021 11:24 AM Cher Nakai R wrote: Reason for CRM:  Left message for patient to call back and schedule Medicare Annual Wellness Visit (AWV) in office.   If unable to come into the office for AWV,  please offer to do virtually or by telephone.  No hx of AWV eligible as of 12/01/2018  Please schedule at anytime with [facility].      40 Minutes appointment   Any questions, please call me at [phone]

## 2021-04-01 ENCOUNTER — Ambulatory Visit (INDEPENDENT_AMBULATORY_CARE_PROVIDER_SITE_OTHER): Payer: Medicare Other

## 2021-04-01 VITALS — BP 132/72 | Ht 60.0 in | Wt 160.0 lb

## 2021-04-01 DIAGNOSIS — Z0000 Encounter for general adult medical examination without abnormal findings: Secondary | ICD-10-CM | POA: Diagnosis not present

## 2021-04-01 NOTE — Patient Instructions (Signed)
Ms. Tricia Vasquez , Thank you for taking time to come for your Medicare Wellness Visit. I appreciate your ongoing commitment to your health goals. Please review the following plan we discussed and let me know if I can assist you in the future.   Screening recommendations/referrals: Colonoscopy: done 10/04/18. Repeat in 2024 Mammogram: Please call [phone] to schedule your mammogram and bone density screening.  Bone Density: due Recommended yearly ophthalmology/optometry visit for glaucoma screening and checkup Recommended yearly dental visit for hygiene and checkup  Vaccinations: Influenza vaccine: declined Pneumococcal vaccine: declined Tdap vaccine: done 09/25/18 Shingles vaccine: Shingrix discussed. Please contact your pharmacy for coverage information.  Covid-19: declined  Advanced directives: Advance directive discussed with you today. I have provided a copy for you to complete at home and have notarized. Once this is complete please bring a copy in to our office so we can scan it into your chart.  Conditions/risks identified: Recommend increasing physical activity to at least 3 days per week   Next appointment: Follow up in one year for your annual wellness visit    Preventive Care 65 Years and Older, Female Preventive care refers to lifestyle choices and visits with your health care provider that can promote health and wellness. What does preventive care include?  A yearly physical exam. This is also called an annual well check.  Dental exams once or twice a year.  Routine eye exams. Ask your health care provider how often you should have your eyes checked.  Personal lifestyle choices, including:  Daily care of your teeth and gums.  Regular physical activity.  Eating a healthy diet.  Avoiding tobacco and drug use.  Limiting alcohol use.  Practicing safe sex.  Taking low-dose aspirin every day.  Taking vitamin and mineral supplements as recommended by your health  care provider. What happens during an annual well check? The services and screenings done by your health care provider during your annual well check will depend on your age, overall health, lifestyle risk factors, and family history of disease. Counseling  Your health care provider may ask you questions about your:  Alcohol use.  Tobacco use.  Drug use.  Emotional well-being.  Home and relationship well-being.  Sexual activity.  Eating habits.  History of falls.  Memory and ability to understand (cognition).  Work and work Statistician.  Reproductive health. Screening  You may have the following tests or measurements:  Height, weight, and BMI.  Blood pressure.  Lipid and cholesterol levels. These may be checked every 5 years, or more frequently if you are over 79 years old.  Skin check.  Lung cancer screening. You may have this screening every year starting at age 14 if you have a 30-pack-year history of smoking and currently smoke or have quit within the past 15 years.  Fecal occult blood test (FOBT) of the stool. You may have this test every year starting at age 54.  Flexible sigmoidoscopy or colonoscopy. You may have a sigmoidoscopy every 5 years or a colonoscopy every 10 years starting at age 24.  Hepatitis C blood test.  Hepatitis B blood test.  Sexually transmitted disease (STD) testing.  Diabetes screening. This is done by checking your blood sugar (glucose) after you have not eaten for a while (fasting). You may have this done every 1-3 years.  Bone density scan. This is done to screen for osteoporosis. You may have this done starting at age 33.  Mammogram. This may be done every 1-2 years. Talk to your health  care provider about how often you should have regular mammograms. Talk with your health care provider about your test results, treatment options, and if necessary, the need for more tests. Vaccines  Your health care provider may recommend certain  vaccines, such as:  Influenza vaccine. This is recommended every year.  Tetanus, diphtheria, and acellular pertussis (Tdap, Td) vaccine. You may need a Td booster every 10 years.  Zoster vaccine. You may need this after age 70.  Pneumococcal 13-valent conjugate (PCV13) vaccine. One dose is recommended after age 51.  Pneumococcal polysaccharide (PPSV23) vaccine. One dose is recommended after age 14. Talk to your health care provider about which screenings and vaccines you need and how often you need them. This information is not intended to replace advice given to you by your health care provider. Make sure you discuss any questions you have with your health care provider. Document Released: 11/13/2015 Document Revised: 07/06/2016 Document Reviewed: 08/18/2015 Elsevier Interactive Patient Education  2017 [place] Prevention in the Home Falls can cause injuries. They can happen to people of all ages. There are many things you can do to make your home safe and to help prevent falls. What can I do on the outside of my home?  Regularly fix the edges of walkways and driveways and fix any cracks.  Remove anything that might make you trip as you walk through a door, such as a raised step or threshold.  Trim any bushes or trees on the path to your home.  Use bright outdoor lighting.  Clear any walking paths of anything that might make someone trip, such as rocks or tools.  Regularly check to see if handrails are loose or broken. Make sure that both sides of any steps have handrails.  Any raised decks and porches should have guardrails on the edges.  Have any leaves, snow, or ice cleared regularly.  Use sand or salt on walking paths during winter.  Clean up any spills in your garage right away. This includes oil or grease spills. What can I do in the bathroom?  Use night lights.  Install grab bars by the toilet and in the tub and shower. Do not use towel bars as grab  bars.  Use non-skid mats or decals in the tub or shower.  If you need to sit down in the shower, use a plastic, non-slip stool.  Keep the floor dry. Clean up any water that spills on the floor as soon as it happens.  Remove soap buildup in the tub or shower regularly.  Attach bath mats securely with double-sided non-slip rug tape.  Do not have throw rugs and other things on the floor that can make you trip. What can I do in the bedroom?  Use night lights.  Make sure that you have a light by your bed that is easy to reach.  Do not use any sheets or blankets that are too big for your bed. They should not hang down onto the floor.  Have a firm chair that has side arms. You can use this for support while you get dressed.  Do not have throw rugs and other things on the floor that can make you trip. What can I do in the kitchen?  Clean up any spills right away.  Avoid walking on wet floors.  Keep items that you use a lot in easy-to-reach places.  If you need to reach something above you, use a strong step stool that has a grab  bar.  Keep electrical cords out of the way.  Do not use floor polish or wax that makes floors slippery. If you must use wax, use non-skid floor wax.  Do not have throw rugs and other things on the floor that can make you trip. What can I do with my stairs?  Do not leave any items on the stairs.  Make sure that there are handrails on both sides of the stairs and use them. Fix handrails that are broken or loose. Make sure that handrails are as long as the stairways.  Check any carpeting to make sure that it is firmly attached to the stairs. Fix any carpet that is loose or worn.  Avoid having throw rugs at the top or bottom of the stairs. If you do have throw rugs, attach them to the floor with carpet tape.  Make sure that you have a light switch at the top of the stairs and the bottom of the stairs. If you do not have them, ask someone to add them for  you. What else can I do to help prevent falls?  Wear shoes that:  Do not have high heels.  Have rubber bottoms.  Are comfortable and fit you well.  Are closed at the toe. Do not wear sandals.  If you use a stepladder:  Make sure that it is fully opened. Do not climb a closed stepladder.  Make sure that both sides of the stepladder are locked into place.  Ask someone to hold it for you, if possible.  Clearly mark and make sure that you can see:  Any grab bars or handrails.  First and last steps.  Where the edge of each step is.  Use tools that help you move around (mobility aids) if they are needed. These include:  Canes.  Walkers.  Scooters.  Crutches.  Turn on the lights when you go into a dark area. Replace any light bulbs as soon as they burn out.  Set up your furniture so you have a clear path. Avoid moving your furniture around.  If any of your floors are uneven, fix them.  If there are any pets around you, be aware of where they are.  Review your medicines with your doctor. Some medicines can make you feel dizzy. This can increase your chance of falling. Ask your doctor what other things that you can do to help prevent falls. This information is not intended to replace advice given to you by your health care provider. Make sure you discuss any questions you have with your health care provider. Document Released: 08/13/2009 Document Revised: 03/24/2016 Document Reviewed: 11/21/2014 Elsevier Interactive Patient Education  2017 [employer].

## 2021-04-01 NOTE — Progress Notes (Signed)
Subjective:   Tricia Vasquez is a 70 y.o. female who presents for an Initial Medicare Annual Wellness Visit.  Virtual Visit via Telephone Note  I connected with  [name] on 04/01/21 at  2:10 PM EDT by telephone and verified that I am speaking with the correct person using two identifiers.  Location: Patient: home Provider: [place] Persons participating in the virtual visit: [name]   I discussed the limitations, risks, security and privacy concerns of performing an evaluation and management service by telephone and the availability of in person appointments. The patient expressed understanding and agreed to proceed.  Interactive audio and video telecommunications were attempted between this nurse and patient, however failed, due to patient having technical difficulties OR patient did not have access to video capability.  We continued and completed visit with audio only.  Some vital signs may be absent or patient reported.   Clemetine Marker, LPN    Review of Systems     Cardiac Risk Factors include: advanced age (>58men, >30 women);hypertension;sedentary lifestyle;obesity (BMI >30kg/m2);dyslipidemia     Objective:    Today's Vitals   04/01/21 1429  BP: 132/72  Weight: 160 lb (72.6 kg)  Height: 5' (1.524 m)   Body mass index is 31.25 kg/m.  Advanced Directives 04/01/2021 10/04/2018 03/24/2016  Does Patient Have a Medical Advance Directive? No No No  Would patient like information on creating a medical advance directive? Yes (MAU/Ambulatory/Procedural Areas - Information given) Yes (MAU/Ambulatory/Procedural Areas - Information given) No - patient declined information    Current Medications (verified) Outpatient Encounter Medications as of 04/01/2021  Medication Sig  . albuterol (VENTOLIN HFA) 108 (90 Base) MCG/ACT inhaler Inhale 2 puffs into the lungs every 4 (four) hours as needed for wheezing or shortness of breath.  Marland Kitchen aspirin EC 81 MG tablet Take  81 mg by mouth 2 (two) times daily.  Marland Kitchen atenolol (TENORMIN) 50 MG tablet TAKE 1 TABLET BY MOUTH EVERY DAY  . BLACK WALNUT POLLEN [place] Inject 1 tablet into the skin once.  . busPIRone (BUSPAR) 5 MG tablet Take 1-2 tablets (5-10 mg total) by mouth 2 (two) times daily as needed (for anxiety, stress, agitation).  . Cholecalciferol (VITAMIN D) 2000 units tablet Take 2,000 Units by mouth daily.  . Cyanocobalamin (VITAMIN B-12) 5000 MCG SUBL Place 1 tablet under the tongue 3 (three) times a week.  . fexofenadine (ALLEGRA) 180 MG tablet Take 1 tablet (180 mg total) by mouth daily.  Marland Kitchen FLUoxetine (PROZAC) 20 MG tablet Take 1 tablet (20 mg total) by mouth daily.  . fluticasone (FLONASE) 50 MCG/ACT nasal spray USE 2 SPRAYS DAILY PER NOSTRIL  . Fluticasone-Umeclidin-Vilant (TRELEGY ELLIPTA) 100-62.5-25 MCG/INH AEPB Inhale 1 puff into the lungs daily.  Marland Kitchen GARLIC OIL PO Take 1 pg/oz/day by mouth once.  . hydrochlorothiazide (HYDRODIURIL) 25 MG tablet TAKE 1 TABLET BY MOUTH EVERY DAY  . meclizine (ANTIVERT) 25 MG tablet Take 1 tablet (25 mg total) by mouth 3 (three) times daily as needed for dizziness.  . Milk Thistle 250 MG CAPS Take 1 capsule by mouth once.  . Misc Natural Products (DANDELION ROOT PO) Take 1 tablet by mouth once.  . montelukast (SINGULAIR) 10 MG tablet Take 1 tablet (10 mg total) by mouth at bedtime.  . nitroGLYCERIN (NITROSTAT) 0.4 MG SL tablet PLACE 1 TAB UNDER THE TONGUE EVERY 5 MINUTES AS NEEDED FOR CHEST PAIN. CALL 911 MAX 3 PER EPISODE  . OVER THE COUNTER MEDICATION Cream of tartar 3 times per  month  . TURMERIC PO Take 1 tablet by mouth once.  . [DISCONTINUED] VITAMIN E PO Take by mouth daily.  . [DISCONTINUED] ezetimibe (ZETIA) 10 MG tablet Take 1 tablet (10 mg total) by mouth daily. (Patient not taking: Reported on 11/27/2020)  . [DISCONTINUED] Fluticasone-Umeclidin-Vilant (TRELEGY ELLIPTA) 100-62.5-25 MCG/INH AEPB Trelegy Ellipta 100 mcg-62.5 mcg-25 mcg powder for inhalation  .  [DISCONTINUED] Wound Dressings (TRIPLE HELIX COLLAGEN) POWD Apply topically.   No facility-administered encounter medications on file as of 04/01/2021.    Allergies (verified) Penicillins, Statins, and Aspirin   History: Past Medical History:  Diagnosis Date  . Chronic kidney disease    stage 1  . Depression   . Heart attack ([place])    (x2) 1980s and 1990s  . Hepatitis C   . Hyperlipidemia   . Hypertension   . 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 Osteoporosis   . Sleep apnea    No CPAP  . Stroke [facility]) 2013   Past Surgical History:  Procedure Laterality Date  . ABDOMINAL HYSTERECTOMY    . COLON SURGERY    . COLONOSCOPY WITH PROPOFOL N/A 10/04/2018   Procedure: COLONOSCOPY WITH PROPOFOL;  Surgeon: Lucilla Lame, MD;  Location: [place];  Service: Endoscopy;  Laterality: N/A;  sleep apnea  . OVARIAN CYST REMOVAL    . POLYPECTOMY  10/04/2018   Procedure: POLYPECTOMY;  Surgeon: Lucilla Lame, MD;  Location: [place];  Service: Endoscopy;;  . TONSILLECTOMY     Family History  Problem Relation Age of Onset  . Hypertension Mother   . Hypertension Father   . Hypertension Sister   . Hypertension Brother    Social History   Socioeconomic History  . Marital status: Divorced    Spouse name: Not on file  . Number of children: Not on file  . Years of education: Not on file  . Highest education level: Not on file  Occupational History  . Not on file  Tobacco Use  . Smoking status: Former Smoker    Quit date: 1970    Years since quitting: 52.4  . Smokeless tobacco: Never Used  Vaping Use  . Vaping Use: Never used  Substance and Sexual Activity  . Alcohol use: No    Alcohol/week: 0.0 standard drinks    Comment: may have a drink 1x/mo  . Drug use: No  . Sexual activity: Not Currently  Other Topics Concern  . Not on file  Social History Narrative   Pt living with her sister while her home is being built   Social Determinants of Adult nurse Strain: Low Risk   . Difficulty of Paying Living Expenses: Not hard at all  Food Insecurity: No Food Insecurity  . Worried About Charity fundraiser in the Last Year: Never true  . Ran Out of Food in the Last Year: Never true  Transportation Needs: No Transportation Needs  . Lack of Transportation (Medical): No  . Lack of Transportation (Non-Medical): No  Physical Activity: Inactive  . Days of Exercise per Week: 0 days  . Minutes of Exercise per Session: 0 min  Stress: Stress Concern Present  . Feeling of Stress : Very much  Social Connections: Socially Isolated  . Frequency of Communication with Friends and Family: More than three times a week  . Frequency of Social Gatherings with Friends and Family: Once a week  . Attends Religious Services: Never  . Active Member of Clubs or Organizations: No  .  Attends Archivist Meetings: Never  . Marital Status: Divorced    Tobacco Counseling Counseling given: Not Answered   Clinical Intake:  Pre-visit preparation completed: Yes  Pain : No/denies pain     BMI - recorded: 31.25 Nutritional Status: BMI > 30  Obese Nutritional Risks: None Diabetes: No  How often do you need to have someone help you when you read instructions, pamphlets, or other written materials from your doctor or pharmacy?: 1 - Never    Interpreter Needed?: No  Information entered by :: Clemetine Marker LPN   Activities of Daily Living In your present state of health, do you have any difficulty performing the following activities: 04/01/2021 11/27/2020  Hearing? N N  Comment declines hearing aids -  Vision? Y N  Difficulty concentrating or making decisions? N N  Walking or climbing stairs? N N  Dressing or bathing? N N  Doing errands, shopping? N N  Preparing Food and eating ? N -  Using the Toilet? N -  In the past six months, have you accidently leaked urine? Y -  Comment wears pads for protection -  Do you have problems  with loss of bowel control? N -  Managing your Medications? N -  Managing your Finances? N -  Housekeeping or managing your Housekeeping? N -  Some recent data might be hidden    Patient Care Team: Delsa Grana, PA-C as PCP - General (Family Medicine) Kate Sable, MD as PCP - Cardiology (Cardiology) Anabel Bene, MD as Referring Physician (Neurology)  Indicate any recent Medical Services you may have received from other than [facility] providers in the past year (date may be approximate).     Assessment:   This is a routine wellness examination for [place].  Hearing/Vision screen  Hearing Screening   125Hz  250Hz  500Hz  1000Hz  2000Hz  3000Hz  4000Hz  6000Hz  8000Hz   Right ear:           Left ear:           Comments: Pt denies hearing difficulty  Vision Screening Comments: Past due for eye exam; referral sent to [facility] today   Dietary issues and exercise activities discussed: Current Exercise Habits: The patient does not participate in regular exercise at present, Exercise limited by: respiratory conditions(s)  Goals Addressed            This Visit's Progress   . Weight (lb) < 140 lb (63.5 kg)   160 lb (72.6 kg)    Pt states she would like to lose weight over the next year with healthy eating and physical activity       Depression Screen PHQ 2/9 Scores 04/01/2021 11/27/2020 08/27/2020 08/22/2019 12/18/2018 09/17/2018 03/24/2016  PHQ - 2 Score 3 0 4 0 3 4 2   PHQ- 9 Score 12 2 10  0 8 11 6     Fall Risk Fall Risk  04/01/2021 11/27/2020 08/27/2020 08/22/2019 12/18/2018  Falls in the past year? 0 0 0 0 0  Number falls in past yr: 0 0 0 0 0  Injury with Fall? 0 0 0 0 0  Risk Factor Category  - - - - -  Risk for fall due to : No Fall Risks - - - -  Follow up Falls prevention discussed - - - -    FALL RISK PREVENTION PERTAINING TO THE HOME:  Any stairs in or around the home? Yes  If so, are there any without handrails? No  Home free of loose throw rugs  in  walkways, pet beds, electrical cords, etc? Yes  Adequate lighting in your home to reduce risk of falls? Yes   ASSISTIVE DEVICES UTILIZED TO PREVENT FALLS:  Life alert? No  Use of a cane, walker or w/c? No  Grab bars in the bathroom? Yes  Shower chair or bench in shower? Yes  Elevated toilet seat or a handicapped toilet? No   TIMED UP AND GO:  Was the test performed? No . Telephonic visit.   Cognitive Function: Normal cognitive status assessed by direct observation by this Nurse Health Advisor. No abnormalities found.          Immunizations Immunization History  Administered Date(s) Administered  . Tdap 09/25/2018    TDAP status: Up to date  Flu Vaccine status: Declined, Education has been provided regarding the importance of this vaccine but patient still declined. Advised may receive this vaccine at local pharmacy or Health Dept. Aware to provide a copy of the vaccination record if obtained from local pharmacy or Health Dept. Verbalized acceptance and understanding.  Pneumococcal vaccine status: Declined,  Education has been provided regarding the importance of this vaccine but patient still declined. Advised may receive this vaccine at local pharmacy or Health Dept. Aware to provide a copy of the vaccination record if obtained from local pharmacy or Health Dept. Verbalized acceptance and understanding.   Covid-19 vaccine status: Declined, Education has been provided regarding the importance of this vaccine but patient still declined. Advised may receive this vaccine at local pharmacy or Health Dept.or vaccine clinic. Aware to provide a copy of the vaccination record if obtained from local pharmacy or Health Dept. Verbalized acceptance and understanding.  Qualifies for Shingles Vaccine? Yes   Zostavax completed No   Shingrix Completed?: No.    Education has been provided regarding the importance of this vaccine. Patient has been advised to call insurance company to determine out  of pocket expense if they have not yet received this vaccine. Advised may also receive vaccine at local pharmacy or Health Dept. Verbalized acceptance and understanding.  Screening Tests Health Maintenance  Topic Date Due  . MAMMOGRAM  Never done  . DEXA SCAN  Never done  . COVID-19 Vaccine (1) 04/17/2021 (Originally 12/30/1955)  . Zoster Vaccines- Shingrix (1 of 2) 07/02/2021 (Originally 12/30/1999)  . PNA vac Low Risk Adult (1 of 2 - PCV13) 04/01/2022 (Originally 12/30/2014)  . COLONOSCOPY (Pts 45-60yrs Insurance coverage will need to be confirmed)  10/05/2023  . TETANUS/TDAP  09/25/2028  . Hepatitis C Screening  Completed  . HPV VACCINES  Aged Out  . INFLUENZA VACCINE  Discontinued    Health Maintenance  Health Maintenance Due  Topic Date Due  . MAMMOGRAM  Never done  . DEXA SCAN  Never done    Colorectal cancer screening: Type of screening: Colonoscopy. Completed 10/04/18. Repeat every 5 years  Mammogram status: Ordered 08/27/20. Pt provided with contact info and advised to call to schedule appt.   Bone Density status: Ordered 08/27/20. Pt provided with contact info and advised to call to schedule appt.  Lung Cancer Screening: (Low Dose CT Chest recommended if Age 1-80 years, 30 pack-year currently smoking OR have quit w/in 15years.) does not qualify.   Additional Screening:  Hepatitis C Screening: does qualify; Completed 08/27/20  Vision Screening: Recommended annual ophthalmology exams for early detection of glaucoma and other disorders of the eye. Is the patient up to date with their annual eye exam?  No  Who is the provider or what  is the name of the office in which the patient attends annual eye exams? Not established If pt is not established with a provider, would they like to be referred to a provider to establish care? Yes .   Dental Screening: Recommended annual dental exams for proper oral hygiene  Community Resource Referral / Chronic Care Management: CRR  required this visit?  No   CCM required this visit?  No      Plan:     I have personally reviewed and noted the following in the patient's chart:   . Medical and social history . Use of alcohol, tobacco or illicit drugs  . Current medications and supplements including opioid prescriptions. Patient is not currently taking opioid prescriptions. . Functional ability and status . Nutritional status . Physical activity . Advanced directives . List of other physicians . Hospitalizations, surgeries, and ER visits in previous 12 months . Vitals . Screenings to include cognitive, depression, and falls . Referrals and appointments  In addition, I have reviewed and discussed with patient certain preventive protocols, quality metrics, and best practice recommendations. A written personalized care plan for preventive services as well as general preventive health recommendations were provided to patient.     Clemetine Marker, LPN   12/05/2351   Nurse Notes: pt states she passed 13 finger tip sized blood clots vaginally at the beginning of May and experienced cramping for approx 3 days. Pt is not currently established with OB-GYN and was advised to schedule appt wth PCP to discuss and for referral.   Pt is currently living with her sister and inquired about possibly getting portable oxygen due to breathing difficulty and her sister not wanting to use fans or lower air condition temp. Pt needs OV to discuss and due for labs.

## 2021-04-02 ENCOUNTER — Other Ambulatory Visit: Payer: Self-pay | Admitting: Family Medicine

## 2021-04-02 DIAGNOSIS — I252 Old myocardial infarction: Secondary | ICD-10-CM

## 2021-04-02 DIAGNOSIS — I10 Essential (primary) hypertension: Secondary | ICD-10-CM

## 2021-04-13 ENCOUNTER — Telehealth: Payer: Self-pay

## 2021-04-13 NOTE — Telephone Encounter (Signed)
Left vm pt needs appt

## 2021-04-13 NOTE — Telephone Encounter (Signed)
Copied from [place] [phone]. Topic: Referral - Request for Referral >> Apr 13, 2021  3:13 PM Tessa Lerner A wrote: Has patient seen PCP for this complaint? Yes.    *If NO, is insurance requiring patient see PCP for this issue before PCP can refer them?  Referral for which specialty: imaging  Preferred provider/office: Patient has no preference   Reason for referral: Patient would like to have a bone density scan as well as a mammogram

## 2021-04-13 NOTE — Telephone Encounter (Signed)
Copied from [place] [phone]. Topic: Referral - Request for Referral >> Apr 13, 2021  3:20 PM Tessa Lerner A wrote: Has patient seen PCP for this complaint? Yes.    *If NO, is insurance requiring patient see PCP for this issue before PCP can refer them?  Referral for which specialty: OBGYN  Preferred provider/office: Patient has no preference   Reason for referral: frequent personal discomfort and patient's need for a yearly exam

## 2021-04-13 NOTE — Telephone Encounter (Signed)
Copied from [place] [id number]. Topic: Referral - Request for Referral >> Apr 13, 2021  3:18 PM Tessa Lerner A wrote: Has patient seen PCP for this complaint? Yes.    *If NO, is insurance requiring patient see PCP for this issue before PCP can refer them?  Referral for which specialty: ophthalmology   Preferred provider/office: Patient has no preference  Reason for referral: Patient has additional concerns related to their eyes

## 2021-04-13 NOTE — Telephone Encounter (Signed)
Copied from [place] [phone]. Topic: General - Other >> Apr 13, 2021  3:23 PM Tessa Lerner A wrote: Reason for CRM: Patient has made contact requesting orders to have labwork done   Patient shares that they have concerns related to previous exposure Hepatitis C  Please contact to advise further if needed

## 2021-04-13 NOTE — Telephone Encounter (Signed)
Left vm orders in needs to call to setup, # given

## 2021-04-20 ENCOUNTER — Telehealth: Payer: Self-pay

## 2021-04-20 NOTE — Telephone Encounter (Signed)
Lvm for pt to call and schedule an appt and to also inform her that her mammogram order and dexa have been sent in last oct and she can call and schedule those  Copied from [place] [phone]. Topic: General - Other >> Apr 19, 2021  5:22 PM Tricia Vasquez wrote: Reason for CRM: Pt called for an update on the request for bone density testing, complete panel of blood work, mammogram as well as the referral requests for an eye doctor, OBGYN, and ENT.

## 2021-04-20 NOTE — Telephone Encounter (Signed)
Staff can handle these repeated requests -  Pt has been asked and instructed to come in for f/up - she hasn't come - multiple times She has called with requests for referrals and orders and work up on new sx and concerns and she has been told to come in for OV, she hasnt' come and referrals aren't appropriate Screening tests for mammo and dexa have been ordered as of oct 2021 and pt has been told this and given # to call and schedule them for herself - per past notes 6/14 phone call and jamie documentation, past OV and wellness visit - and she is calling with same questions  Labs, panels, screening, referrals all need in office visits for assessment before any orders can be put in.   Please handle or ask CMAs to review chart and prior calls/requests/orders/instructions to help handle

## 2021-05-06 ENCOUNTER — Other Ambulatory Visit: Payer: Self-pay | Admitting: Family Medicine

## 2021-05-06 DIAGNOSIS — I10 Essential (primary) hypertension: Secondary | ICD-10-CM

## 2021-05-06 DIAGNOSIS — I252 Old myocardial infarction: Secondary | ICD-10-CM

## 2021-05-17 ENCOUNTER — Ambulatory Visit: Payer: Medicare Other | Admitting: Family Medicine

## 2021-05-21 ENCOUNTER — Ambulatory Visit: Payer: Medicare Other | Admitting: Family Medicine

## 2021-05-31 ENCOUNTER — Other Ambulatory Visit: Payer: Self-pay

## 2021-05-31 ENCOUNTER — Ambulatory Visit (INDEPENDENT_AMBULATORY_CARE_PROVIDER_SITE_OTHER): Payer: Medicare Other | Admitting: Family Medicine

## 2021-05-31 ENCOUNTER — Encounter: Payer: Self-pay | Admitting: Family Medicine

## 2021-05-31 VITALS — BP 122/78 | HR 66 | Temp 97.7°F | Resp 16 | Ht 60.0 in | Wt 164.1 lb

## 2021-05-31 DIAGNOSIS — Z5181 Encounter for therapeutic drug level monitoring: Secondary | ICD-10-CM

## 2021-05-31 DIAGNOSIS — J4530 Mild persistent asthma, uncomplicated: Secondary | ICD-10-CM | POA: Diagnosis not present

## 2021-05-31 DIAGNOSIS — H539 Unspecified visual disturbance: Secondary | ICD-10-CM

## 2021-05-31 DIAGNOSIS — I252 Old myocardial infarction: Secondary | ICD-10-CM

## 2021-05-31 DIAGNOSIS — R519 Headache, unspecified: Secondary | ICD-10-CM

## 2021-05-31 DIAGNOSIS — E785 Hyperlipidemia, unspecified: Secondary | ICD-10-CM | POA: Diagnosis not present

## 2021-05-31 DIAGNOSIS — I2510 Atherosclerotic heart disease of native coronary artery without angina pectoris: Secondary | ICD-10-CM | POA: Diagnosis not present

## 2021-05-31 DIAGNOSIS — F320 Major depressive disorder, single episode, mild: Secondary | ICD-10-CM

## 2021-05-31 DIAGNOSIS — I10 Essential (primary) hypertension: Secondary | ICD-10-CM | POA: Diagnosis not present

## 2021-05-31 DIAGNOSIS — B182 Chronic viral hepatitis C: Secondary | ICD-10-CM

## 2021-05-31 DIAGNOSIS — Z87891 Personal history of nicotine dependence: Secondary | ICD-10-CM

## 2021-05-31 DIAGNOSIS — H8103 Meniere's disease, bilateral: Secondary | ICD-10-CM

## 2021-05-31 DIAGNOSIS — R42 Dizziness and giddiness: Secondary | ICD-10-CM

## 2021-05-31 DIAGNOSIS — N939 Abnormal uterine and vaginal bleeding, unspecified: Secondary | ICD-10-CM

## 2021-05-31 DIAGNOSIS — I639 Cerebral infarction, unspecified: Secondary | ICD-10-CM

## 2021-05-31 LAB — CBC WITH DIFFERENTIAL/PLATELET
Absolute Monocytes: 617 cells/uL (ref 200–950)
Basophils Absolute: 38 cells/uL (ref 0–200)
Basophils Relative: 0.6 %
Eosinophils Absolute: 132 cells/uL (ref 15–500)
Eosinophils Relative: 2.1 %
HCT: 39.4 % (ref 35.0–45.0)
Hemoglobin: 13.1 g/dL (ref 11.7–15.5)
Lymphs Abs: 2111 cells/uL (ref 850–3900)
MCH: 30.2 pg (ref 27.0–33.0)
MCHC: 33.2 g/dL (ref 32.0–36.0)
MCV: 90.8 fL (ref 80.0–100.0)
MPV: 11.8 fL (ref 7.5–12.5)
Monocytes Relative: 9.8 %
Neutro Abs: 3402 cells/uL (ref 1500–7800)
Neutrophils Relative %: 54 %
Platelets: 228 10*3/uL (ref 140–400)
RBC: 4.34 10*6/uL (ref 3.80–5.10)
RDW: 13.8 % (ref 11.0–15.0)
Total Lymphocyte: 33.5 %
WBC: 6.3 10*3/uL (ref 3.8–10.8)

## 2021-05-31 LAB — COMPLETE METABOLIC PANEL WITH GFR
AG Ratio: 2.1 (calc) (ref 1.0–2.5)
ALT: 36 U/L — ABNORMAL HIGH (ref 6–29)
AST: 24 U/L (ref 10–35)
Albumin: 4.5 g/dL (ref 3.6–5.1)
Alkaline phosphatase (APISO): 77 U/L (ref 37–153)
BUN/Creatinine Ratio: 22 (calc) (ref 6–22)
BUN: 29 mg/dL — ABNORMAL HIGH (ref 7–25)
CO2: 26 mmol/L (ref 20–32)
Calcium: 10.2 mg/dL (ref 8.6–10.4)
Chloride: 104 mmol/L (ref 98–110)
Creat: 1.29 mg/dL — ABNORMAL HIGH (ref 0.60–1.00)
Globulin: 2.1 g/dL (calc) (ref 1.9–3.7)
Glucose, Bld: 98 mg/dL (ref 65–99)
Potassium: 4.7 mmol/L (ref 3.5–5.3)
Sodium: 140 mmol/L (ref 135–146)
Total Bilirubin: 0.4 mg/dL (ref 0.2–1.2)
Total Protein: 6.6 g/dL (ref 6.1–8.1)
eGFR: 44 mL/min/{1.73_m2} — ABNORMAL LOW (ref >=60)

## 2021-05-31 LAB — LIPID PANEL
Cholesterol: 237 mg/dL — ABNORMAL HIGH (ref <200)
HDL: 54 mg/dL (ref >=50)
LDL Cholesterol (Calc): 157 mg/dL (calc) — ABNORMAL HIGH
Non-HDL Cholesterol (Calc): 183 mg/dL (calc) — ABNORMAL HIGH (ref <130)
Total CHOL/HDL Ratio: 4.4 (calc) (ref <5.0)
Triglycerides: 132 mg/dL (ref <150)

## 2021-05-31 MED ORDER — EZETIMIBE 10 MG PO TABS: 10.0000 mg | ORAL_TABLET | Freq: Every day | ORAL | 3 refills | Status: DC

## 2021-05-31 MED ORDER — MECLIZINE HCL 25 MG PO TABS: 25.0000 mg | ORAL_TABLET | Freq: Three times a day (TID) | ORAL | 5 refills | Status: DC | PRN

## 2021-05-31 NOTE — Progress Notes (Signed)
Name: Tricia Vasquez   MRN: [id number]    DOB: 09-21-1951   Date:05/31/2021       Progress Note  Chief Complaint  Patient presents with   Referrals     Subjective:   Tricia Vasquez is a 70 y.o. female, presents to clinic for discussion about referrals She was previously referred to cardiology and neurology due to few OV, no labs for over 1.5 years when I first say her, not on appropriate management, not est with specialists in the area and hx of CVA and MI  Hx of CVA, MI, hep C, statin intolerant, on atenolol and HCTZ for HTN   She consulted with cardiology 08/2020 with Dr. Garen Lah:  Tricia Vasquez is a 70 y.o. 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  Patient states having at least 3 MIs in the 61s or 90s.  No intervention such as balloon angioplasty or stenting was done.  She was in [place] at the time.  Since then, she has not followed up with cardiology March.  Denies any chest pain or shortness of breath at rest or with exertion.  She has tried multiple statins in the past but could not tolerate due to muscle aches, overall malaise.  She also has allergies to aspirin.  She has been compliant with her blood pressure regimen including atenolol and HCTZ for years now.   Echo 07/2014 showed normal systolic function, normal diastolic function, EF 60 to 65%   Repeat ECHO done 10/06/2020: reviewed results Normal systolic function, mild MR.  No gross abnormalities noted. Echocardiogram appears stable compared to prior from 2015  IMPRESSIONS     1. Left ventricular ejection fraction, by estimation, is 55 to 60%. The  left ventricle has normal function. Left ventricular endocardial border  not optimally defined to evaluate regional wall motion. Left ventricular  diastolic parameters are consistent  with Grade II diastolic dysfunction (pseudonormalization). Elevated left  atrial pressure.   2. Right  ventricular systolic function is normal. The right ventricular  size is normal. There is mildly elevated pulmonary artery systolic  pressure.   3. Left atrial size was mild to moderately dilated.   4. The mitral valve is normal in structure. Mild mitral valve  regurgitation. No evidence of mitral stenosis.   5. The aortic valve is tricuspid. Aortic valve regurgitation is not  visualized. No aortic stenosis is present.   6. The inferior vena cava is normal in size with <50% respiratory  variability, suggesting right atrial pressure of 8 mmHg.    Cardiology A&P 08/2020: In order of problems listed above:   Patient states having history of MI in the 80s and 90s.  Not tolerant to aspirin or statins.  PCSK9 inhibitor was offered to patient but she states not liking needles.  We will start Zetia 10 mg daily.  Get echocardiogram.  Currently has no symptoms of chest pain or shortness of breath. Hypertension, blood pressure controlled.  Continue HCTZ and atenolol. History of hyperlipidemia, LDL not at goal, Zetia as above.   Follow up after echocardiogram.  Hypertension: Currently managed on HCTZ and atenolol  Pt reports good med compliance and denies any SE.   Blood pressure today is well controlled. BP Readings from Last 3 Encounters:  05/31/21 122/78  04/01/21 132/72  09/10/20 120/70  Pt denies CP, SOB, exertional sx, LE edema, palpitation, Ha's, visual disturbances, lightheadedness, hypotension, syncope.    HLD- statin intolerant, not on  meds  Cardiology recommended PCSK9, but pt refused due to not wanting to do shots, recommended zetia - of note prior PCP had rx'd zetia as well Hx of CVA or tia? and MI- multiple in 80's 90's  - ASA allergy Lab Results  Component Value Date   CHOL 248 (H) 08/27/2020   HDL 41 (L) 08/27/2020   LDLCALC 156 (H) 08/27/2020   TRIG 334 (H) 08/27/2020   CHOLHDL 6.0 (H) 08/27/2020      MDD:  Pt has been managing depression/mood with fluoxetine 30 mg  daily for several years   PHQ reviewed, score positive, worse than prior OV- she states cause she is now living in a toxic environment for her Depression screen [facility] 2/9 05/31/2021 04/01/2021 11/27/2020  Decreased Interest 2 0 0  Down, Depressed, Hopeless 3 3 0  PHQ - 2 Score 5 3 0  Altered sleeping 0 3 1  Tired, decreased energy '3 3 1  '$ Change in appetite 2 0 0  Feeling bad or failure about yourself  1 0 0  Trouble concentrating 0 0 0  Moving slowly or fidgety/restless 1 3 0  Suicidal thoughts 0 0 0  PHQ-9 Score '12 12 2  '$ Difficult doing work/chores Not difficult at all Somewhat difficult Not difficult at all   Asthma - singulair, allergy meds, stopped trelegy 2 months ago, using albuterol about 3 months ago.  Vertigo attacks started in May getting worse - in bed for 2 months, episodes daily  Sitting still with dizziness episodes for hours associated loss of balance, nausea, HA, visual disturbances - blurry episodes and watery eyes Wants ENT visit for menieres Wants EYE referral for vision      Current Outpatient Medications:    albuterol (VENTOLIN HFA) 108 (90 Base) MCG/ACT inhaler, Inhale 2 puffs into the lungs every 4 (four) hours as needed for wheezing or shortness of breath., Disp: 8 g, Rfl: 11   aspirin EC 81 MG tablet, Take 81 mg by mouth 2 (two) times daily., Disp: , Rfl:    atenolol (TENORMIN) 50 MG tablet, TAKE 1 TABLET BY MOUTH EVERY DAY, Disp: 90 tablet, Rfl: 3   BLACK WALNUT POLLEN [place], Inject 1 tablet into the skin once., Disp: , Rfl:    Cholecalciferol (VITAMIN D) 2000 units tablet, Take 2,000 Units by mouth daily., Disp: , Rfl:    Cyanocobalamin (VITAMIN B-12) 5000 MCG SUBL, Place 1 tablet under the tongue 3 (three) times a week., Disp: , Rfl:    fexofenadine (ALLEGRA) 180 MG tablet, Take 1 tablet (180 mg total) by mouth daily., Disp: 90 tablet, Rfl: 3   FLUoxetine (PROZAC) 20 MG tablet, Take 1 tablet (20 mg total) by mouth daily., Disp: 90 tablet, Rfl: 3   fluticasone  (FLONASE) 50 MCG/ACT nasal spray, USE 2 SPRAYS DAILY PER NOSTRIL, Disp: 16 g, Rfl: 11   Fluticasone-Umeclidin-Vilant (TRELEGY ELLIPTA) 100-62.5-25 MCG/INH AEPB, Inhale 1 puff into the lungs daily., Disp: 28 each, Rfl: 11   GARLIC OIL PO, Take 1 pg/oz/day by mouth once., Disp: , Rfl:    hydrochlorothiazide (HYDRODIURIL) 25 MG tablet, TAKE 1 TABLET BY MOUTH EVERY DAY, Disp: 90 tablet, Rfl: 3   meclizine (ANTIVERT) 25 MG tablet, Take 1 tablet (25 mg total) by mouth 3 (three) times daily as needed for dizziness., Disp: 60 tablet, Rfl: 5   Milk Thistle 250 MG CAPS, Take 1 capsule by mouth once., Disp: , Rfl:    Misc Natural Products (DANDELION ROOT PO), Take 1 tablet by mouth  once., Disp: , Rfl:    montelukast (SINGULAIR) 10 MG tablet, Take 1 tablet (10 mg total) by mouth at bedtime., Disp: 30 tablet, Rfl: 11   nitroGLYCERIN (NITROSTAT) 0.4 MG SL tablet, PLACE 1 TAB UNDER THE TONGUE EVERY 5 MINUTES AS NEEDED FOR CHEST PAIN. CALL 911 MAX 3 PER EPISODE, Disp: 25 tablet, Rfl: 0   OVER THE COUNTER MEDICATION, Cream of tartar 3 times per month, Disp: , Rfl:    TURMERIC PO, Take 1 tablet by mouth once., Disp: , Rfl:   Patient Active Problem List   Diagnosis Date Noted   Coronary artery disease involving native heart 05/31/2021   Current mild episode of major depressive disorder, unspecified whether recurrent ([place]) 05/31/2021   Special screening for malignant neoplasms, colon    Benign neoplasm of transverse colon    Benign neoplasm of ascending colon    Cervical radiculopathy 12/22/2017   GAD (generalized anxiety disorder) 09/30/2016   Moderate persistent asthma without complication 99991111   Apnea, sleep 04/11/2016   Osteoporosis, 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   Meniere's disease 04/11/2016   Decreased potassium in the blood 04/11/2016   Hypertension 04/11/2016   Hyperlipidemia 04/11/2016   Chronic hepatitis C without hepatic coma ([place]) 04/11/2016   Depression 04/11/2016   Asthma without status  asthmaticus 04/11/2016   Chronic kidney disease (CKD), stage I 03/24/2016   Hx of myocardial infarction 03/24/2016   Hx of hepatitis C 03/24/2016   Hx of completed stroke 03/24/2016   Preventative health care 03/24/2016   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   Cerebrovascular accident (CVA) ([place]) 08/06/2014   Headache disorder 08/06/2014    Past Surgical History:  Procedure Laterality Date   ABDOMINAL HYSTERECTOMY     COLON SURGERY     COLONOSCOPY WITH PROPOFOL N/A 10/04/2018   Procedure: COLONOSCOPY WITH PROPOFOL;  Surgeon: Lucilla Lame, MD;  Location: [place];  Service: Endoscopy;  Laterality: N/A;  sleep apnea   OVARIAN CYST REMOVAL     POLYPECTOMY  10/04/2018   Procedure: POLYPECTOMY;  Surgeon: Lucilla Lame, MD;  Location: [place];  Service: Endoscopy;;   TONSILLECTOMY      Family History  Problem Relation Age of Onset   Hypertension Mother    Hypertension Father    Hypertension Sister    Hypertension Brother     Social History   Tobacco Use   Smoking status: Former    Types: Cigarettes    Quit date: 1970    Years since quitting: 52.6   Smokeless tobacco: Never  Vaping Use   Vaping Use: Never used  Substance Use Topics   Alcohol use: No    Alcohol/week: 0.0 standard drinks    Comment: may have a drink 1x/mo   Drug use: No     Allergies  Allergen Reactions   Penicillins Swelling and Rash        Statins Shortness Of Breath    And chest pain   Aspirin Other (See Comments)    Tongue swelling    Health Maintenance  Topic Date Due   MAMMOGRAM  Never done   DEXA SCAN  Never done   COVID-19 Vaccine (1) 06/16/2021 (Originally 12/30/1955)   Zoster Vaccines- Shingrix (1 of 2) 07/02/2021 (Originally 12/30/1999)   PNA vac Low Risk Adult (1 of 2 - PCV13) 04/01/2022 (Originally 12/30/2014)   COLONOSCOPY (Pts 45-40yr Insurance coverage will need to be confirmed)  10/05/2023   TETANUS/TDAP  09/25/2028   Hepatitis C Screening  Completed  HPV VACCINES  Aged Out   INFLUENZA VACCINE  Discontinued    Chart Review Today: I personally reviewed active problem list, medication list, allergies, family history, social history, health maintenance, notes from last encounter, lab results, imaging with the patient/caregiver today.   Review of Systems  Constitutional: Negative.   HENT: Negative.    Eyes: Negative.   Respiratory: Negative.    Cardiovascular: Negative.   Gastrointestinal: Negative.   Endocrine: Negative.   Genitourinary: Negative.   Musculoskeletal: Negative.   Skin: Negative.   Allergic/Immunologic: Negative.   Neurological: Negative.   Hematological: Negative.   Psychiatric/Behavioral: Negative.    All other systems reviewed and are negative.   Objective:   Vitals:   05/31/21 1340  BP: 122/78  Pulse: 66  Resp: 16  Temp: 97.7 F (36.5 C)  SpO2: 98%  Weight: 164 lb 1.6 oz (74.4 kg)  Height: 5' (1.524 m)    Body mass index is 32.05 kg/m.  Physical Exam Vitals and nursing note reviewed.  Constitutional:      General: She is not in acute distress.    Appearance: Normal appearance. She is obese. She is not ill-appearing, toxic-appearing or diaphoretic.  HENT:     Head: Normocephalic and atraumatic.     Right Ear: External ear normal.     Left Ear: External ear normal.     Nose: Nose normal. No congestion.     Mouth/Throat:     Mouth: Mucous membranes are moist.     Pharynx: Oropharynx is clear. No oropharyngeal exudate or posterior oropharyngeal erythema.  Eyes:     General: No visual field deficit or scleral icterus.       Right eye: No discharge.        Left eye: No discharge.     Extraocular Movements: Extraocular movements intact.     Conjunctiva/sclera: Conjunctivae normal.     Pupils: Pupils are equal, round, and reactive to light.  Cardiovascular:     Rate and Rhythm: Normal rate and regular rhythm.     Pulses: Normal pulses.     Heart sounds: Normal heart sounds.  Pulmonary:      Effort: Pulmonary effort is normal. No respiratory distress.     Breath sounds: Normal breath sounds. No stridor. No wheezing, rhonchi or rales.  Abdominal:     General: Bowel sounds are normal.     Palpations: Abdomen is soft.  Musculoskeletal:     Cervical back: Normal range of motion and neck supple.     Right lower leg: No edema.     Left lower leg: No edema.  Skin:    Capillary Refill: Capillary refill takes less than 2 seconds.     Coloration: Skin is not jaundiced or pale.  Neurological:     Mental Status: She is alert.     Cranial Nerves: Cranial nerves are intact. No cranial nerve deficit or facial asymmetry.     Sensory: Sensation is intact.     Gait: Gait normal.     Comments: MENTAL STATUS: AAOx3, memory intact, fund of knowledge appropriate  LANG/SPEECH: Naming and repetition intact, fluent, no dysarthria, follows 3-step commands, answers questions appropriately   CRANIAL NERVES:   II: Pupils equal and reactive, no RAPD   III, IV, VI: EOM intact, no gaze preference or deviation, no nystagmus.   V: normal sensation in V1, V2, and V3 segments bilaterally   VII: no asymmetry, no nasolabial fold flattening   VIII: normal hearing to speech  IX, X: normal palatal elevation, no uvular deviation   XI: 5/5 head turn and 5/5 shoulder shrug bilaterally   XII: midline tongue protrusion  MOTOR:  5/5 bilateral grip strength 5/5 strength dorsiflexion/plantarflexion b/l  SENSORY:  Normal to light touch Romberg absent  COORD: Normal finger to nose and heel to shin, no tremor, no dysmetria    Psychiatric:        Behavior: Behavior normal.        Assessment & Plan:     ICD-10-CM   1. Blood pressure hypertension, unspecified type  99991111 COMPLETE METABOLIC PANEL WITH GFR   Well-controlled and stable today, BP at goal, only on atenolol    2. Hyperlipidemia, unspecified hyperlipidemia type  99991111 COMPLETE METABOLIC PANEL WITH GFR    Lipid panel    ezetimibe (ZETIA) 10  MG tablet   Prior statin intolerance, cardiology prescribed Zetia but pt not taking, may be eligible for Praluent or Repatha, high LDL, high risk, MI/CVS hx    3. Mild persistent asthma without status asthmaticus without complication  [id number]    On Trelegy and albuterol as needed    4. Coronary artery disease involving native heart, unspecified vessel or lesion type, unspecified whether angina present  I25.10 Lipid panel   Per cardiology, currently no exertional chest pain symptoms    5. Hx of myocardial infarction  I25.2 Lipid panel   Needs follow-up with cardiology    6. Former smoker  Z87.891     77. Current mild episode of major depressive disorder, unspecified whether recurrent (HCC)  F32.0    On fluoxetine    8. Chronic hepatitis C without hepatic coma (HCC) Chronic [id number] COMPLETE METABOLIC PANEL WITH GFR   Last viral load was undetectable per chart review in 2018 documented by Dr. Sanda Klein, have been monitoring LFTs    9. Meniere's disease of both ears  H81.03 Ambulatory referral to ENT    meclizine (ANTIVERT) 25 MG tablet   worse for the past 3 months     10. Cerebrovascular accident (CVA), unspecified mechanism ([place])  I63.9    She reports 3 strokes more than 20 to 30 years ago not on statins or aspirin poor patient compliance and control of chronic conditions    11. Headache disorder  R51.9    poor historian, poor compliance and f/up, may need neuro - complex hx and multiple comorbidities, no focal neuro deficit though eval difficult with menieires     12. Encounter for medication monitoring  Z51.81 CBC with Differential/Platelet    COMPLETE METABOLIC PANEL WITH GFR    Lipid panel    13. Vision changes  H53.9 Ambulatory referral to Ophthalmology   Encourage patient to reach out to insurance and see if she needs referral or can schedule herself with optometry for vision screening    14. Chronic vertigo  R42 meclizine (ANTIVERT) 25 MG tablet   refill on meds, difficult  assessment with worsening recent vertigo and history of Mnire's    15. Meniere's disease of both ears  H81.03 Ambulatory referral to ENT    meclizine (ANTIVERT) 25 MG tablet   Patient request ENT consult for further evaluation, episodes became more severe in the last 2 months, if no concerns with ENT may need neuro work-up    16. Abnormal vaginal bleeding  N93.9 Ambulatory referral to Gynecology   Patient briefly mentions at the end of the visit that several months ago she had abnormal spotting for several days, none currently  Complex presentation - reviewed strokelike sx and red flags related to vertigo symptoms and headache symptoms that warrant ER visit or calling 911 for immediate evaluation patient verbalized understanding  Extensive chart review done with patient today due to multiple conditions, poor follow-up, old specialist visits and prior diagnoses labs and concerns which she has not come in for regularly.  She has been out of town and doing virtual visits for most of the last 2 years She has poor follow-up and poor compliance with multiple poorly controlled comorbidities and concerning history of multiple strokes and heart attacks in the past not on standard of care meds  Additionally spent more than 40 minutes with the patient in the room getting history and thorough exam today additional complaints including but not limited to last-minute endorsement of vaginal bleeding and wanting further referrals And total only with chart review, chart documentation, orders, time spent with the patient doing exam and history spent well over 60 minutes with her same-day appointment   Additional time was spent afterwards completing more chart review and coronation of care  Return for Medicare wellness w/ kasey.   Delsa Grana, PA-C 05/31/21 2:01 PM

## 2021-06-01 ENCOUNTER — Telehealth: Payer: Self-pay

## 2021-06-01 NOTE — Telephone Encounter (Signed)
[facility] referring for Abnormal vaginal bleeding. Called and left voicemail for patient to call back to be scheduled.

## 2021-06-02 ENCOUNTER — Other Ambulatory Visit: Payer: Self-pay

## 2021-06-02 DIAGNOSIS — N179 Acute kidney failure, unspecified: Secondary | ICD-10-CM

## 2021-06-02 DIAGNOSIS — N1831 Chronic kidney disease, stage 3a: Secondary | ICD-10-CM

## 2021-06-02 NOTE — Telephone Encounter (Signed)
Called and left voicemail for patient to call back to be scheduled. 

## 2021-06-14 ENCOUNTER — Ambulatory Visit (INDEPENDENT_AMBULATORY_CARE_PROVIDER_SITE_OTHER): Payer: Medicare Other | Admitting: Obstetrics & Gynecology

## 2021-06-14 ENCOUNTER — Encounter: Payer: Self-pay | Admitting: Obstetrics & Gynecology

## 2021-06-14 ENCOUNTER — Other Ambulatory Visit: Payer: Self-pay

## 2021-06-14 VITALS — BP 120/80 | Ht 60.0 in | Wt 161.0 lb

## 2021-06-14 DIAGNOSIS — I639 Cerebral infarction, unspecified: Secondary | ICD-10-CM | POA: Diagnosis not present

## 2021-06-14 DIAGNOSIS — Z90710 Acquired absence of both cervix and uterus: Secondary | ICD-10-CM

## 2021-06-14 DIAGNOSIS — N950 Postmenopausal bleeding: Secondary | ICD-10-CM | POA: Diagnosis not present

## 2021-06-14 NOTE — Progress Notes (Signed)
Postmenopausal Bleeding Patient is a 70 yo WF who complains of vaginal bleeding. She has been menopausal for several  decades, and has had a prior hysterectomy in the 1990s (fibroids and bleeding then) . Currently on no HRT and no blood thinners; also is not sexually active and reports no recent vaginal trauma or infection. Bleeding is described as spotting and has occurred 2 times. Other menopausal symptoms include: none. Workup to date: none.  She has had some mild lower abdominal pain.  No other associated symptoms.  No modifiers.  PMHx: She  has a past medical history of Chronic kidney disease, Depression, Heart attack ([place]), Hepatitis C, Hyperlipidemia, Hypertension, Meniere's disease of both ears (03/24/2016), Osteoporosis, Sleep apnea, and Stroke ([place]) (2013). Also,  has a past surgical history that includes Tonsillectomy; Abdominal hysterectomy; Ovarian cyst removal; Colon surgery; Colonoscopy with propofol (N/A, 10/04/2018); and polypectomy (10/04/2018)., family history includes Hypertension in her brother, father, mother, and sister.,  reports that she quit smoking about 52 years ago. Her smoking use included cigarettes. She has never used smokeless tobacco. She reports that she does not drink alcohol and does not use drugs.  She has a current medication list which includes the following prescription(s): albuterol, aspirin ec, atenolol, black walnut pollen, vitamin d, vitamin b-12, ezetimibe, fexofenadine, fluoxetine, fluticasone, trelegy ellipta, garlic, hydrochlorothiazide, meclizine, milk thistle, dandelion, montelukast, nitroglycerin, OVER THE COUNTER MEDICATION, and turmeric. Also, is allergic to penicillins, statins, and aspirin.  Review of Systems  Constitutional:  Negative for chills, fever and malaise/fatigue.  HENT:  Negative for congestion, sinus pain and sore throat.   Eyes:  Negative for blurred vision and pain.  Respiratory:  Negative for cough and wheezing.   Cardiovascular:   Negative for chest pain and leg swelling.  Gastrointestinal:  Positive for abdominal pain. Negative for constipation, diarrhea, heartburn, nausea and vomiting.  Genitourinary:  Positive for frequency. Negative for dysuria, hematuria and urgency.  Musculoskeletal:  Negative for back pain, joint pain, myalgias and neck pain.  Skin:  Negative for itching and rash.  Neurological:  Negative for dizziness, tremors and weakness.  Endo/Heme/Allergies:  Does not bruise/bleed easily.  Psychiatric/Behavioral:  Negative for depression. The patient is not nervous/anxious and does not have insomnia.    Objective: BP 120/80   Ht 5' (1.524 m)   Wt 161 lb (73 kg)   BMI 31.44 kg/m  Physical Exam Constitutional:      General: She is not in acute distress.    Appearance: She is well-developed.  Genitourinary:     Bladder, rectum and urethral meatus normal.     Genitourinary Comments: Cuff intact/ no lesions  Absent uterus and cervix     Right Labia: No rash, lesions or skin changes.    Left Labia: No lesions, skin changes or rash.    Vaginal cuff intact.    No vaginal erythema or bleeding.     No vaginal prolapse present.    Mild vaginal atrophy present.    Cervix is absent.     Uterus is absent.     Bladder exam comments: No cystocele or rectocele.     Pelvic exam was performed with patient in the lithotomy position.  HENT:     Head: Normocephalic and atraumatic.     Nose: Nose normal.  Abdominal:     General: There is no distension.     Palpations: Abdomen is soft.     Tenderness: There is no abdominal tenderness.  Musculoskeletal:  General: Normal range of motion.  Neurological:     Mental Status: She is alert and oriented to person, place, and time.     Cranial Nerves: No cranial nerve deficit.  Skin:    General: Skin is warm and dry.  Psychiatric:        Attention and Perception: Attention normal.        Mood and Affect: Mood normal.        Speech: Speech normal.         Behavior: Behavior normal.        Cognition and Memory: Cognition normal.        Judgment: Judgment normal.    ASSESSMENT/PLAN:   Problem List Items Addressed This Visit     S/P abdominal hysterectomy    Post-menopausal bleeding    -  Primary/new onset No evidence for bleeding today Low risk for cancer as she has no cervix or uterus Could be alternative source, such as hemorrhoids No laceration, ulceration, dehiscence, urethral anomaly  Monitor for now Return if becomes recurrent   Barnett Applebaum, MD, [place], [facility] 06/14/2021  1:32 PM

## 2021-09-05 ENCOUNTER — Other Ambulatory Visit: Payer: Self-pay | Admitting: Family Medicine

## 2021-09-05 DIAGNOSIS — F439 Reaction to severe stress, unspecified: Secondary | ICD-10-CM

## 2021-09-05 DIAGNOSIS — J302 Other seasonal allergic rhinitis: Secondary | ICD-10-CM

## 2021-09-05 DIAGNOSIS — F320 Major depressive disorder, single episode, mild: Secondary | ICD-10-CM

## 2021-09-05 NOTE — Telephone Encounter (Signed)
Requested Prescriptions  Pending Prescriptions Disp Refills  . FLUoxetine (PROZAC) 20 MG tablet [Pharmacy Med Name: FLUOXETINE HCL 20 MG TABLET] 90 tablet 0    Sig: TAKE 1 TABLET BY MOUTH EVERY DAY     Psychiatry:  Antidepressants - SSRI Passed - 09/05/2021 11:04 AM      Passed - Completed PHQ-2 or PHQ-9 in the last 360 days      Passed - Valid encounter within last 6 months    Recent Outpatient Visits          3 months ago Blood pressure hypertension, unspecified type   [facility] Delsa Grana, PA-C   9 months ago Hypertension, unspecified type   [facility] Delsa Grana, PA-C   1 year ago Current mild episode of major depressive disorder, unspecified whether recurrent [facility])   [facility] Delsa Grana, PA-C   2 years ago Essential hypertension   [facility] Delsa Grana, PA-C   2 years ago Essential hypertension   [place], Satira Anis, MD      Future Appointments            In 7 months [facility] [place]            . fluticasone (FLONASE) 50 MCG/ACT nasal spray [Pharmacy Med Name: FLUTICASONE PROP 50 MCG SPRAY] 48 mL 3    Sig: USE 2 SPRAYS IN EACH NOSTRIL ONCE DAILY     Ear, Nose, and Throat: Nasal Preparations - Corticosteroids Passed - 09/05/2021 11:04 AM      Passed - Valid encounter within last 12 months    Recent Outpatient Visits          3 months ago Blood pressure hypertension, unspecified type   [facility] Delsa Grana, PA-C   9 months ago Hypertension, unspecified type   [facility] Delsa Grana, PA-C   1 year ago Current mild episode of major depressive disorder, unspecified whether recurrent [facility])   [facility] Delsa Grana, PA-C   2 years ago Essential hypertension   [facility] Delsa Grana, PA-C   2 years ago Essential hypertension   [place], Satira Anis, MD      Future Appointments            In 7 months [facility] [place]            . fexofenadine (ALLEGRA) 180 MG tablet [Pharmacy Med Name: FEXOFENADINE HCL 180 MG TABLET] 90 tablet 3    Sig: TAKE 1 TABLET BY MOUTH EVERY DAY     Ear, Nose, and Throat:  Antihistamines Passed - 09/05/2021 11:04 AM      Passed - Valid encounter within last 12 months    Recent Outpatient Visits          3 months ago Blood pressure hypertension, unspecified type   [facility] Delsa Grana, PA-C   9 months ago Hypertension, unspecified type   [facility] Delsa Grana, PA-C   1 year ago Current mild episode of major depressive disorder, unspecified whether recurrent [facility])   [facility] Delsa Grana, PA-C   2 years ago Essential hypertension   [facility] Delsa Grana, PA-C   2 years ago Essential hypertension   [place], Satira Anis, MD      Future Appointments  In 7 months [place]

## 2021-10-02 ENCOUNTER — Other Ambulatory Visit: Payer: Self-pay | Admitting: Family Medicine

## 2021-10-02 DIAGNOSIS — J45901 Unspecified asthma with (acute) exacerbation: Secondary | ICD-10-CM

## 2021-10-02 NOTE — Telephone Encounter (Signed)
Requested Prescriptions  Pending Prescriptions Disp Refills  . albuterol (VENTOLIN HFA) 108 (90 Base) MCG/ACT inhaler [Pharmacy Med Name: ALBUTEROL HFA (PROVENTIL) INH] 6.7 each 5    Sig: INHALE 2 PUFFS INTO THE LUNGS EVERY 4 HOURS AS NEEDED FOR WHEEZE OR FOR SHORTNESS OF BREATH     Pulmonology:  Beta Agonists Failed - 10/02/2021  3:25 AM      Failed - One inhaler should last at least one month. If the patient is requesting refills earlier, contact the patient to check for uncontrolled symptoms.      Passed - Valid encounter within last 12 months    Recent Outpatient Visits          4 months ago Blood pressure hypertension, unspecified type   [facility] Delsa Grana, PA-C   10 months ago Hypertension, unspecified type   [facility] Delsa Grana, PA-C   1 year ago Current mild episode of major depressive disorder, unspecified whether recurrent [facility])   [facility] Delsa Grana, PA-C   2 years ago Essential hypertension   [facility] Delsa Grana, PA-C   2 years ago Essential hypertension   [place], Satira Anis, MD      Future Appointments            In 6 months [facility]

## 2022-02-07 ENCOUNTER — Other Ambulatory Visit: Payer: Self-pay

## 2022-02-07 DIAGNOSIS — Z1231 Encounter for screening mammogram for malignant neoplasm of breast: Secondary | ICD-10-CM

## 2022-02-07 DIAGNOSIS — Z13820 Encounter for screening for osteoporosis: Secondary | ICD-10-CM

## 2022-04-05 ENCOUNTER — Telehealth: Payer: Self-pay | Admitting: Family Medicine

## 2022-04-05 ENCOUNTER — Ambulatory Visit (INDEPENDENT_AMBULATORY_CARE_PROVIDER_SITE_OTHER): Payer: Medicare Other

## 2022-04-05 DIAGNOSIS — Z0000 Encounter for general adult medical examination without abnormal findings: Secondary | ICD-10-CM

## 2022-04-05 NOTE — Progress Notes (Signed)
Subjective:   Tricia Vasquez is a 71 y.o. female who presents for Medicare Annual (Subsequent) preventive examination.  Virtual Visit via Telephone Note  I connected with  [name] on 04/05/22 at  2:15 PM EDT by telephone and verified that I am speaking with the correct person using two identifiers.  Location: Patient: home  Provider: [place] Persons participating in the virtual visit: [place]   I discussed the limitations, risks, security and privacy concerns of performing an evaluation and management service by telephone and the availability of in person appointments. The patient expressed understanding and agreed to proceed.  Interactive audio and video telecommunications were attempted between this nurse and patient, however failed, due to patient having technical difficulties OR patient did not have access to video capability.  We continued and completed visit with audio only.  Some vital signs may be absent or patient reported.   Clemetine Marker, LPN   Review of Systems     Cardiac Risk Factors include: advanced age (>58mn, >>43women);dyslipidemia;hypertension     Objective:    There were no vitals filed for this visit. There is no height or weight on file to calculate BMI.     04/05/2022    2:21 PM 04/01/2021    2:36 PM 10/04/2018   10:25 AM 03/24/2016    2:43 PM  Advanced Directives  Does Patient Have a Medical Advance Directive? No No No No  Does patient want to make changes to medical advance directive? Yes (MAU/Ambulatory/Procedural Areas - Information given)     Would patient like information on creating a medical advance directive?  Yes (MAU/Ambulatory/Procedural Areas - Information given) Yes (MAU/Ambulatory/Procedural Areas - Information given) No - patient declined information    Current Medications (verified) Outpatient Encounter Medications as of 04/05/2022  Medication Sig   albuterol (VENTOLIN HFA) 108 (90 Base) MCG/ACT inhaler  INHALE 2 PUFFS INTO THE LUNGS EVERY 4 HOURS AS NEEDED FOR WHEEZE OR FOR SHORTNESS OF BREATH   aspirin EC 81 MG tablet Take 81 mg by mouth 2 (two) times daily.   atenolol (TENORMIN) 50 MG tablet TAKE 1 TABLET BY MOUTH EVERY DAY   BLACK WALNUT POLLEN [place] Inject 1 tablet into the skin once.   Cholecalciferol (VITAMIN D) 2000 units tablet Take 2,000 Units by mouth daily.   Cyanocobalamin (VITAMIN B-12) 5000 MCG SUBL Place 1 tablet under the tongue 3 (three) times a week.   ezetimibe (ZETIA) 10 MG tablet Take 1 tablet (10 mg total) by mouth daily.   fexofenadine (ALLEGRA) 180 MG tablet TAKE 1 TABLET BY MOUTH EVERY DAY   FLUoxetine (PROZAC) 20 MG tablet TAKE 1 TABLET BY MOUTH EVERY DAY   fluticasone (FLONASE) 50 MCG/ACT nasal spray USE 2 SPRAYS IN EACH NOSTRIL ONCE DAILY   Fluticasone-Umeclidin-Vilant (TRELEGY ELLIPTA) 100-62.5-25 MCG/INH AEPB Inhale 1 puff into the lungs daily.   GARLIC OIL PO Take 1 pg/oz/day by mouth once.   hydrochlorothiazide (HYDRODIURIL) 25 MG tablet TAKE 1 TABLET BY MOUTH EVERY DAY   meclizine (ANTIVERT) 25 MG tablet Take 1 tablet (25 mg total) by mouth 3 (three) times daily as needed for dizziness.   Milk Thistle 250 MG CAPS Take 1 capsule by mouth once.   Misc Natural Products (DANDELION ROOT PO) Take 1 tablet by mouth once.   montelukast (SINGULAIR) 10 MG tablet Take 1 tablet (10 mg total) by mouth at bedtime.   Multiple Vitamins-Minerals (MULTIVITAMIN WOMENS 50+ ADV PO) Take by mouth.   nitroGLYCERIN (NITROSTAT) 0.4 MG  SL tablet PLACE 1 TAB UNDER THE TONGUE EVERY 5 MINUTES AS NEEDED FOR CHEST PAIN. CALL 911 MAX 3 PER EPISODE   OVER THE COUNTER MEDICATION Cream of tartar 3 times per month   TURMERIC PO Take 1 tablet by mouth once.   No facility-administered encounter medications on file as of 04/05/2022.    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   Osteoporosis    Sleep apnea    No CPAP   Stroke ([place]) 2013   Past Surgical History:  Procedure Laterality Date   ABDOMINAL HYSTERECTOMY     COLON SURGERY     COLONOSCOPY WITH PROPOFOL N/A 10/04/2018   Procedure: COLONOSCOPY WITH PROPOFOL;  Surgeon: Lucilla Lame, MD;  Location: [place];  Service: Endoscopy;  Laterality: N/A;  sleep apnea   OVARIAN CYST REMOVAL     POLYPECTOMY  10/04/2018   Procedure: POLYPECTOMY;  Surgeon: Lucilla Lame, MD;  Location: [place];  Service: Endoscopy;;   TONSILLECTOMY     Family History  Problem Relation Age of Onset   Hypertension Mother    Hypertension Father    Hypertension Sister    Hypertension Brother    Social History   Socioeconomic History   Marital status: Divorced    Spouse name: Not on file   Number of children: Not on file   Years of education: Not on file   Highest education level: Not on file  Occupational History   Not on file  Tobacco Use   Smoking status: Former    Types: Cigarettes    Quit date: 1970    Years since quitting: 53.4   Smokeless tobacco: Never  Vaping Use   Vaping Use: Never used  Substance and Sexual Activity   Alcohol use: No    Alcohol/week: 0.0 standard drinks    Comment: may have a drink 1x/mo   Drug use: No   Sexual activity: Not Currently  Other Topics Concern   Not on file  Social History Narrative   Pt lives alone    Social Determinants of Health   Financial Resource Strain: Low Risk    Difficulty of Paying Living Expenses: Not hard at all  Food Insecurity: No Food Insecurity   Worried About Charity fundraiser in the Last Year: Never true   [place] in the Last Year: Never true  Transportation Needs: No Transportation Needs   Lack of Transportation (Medical): No   Lack of Transportation (Non-Medical): No  Physical  Activity: Sufficiently Active   Days of Exercise per Week: 7 days   Minutes of Exercise per Session: 30 min  Stress: No Stress Concern Present   Feeling of Stress : Not at all  Social Connections: Socially Isolated   Frequency of Communication with Friends and Family: More than three times a week   Frequency of Social Gatherings with Friends and Family: Once a week   Attends Religious Services: Never   Marine scientist or Organizations: No   Attends Archivist Meetings: Never   Marital Status: Divorced    Tobacco Counseling Counseling given: Not Answered   Clinical Intake:  Pre-visit preparation completed:  Yes  Pain : No/denies pain     Nutritional Risks: None Diabetes: No  How often do you need to have someone help you when you read instructions, pamphlets, or other written materials from your doctor or pharmacy?: 1 - Never    Interpreter Needed?: No  Information entered by :: Clemetine Marker LPN   Activities of Daily Living    04/05/2022    2:22 PM 05/31/2021    1:49 PM  In your present state of health, do you have any difficulty performing the following activities:  Hearing? 0 1  Vision? 0 0  Difficulty concentrating or making decisions? 0 0  Walking or climbing stairs? 0 1  Dressing or bathing? 0 0  Doing errands, shopping? 0 0  Preparing Food and eating ? N   Using the Toilet? N   In the past six months, have you accidently leaked urine? Y   Comment wears pads for protection   Do you have problems with loss of bowel control? N   Managing your Medications? N   Managing your Finances? N   Housekeeping or managing your Housekeeping? N     Patient Care Team: Delsa Grana, PA-C as PCP - General (Family Medicine) Kate Sable, MD as PCP - Cardiology (Cardiology) Anabel Bene, MD as Referring Physician (Neurology)  Indicate any recent Medical Services you may have received from other than [facility] providers in the past year (date may be  approximate).     Assessment:   This is a routine wellness examination for [place].  Hearing/Vision screen Hearing Screening - Comments:: Pt denies hearing difficulty Vision Screening - Comments:: Annual vision screenings done at [place] issues and exercise activities discussed: Current Exercise Habits: Home exercise routine, Type of exercise: walking, Time (Minutes): 30, Frequency (Times/Week): 7, Weekly Exercise (Minutes/Week): 210, Intensity: Moderate, Exercise limited by: None identified   Goals Addressed   None    Depression Screen    04/05/2022    2:20 PM 05/31/2021    1:53 PM 04/01/2021    2:32 PM 11/27/2020   12:25 PM 08/27/2020    1:28 PM 08/22/2019    1:38 PM 12/18/2018    2:04 PM  PHQ 2/9 Scores  PHQ - 2 Score 0 5 3 0 4 0 3  PHQ- 9 Score  '12 12 2 10 '$ 0 8    Fall Risk    04/05/2022    2:22 PM 05/31/2021    1:49 PM 04/01/2021    2:37 PM 11/27/2020   12:25 PM 08/27/2020    1:28 PM  Fall Risk   Falls in the past year? 0 0 0 0 0  Number falls in past yr: 0 0 0 0 0  Injury with Fall? 0 0 0 0 0  Risk for fall due to : No Fall Risks  No Fall Risks    Follow up Falls prevention discussed  Falls prevention discussed      FALL RISK PREVENTION PERTAINING TO THE HOME:  Any stairs in or around the home? Yes  If so, are there any without handrails? No  Home free of loose throw rugs in walkways, pet beds, electrical cords, etc? Yes  Adequate lighting in your home to reduce risk of falls? Yes   ASSISTIVE DEVICES UTILIZED TO PREVENT FALLS:  Life alert? No  Use of a cane, walker or w/c? No  Grab bars in the bathroom? No  Shower chair or bench in shower? No  Elevated toilet  seat or a handicapped toilet? No   TIMED UP AND GO:  Was the test performed? No . Telephonic visit.   Cognitive Function: Normal cognitive status assessed by direct observation by this Nurse Health Advisor. No abnormalities found.          Immunizations Immunization History   Administered Date(s) Administered   Tdap 09/25/2018    TDAP status: Up to date  Flu Vaccine status: Declined, Education has been provided regarding the importance of this vaccine but patient still declined. Advised may receive this vaccine at local pharmacy or Health Dept. Aware to provide a copy of the vaccination record if obtained from local pharmacy or Health Dept. Verbalized acceptance and understanding.  Pneumococcal vaccine status: Declined,  Education has been provided regarding the importance of this vaccine but patient still declined. Advised may receive this vaccine at local pharmacy or Health Dept. Aware to provide a copy of the vaccination record if obtained from local pharmacy or Health Dept. Verbalized acceptance and understanding.   Covid-19 vaccine status: Declined, Education has been provided regarding the importance of this vaccine but patient still declined. Advised may receive this vaccine at local pharmacy or Health Dept.or vaccine clinic. Aware to provide a copy of the vaccination record if obtained from local pharmacy or Health Dept. Verbalized acceptance and understanding.  Qualifies for Shingles Vaccine? Yes   Zostavax completed No   Shingrix Completed?: No.    Education has been provided regarding the importance of this vaccine. Patient has been advised to call insurance company to determine out of pocket expense if they have not yet received this vaccine. Advised may also receive vaccine at local pharmacy or Health Dept. Verbalized acceptance and understanding.  Screening Tests Health Maintenance  Topic Date Due   COVID-19 Vaccine (1) Never done   Pneumonia Vaccine 53+ Years old (1 - PCV) Never done   MAMMOGRAM  Never done   Zoster Vaccines- Shingrix (1 of 2) Never done   DEXA SCAN  Never done   COLONOSCOPY (Pts 45-43yr Insurance coverage will need to be confirmed)  10/05/2023   TETANUS/TDAP  09/25/2028   Hepatitis C Screening  Completed   HPV VACCINES  Aged  Out   INFLUENZA VACCINE  Discontinued    Health Maintenance  Health Maintenance Due  Topic Date Due   COVID-19 Vaccine (1) Never done   Pneumonia Vaccine 72 Years old (1 - PCV) Never done   MAMMOGRAM  Never done   Zoster Vaccines- Shingrix (1 of 2) Never done   DEXA SCAN  Never done    Colorectal cancer screening: Type of screening: Colonoscopy. Completed 10/04/18. Repeat every 5 years  Mammogram status: Ordered 02/07/22. Pt provided with contact info and advised to call to schedule appt.   Bone Density status: Ordered 02/07/22. Pt provided with contact info and advised to call to schedule appt.  Lung Cancer Screening: (Low Dose CT Chest recommended if Age 72-80years, 30 pack-year currently smoking OR have quit w/in 15years.) does not qualify.   Additional Screening:  Hepatitis C Screening: does qualify; Completed 05/31/21  Vision Screening: Recommended annual ophthalmology exams for early detection of glaucoma and other disorders of the eye. Is the patient up to date with their annual eye exam?  Yes  Who is the provider or what is the [facility] the [facility]   Dental Screening: Recommended annual dental exams for proper oral hygiene  Community Resource Referral / Chronic  Care Management: CRR required this visit?  No   CCM required this visit?  No      Plan:     I have personally reviewed and noted the following in the patient's chart:   Medical and social history Use of alcohol, tobacco or illicit drugs  Current medications and supplements including opioid prescriptions.  Functional ability and status Nutritional status Physical activity Advanced directives List of other physicians Hospitalizations, surgeries, and ER visits in previous 12 months Vitals Screenings to include cognitive, depression, and falls Referrals and appointments  In addition, I have reviewed and discussed with patient certain  preventive protocols, quality metrics, and best practice recommendations. A written personalized care plan for preventive services as well as general preventive health recommendations were provided to patient.     Clemetine Marker, LPN   07/05/7472   Nurse Notes: none

## 2022-04-05 NOTE — Telephone Encounter (Signed)
Copied from [place]. Topic: Appointment Scheduling - Scheduling Inquiry for Clinic >> Apr 04, 2022  5:05 PM [place], Apolonio Schneiders wrote: Reason for CRM: Pt returned call and agreed that she can do her AWV appt tomorrow 04/05/22 over the phone FYI

## 2022-04-05 NOTE — Patient Instructions (Signed)
Tricia Vasquez , Thank you for taking time to come for your Medicare Wellness Visit. I appreciate your ongoing commitment to your health goals. Please review the following plan we discussed and let me know if I can assist you in the future.   Screening recommendations/referrals: Colonoscopy: done 10/04/18. Repeat 10/2023 Mammogram: Please call [phone] to schedule your mammogram and bone density screening.  Bone Density: due Recommended yearly ophthalmology/optometry visit for glaucoma screening and checkup Recommended yearly dental visit for hygiene and checkup  Vaccinations: Influenza vaccine: declined Pneumococcal vaccine: declined Tdap vaccine: done 09/25/18 Shingles vaccine: Shingrix discussed. Please contact your pharmacy for coverage information.  Covid-19:due  Advanced directives: Advance directive discussed with you today. I have provided a copy for you to complete at home and have notarized. Once this is complete please bring a copy in to our office so we can scan it into your chart.   Conditions/risks identified: Keep up the great work!  Next appointment: Follow up in one year for your annual wellness visit    Preventive Care 72 Years and Older, Female Preventive care refers to lifestyle choices and visits with your health care provider that can promote health and wellness. What does preventive care include? A yearly physical exam. This is also called an annual well check. Dental exams once or twice a year. Routine eye exams. Ask your health care provider how often you should have your eyes checked. Personal lifestyle choices, including: Daily care of your teeth and gums. Regular physical activity. Eating a healthy diet. Avoiding tobacco and drug use. Limiting alcohol use. Practicing safe sex. Taking low-dose aspirin every day. Taking vitamin and mineral supplements as recommended by your health care provider. What happens during an annual well check? The services  and screenings done by your health care provider during your annual well check will depend on your age, overall health, lifestyle risk factors, and family history of disease. Counseling  Your health care provider may ask you questions about your: Alcohol use. Tobacco use. Drug use. Emotional well-being. Home and relationship well-being. Sexual activity. Eating habits. History of falls. Memory and ability to understand (cognition). Work and work Statistician. Reproductive health. Screening  You may have the following tests or measurements: Height, weight, and BMI. Blood pressure. Lipid and cholesterol levels. These may be checked every 5 years, or more frequently if you are over 35 years old. Skin check. Lung cancer screening. You may have this screening every year starting at age 52 if you have a 30-pack-year history of smoking and currently smoke or have quit within the past 15 years. Fecal occult blood test (FOBT) of the stool. You may have this test every year starting at age 27. Flexible sigmoidoscopy or colonoscopy. You may have a sigmoidoscopy every 5 years or a colonoscopy every 10 years starting at age 27. Hepatitis C blood test. Hepatitis B blood test. Sexually transmitted disease (STD) testing. Diabetes screening. This is done by checking your blood sugar (glucose) after you have not eaten for a while (fasting). You may have this done every 1-3 years. Bone density scan. This is done to screen for osteoporosis. You may have this done starting at age 69. Mammogram. This may be done every 1-2 years. Talk to your health care provider about how often you should have regular mammograms. Talk with your health care provider about your test results, treatment options, and if necessary, the need for more tests. Vaccines  Your health care provider may recommend certain vaccines, such as: Influenza vaccine.  This is recommended every year. Tetanus, diphtheria, and acellular pertussis  (Tdap, Td) vaccine. You may need a Td booster every 10 years. Zoster vaccine. You may need this after age 20. Pneumococcal 13-valent conjugate (PCV13) vaccine. One dose is recommended after age 57. Pneumococcal polysaccharide (PPSV23) vaccine. One dose is recommended after age 4. Talk to your health care provider about which screenings and vaccines you need and how often you need them. This information is not intended to replace advice given to you by your health care provider. Make sure you discuss any questions you have with your health care provider. Document Released: 11/13/2015 Document Revised: 07/06/2016 Document Reviewed: 08/18/2015 Elsevier Interactive Patient Education  2017 [place] Prevention in the Home Falls can cause injuries. They can happen to people of all ages. There are many things you can do to make your home safe and to help prevent falls. What can I do on the outside of my home? Regularly fix the edges of walkways and driveways and fix any cracks. Remove anything that might make you trip as you walk through a door, such as a raised step or threshold. Trim any bushes or trees on the path to your home. Use bright outdoor lighting. Clear any walking paths of anything that might make someone trip, such as rocks or tools. Regularly check to see if handrails are loose or broken. Make sure that both sides of any steps have handrails. Any raised decks and porches should have guardrails on the edges. Have any leaves, snow, or ice cleared regularly. Use sand or salt on walking paths during winter. Clean up any spills in your garage right away. This includes oil or grease spills. What can I do in the bathroom? Use night lights. Install grab bars by the toilet and in the tub and shower. Do not use towel bars as grab bars. Use non-skid mats or decals in the tub or shower. If you need to sit down in the shower, use a plastic, non-slip stool. Keep the floor dry. Clean  up any water that spills on the floor as soon as it happens. Remove soap buildup in the tub or shower regularly. Attach bath mats securely with double-sided non-slip rug tape. Do not have throw rugs and other things on the floor that can make you trip. What can I do in the bedroom? Use night lights. Make sure that you have a light by your bed that is easy to reach. Do not use any sheets or blankets that are too big for your bed. They should not hang down onto the floor. Have a firm chair that has side arms. You can use this for support while you get dressed. Do not have throw rugs and other things on the floor that can make you trip. What can I do in the kitchen? Clean up any spills right away. Avoid walking on wet floors. Keep items that you use a lot in easy-to-reach places. If you need to reach something above you, use a strong step stool that has a grab bar. Keep electrical cords out of the way. Do not use floor polish or wax that makes floors slippery. If you must use wax, use non-skid floor wax. Do not have throw rugs and other things on the floor that can make you trip. What can I do with my stairs? Do not leave any items on the stairs. Make sure that there are handrails on both sides of the stairs and use them. Fix handrails  that are broken or loose. Make sure that handrails are as long as the stairways. Check any carpeting to make sure that it is firmly attached to the stairs. Fix any carpet that is loose or worn. Avoid having throw rugs at the top or bottom of the stairs. If you do have throw rugs, attach them to the floor with carpet tape. Make sure that you have a light switch at the top of the stairs and the bottom of the stairs. If you do not have them, ask someone to add them for you. What else can I do to help prevent falls? Wear shoes that: Do not have high heels. Have rubber bottoms. Are comfortable and fit you well. Are closed at the toe. Do not wear sandals. If you  use a stepladder: Make sure that it is fully opened. Do not climb a closed stepladder. Make sure that both sides of the stepladder are locked into place. Ask someone to hold it for you, if possible. Clearly mark and make sure that you can see: Any grab bars or handrails. First and last steps. Where the edge of each step is. Use tools that help you move around (mobility aids) if they are needed. These include: Canes. Walkers. Scooters. Crutches. Turn on the lights when you go into a dark area. Replace any light bulbs as soon as they burn out. Set up your furniture so you have a clear path. Avoid moving your furniture around. If any of your floors are uneven, fix them. If there are any pets around you, be aware of where they are. Review your medicines with your doctor. Some medicines can make you feel dizzy. This can increase your chance of falling. Ask your doctor what other things that you can do to help prevent falls. This information is not intended to replace advice given to you by your health care provider. Make sure you discuss any questions you have with your health care provider. Document Released: 08/13/2009 Document Revised: 03/24/2016 Document Reviewed: 11/21/2014 Elsevier Interactive Patient Education  2017 [employer].

## 2022-04-27 ENCOUNTER — Other Ambulatory Visit: Payer: Self-pay | Admitting: Family Medicine

## 2022-04-27 DIAGNOSIS — J45901 Unspecified asthma with (acute) exacerbation: Secondary | ICD-10-CM

## 2022-04-27 NOTE — Telephone Encounter (Signed)
Requested medication (s) are due for refill today: Yes  Requested medication (s) are on the active medication list: Yes  Last refill:  08/27/20  Future visit scheduled: Yes  Notes to clinic:  Prescription has expired.    Requested Prescriptions  Pending Prescriptions Disp Refills   montelukast (SINGULAIR) 10 MG tablet [Pharmacy Med Name: MONTELUKAST SOD 10 MG TABLET] 90 tablet 3    Sig: TAKE 1 TABLET BY MOUTH EVERYDAY AT BEDTIME     Pulmonology:  Leukotriene Inhibitors Passed - 04/27/2022  1:27 PM      Passed - Valid encounter within last 12 months    Recent Outpatient Visits           11 months ago Blood pressure hypertension, unspecified type   [facility] Delsa Grana, PA-C   1 year ago Hypertension, unspecified type   [facility] Delsa Grana, PA-C   1 year ago Current mild episode of major depressive disorder, unspecified whether recurrent [facility])   [facility] Delsa Grana, PA-C   2 years ago Essential hypertension   [facility] Delsa Grana, PA-C   3 years ago Essential hypertension   [place], Satira Anis, MD       Future Appointments             In 1 month Delsa Grana, PA-C [facility], [facility]

## 2022-05-15 ENCOUNTER — Other Ambulatory Visit: Payer: Self-pay | Admitting: Family Medicine

## 2022-05-15 DIAGNOSIS — I10 Essential (primary) hypertension: Secondary | ICD-10-CM

## 2022-05-15 DIAGNOSIS — I208 Other forms of angina pectoris: Secondary | ICD-10-CM

## 2022-05-15 DIAGNOSIS — I252 Old myocardial infarction: Secondary | ICD-10-CM

## 2022-06-01 ENCOUNTER — Ambulatory Visit: Payer: Medicare Other | Admitting: Family Medicine

## 2022-06-08 ENCOUNTER — Ambulatory Visit: Payer: Medicare Other | Admitting: Nurse Practitioner

## 2022-06-08 NOTE — Progress Notes (Deleted)
 There were no vitals taken for this visit.   Subjective:    Patient ID: Tricia Vasquez, female    DOB: 11/22/1950, 71 y.o.   MRN: [id number]  HPI: Tricia Vasquez is a 71 y.o. female  No chief complaint on file.  HTN: Her blood pressure today is ***.  She is currently taking atenolol 50 mg daily and hydrochlorothiazide 25 mg daily.  She denies any chest pain, shortness of breath, headaches or blurred vision.  Hyperlipidemia: Her last LDL was 157 on 05/31/2021.  Patient is currently prescribed Zetia 10 mg daily.  Patient states she *** taking it daily.   Asthma: Patient reports her asthma is ***controlled.  She currently uses albuterol inhaler as needed.  She also takes Singulair 10 mg at night.  Trelegy ***  CAD/history of MI: She last saw cardiology on 09/10/2020.  Brian Agbor-Etang, MD. 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  She does not tolerate statins or aspirin.  Patient denies any chest pain or shortness of breath Echo done on 10/06/2020 showed Normal systolic function, mild MR.  No gross abnormalities noted.  Echocardiogram appears stable compared to prior from 2015.  Patient was supposed to follow-up with cardiology office after echo.  History of CVA/vertigo/meniere's disease: Mnire's disease and vertigo patient was referred to ENT at last appointment.  Referral was closed due 2 unable to reach patient.  Patient does have a history of CVAs.  She had accidentally 3 strokes 20 to 30 years ago.   Relevant past medical, surgical, family and social history reviewed and updated as indicated. Interim medical history since our last visit reviewed. Allergies and medications reviewed and updated.  Review of Systems  Per HPI unless specifically indicated above     Objective:    There were no vitals taken for this visit.  Wt Readings from Last 3 Encounters:  06/14/21 161 lb (73 kg)  05/31/21 164 lb 1.6 oz (74.4 kg)  04/01/21 160 lb (72.6 kg)    Physical  Exam  Results for orders placed or performed in visit on 05/31/21  CBC with Differential/Platelet  Result Value Ref Range   WBC 6.3 3.8 - 10.8 Thousand/uL   RBC 4.34 3.80 - 5.10 Million/uL   Hemoglobin 13.1 11.7 - 15.5 g/dL   HCT 39.4 35.0 - 45.0 %   MCV 90.8 80.0 - 100.0 fL   MCH 30.2 27.0 - 33.0 pg   MCHC 33.2 32.0 - 36.0 g/dL   RDW 13.8 11.0 - 15.0 %   Platelets 228 140 - 400 Thousand/uL   MPV 11.8 7.5 - 12.5 fL   Neutro Abs 3,402 1,500 - 7,800 cells/uL   Lymphs Abs 2,111 850 - 3,900 cells/uL   Absolute Monocytes 617 200 - 950 cells/uL   Eosinophils Absolute 132 15 - 500 cells/uL   Basophils Absolute 38 0 - 200 cells/uL   Neutrophils Relative % 54 %   Total Lymphocyte 33.5 %   Monocytes Relative 9.8 %   Eosinophils Relative 2.1 %   Basophils Relative 0.6 %  COMPLETE METABOLIC PANEL WITH GFR  Result Value Ref Range   Glucose, Bld 98 65 - 99 mg/dL   BUN 29 (H) 7 - 25 mg/dL   Creat 1.29 (H) 0.60 - 1.00 mg/dL   eGFR 44 (L) > OR = 60 mL/min/1.73m2   BUN/Creatinine Ratio 22 6 - 22 (calc)   Sodium 140 135 - 146 mmol/L   Potassium 4.7 3.5 - 5.3 mmol/L     Chloride 104 98 - 110 mmol/L   CO2 26 20 - 32 mmol/L   Calcium 10.2 8.6 - 10.4 mg/dL   Total Protein 6.6 6.1 - 8.1 g/dL   Albumin 4.5 3.6 - 5.1 g/dL   Globulin 2.1 1.9 - 3.7 g/dL (calc)   AG Ratio 2.1 1.0 - 2.5 (calc)   Total Bilirubin 0.4 0.2 - 1.2 mg/dL   Alkaline phosphatase (APISO) 77 37 - 153 U/L   AST 24 10 - 35 U/L   ALT 36 (H) 6 - 29 U/L  Lipid panel  Result Value Ref Range   Cholesterol 237 (H) <200 mg/dL   HDL 54 > OR = 50 mg/dL   Triglycerides 132 <150 mg/dL   LDL Cholesterol (Calc) 157 (H) mg/dL (calc)   Total CHOL/HDL Ratio 4.4 <5.0 (calc)   Non-HDL Cholesterol (Calc) 183 (H) <130 mg/dL (calc)      Assessment & Plan:   Problem List Items Addressed This Visit   None    Follow up plan: No follow-ups on file.

## 2022-06-14 ENCOUNTER — Ambulatory Visit: Payer: Medicare Other | Admitting: Family Medicine

## 2022-07-06 ENCOUNTER — Encounter: Payer: Self-pay | Admitting: Family Medicine

## 2022-07-06 ENCOUNTER — Ambulatory Visit (INDEPENDENT_AMBULATORY_CARE_PROVIDER_SITE_OTHER): Payer: Medicare Other | Admitting: Family Medicine

## 2022-07-06 DIAGNOSIS — J4540 Moderate persistent asthma, uncomplicated: Secondary | ICD-10-CM

## 2022-07-06 DIAGNOSIS — Z23 Encounter for immunization: Secondary | ICD-10-CM

## 2022-07-06 DIAGNOSIS — Z5181 Encounter for therapeutic drug level monitoring: Secondary | ICD-10-CM

## 2022-07-06 DIAGNOSIS — I10 Essential (primary) hypertension: Secondary | ICD-10-CM

## 2022-07-06 DIAGNOSIS — F411 Generalized anxiety disorder: Secondary | ICD-10-CM

## 2022-07-06 DIAGNOSIS — E785 Hyperlipidemia, unspecified: Secondary | ICD-10-CM

## 2022-07-06 DIAGNOSIS — M810 Age-related osteoporosis without current pathological fracture: Secondary | ICD-10-CM

## 2022-07-06 DIAGNOSIS — Z91199 Patient's noncompliance with other medical treatment and regimen due to unspecified reason: Secondary | ICD-10-CM

## 2022-07-06 DIAGNOSIS — F320 Major depressive disorder, single episode, mild: Secondary | ICD-10-CM

## 2022-07-06 MED ORDER — SHINGRIX 50 MCG/0.5ML IM SUSR: 0.5000 mL | Freq: Once | INTRAMUSCULAR | 1 refills | Status: AC

## 2022-07-06 NOTE — Patient Instructions (Signed)
Health Maintenance  Topic Date Due   COVID-19 Vaccine (1) Never done   Pneumonia Vaccine (1 - PCV) Never done   Mammogram  Never done   Zoster (Shingles) Vaccine (1 of 2) Never done   DEXA scan (bone density measurement)  Never done   Colon Cancer Screening  10/05/2023   Tetanus Vaccine  09/25/2028   Hepatitis C Screening: USPSTF Recommendation to screen - Ages 18-72 yo.  Completed   HPV Vaccine  Aged Out   Flu Shot  Discontinued   Please call to schedule your mammogram and bone density test [facility] [address], [place], [place] [address] Scheduling phone #: [phone]

## 2022-07-06 NOTE — Progress Notes (Signed)
Name: Tricia Vasquez   MRN: [id number]    DOB: 25-May-1951   Date:07/06/2022       Progress Note  No chief complaint on file.    Subjective:   Tricia Vasquez is a 71 y.o. female, presents to clinic for   Hx of stroke and MI - should be seeing cardiology -  HTN and HLD - unable to tolerate statin BP Readings from Last 3 Encounters:  06/14/21 120/80  05/31/21 122/78  04/01/21 132/72  Managed on HCTZ and atenolol - ACEI/ARB? Lab Results  Component Value Date   CHOL 237 (H) 05/31/2021   HDL 54 05/31/2021   LDLCALC 157 (H) 05/31/2021   TRIG 132 05/31/2021   CHOLHDL 4.4 05/31/2021  Only on zetia      Current Outpatient Medications:    albuterol (VENTOLIN HFA) 108 (90 Base) MCG/ACT inhaler, INHALE 2 PUFFS INTO THE LUNGS EVERY 4 HOURS AS NEEDED FOR WHEEZE OR FOR SHORTNESS OF BREATH, Disp: 6.7 each, Rfl: 5   aspirin EC 81 MG tablet, Take 81 mg by mouth 2 (two) times daily., Disp: , Rfl:    atenolol (TENORMIN) 50 MG tablet, TAKE 1 TABLET BY MOUTH EVERY DAY, Disp: 90 tablet, Rfl: 3   BLACK WALNUT POLLEN [place], Inject 1 tablet into the skin once., Disp: , Rfl:    Cholecalciferol (VITAMIN D) 2000 units tablet, Take 2,000 Units by mouth daily., Disp: , Rfl:    Cyanocobalamin (VITAMIN B-12) 5000 MCG SUBL, Place 1 tablet under the tongue 3 (three) times a week., Disp: , Rfl:    ezetimibe (ZETIA) 10 MG tablet, Take 1 tablet (10 mg total) by mouth daily., Disp: 90 tablet, Rfl: 3   fexofenadine (ALLEGRA) 180 MG tablet, TAKE 1 TABLET BY MOUTH EVERY DAY, Disp: 90 tablet, Rfl: 3   FLUoxetine (PROZAC) 20 MG tablet, TAKE 1 TABLET BY MOUTH EVERY DAY, Disp: 90 tablet, Rfl: 0   fluticasone (FLONASE) 50 MCG/ACT nasal spray, USE 2 SPRAYS IN EACH NOSTRIL ONCE DAILY, Disp: 48 mL, Rfl: 3   Fluticasone-Umeclidin-Vilant (TRELEGY ELLIPTA) 100-62.5-25 MCG/INH AEPB, Inhale 1 puff into the lungs daily., Disp: 28 each, Rfl: 11   GARLIC OIL PO, Take 1 pg/oz/day by mouth once., Disp: , Rfl:     hydrochlorothiazide (HYDRODIURIL) 25 MG tablet, TAKE 1 TABLET BY MOUTH EVERY DAY, Disp: 90 tablet, Rfl: 3   meclizine (ANTIVERT) 25 MG tablet, Take 1 tablet (25 mg total) by mouth 3 (three) times daily as needed for dizziness., Disp: 60 tablet, Rfl: 5   Milk Thistle 250 MG CAPS, Take 1 capsule by mouth once., Disp: , Rfl:    Misc Natural Products (DANDELION ROOT PO), Take 1 tablet by mouth once., Disp: , Rfl:    montelukast (SINGULAIR) 10 MG tablet, TAKE 1 TABLET BY MOUTH EVERYDAY AT BEDTIME, Disp: 90 tablet, Rfl: 0   Multiple Vitamins-Minerals (MULTIVITAMIN WOMENS 50+ ADV PO), Take by mouth., Disp: , Rfl:    nitroGLYCERIN (NITROSTAT) 0.4 MG SL tablet, TAKE 1 TAB UNDER THE TONGUE EVERY 5 MIN AS NEEDED FOR CHEST PAIN. CALL 911. MAX 3 PER EPISODE, Disp: 25 tablet, Rfl: 0   OVER THE COUNTER MEDICATION, Cream of tartar 3 times per month, Disp: , Rfl:    TURMERIC PO, Take 1 tablet by mouth once., Disp: , Rfl:   Patient Active Problem List   Diagnosis Date Noted   S/P abdominal hysterectomy 06/14/2021   Coronary artery disease involving native heart 05/31/2021   Current mild episode of major depressive disorder,  unspecified whether recurrent ([place]) 05/31/2021   Special screening for malignant neoplasms, colon    Benign neoplasm of transverse colon    Benign neoplasm of ascending colon    Cervical radiculopathy 12/22/2017   GAD (generalized anxiety disorder) 09/30/2016   Moderate persistent asthma without complication 68/34/1962   Apnea, sleep 04/11/2016   Osteoporosis, 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   Meniere's disease 04/11/2016   Decreased potassium in the blood 04/11/2016   Hypertension 04/11/2016   Hyperlipidemia 04/11/2016   Chronic hepatitis C without hepatic coma ([place]) 04/11/2016   Depression 04/11/2016   Asthma without status asthmaticus 04/11/2016   Chronic kidney disease (CKD), stage I 03/24/2016   Hx of myocardial infarction 03/24/2016   Hx of hepatitis C 03/24/2016   Hx of completed  stroke 03/24/2016   Preventative health care 03/24/2016   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   Cerebrovascular accident (CVA) ([place]) 08/06/2014   Headache disorder 08/06/2014    Past Surgical History:  Procedure Laterality Date   ABDOMINAL HYSTERECTOMY     COLON SURGERY     COLONOSCOPY WITH PROPOFOL N/A 10/04/2018   Procedure: COLONOSCOPY WITH PROPOFOL;  Surgeon: Lucilla Lame, MD;  Location: [facility];  Service: Endoscopy;  Laterality: N/A;  sleep apnea   OVARIAN CYST REMOVAL     POLYPECTOMY  10/04/2018   Procedure: POLYPECTOMY;  Surgeon: Lucilla Lame, MD;  Location: [facility];  Service: Endoscopy;;   TONSILLECTOMY      Family History  Problem Relation Age of Onset   Hypertension Mother    Hypertension Father    Hypertension Sister    Hypertension Brother     Social History   Tobacco Use   Smoking status: Former    Types: Cigarettes    Quit date: 1970    Years since quitting: 53.7   Smokeless tobacco: Never  Vaping Use   Vaping Use: Never used  Substance Use Topics   Alcohol use: No    Alcohol/week: 0.0 standard drinks of alcohol    Comment: may have a drink 1x/mo   Drug use: No     Allergies  Allergen Reactions   Penicillins Swelling and Rash        Statins Shortness Of Breath    And chest pain   Aspirin Other (See Comments)    Tongue swelling    Health Maintenance  Topic Date Due   COVID-19 Vaccine (1) Never done   Pneumonia Vaccine 83+ Years old (1 - PCV) Never done   MAMMOGRAM  Never done   Zoster Vaccines- Shingrix (1 of 2) Never done   DEXA SCAN  Never done   COLONOSCOPY (Pts 45-53yr Insurance coverage will need to be confirmed)  10/05/2023   TETANUS/TDAP  09/25/2028   Hepatitis C Screening  Completed   HPV VACCINES  Aged Out   INFLUENZA VACCINE  Discontinued    Chart Review Today:   Review of Systems   Objective:   There were no vitals filed for this visit.  There is no height or weight on file to calculate  BMI.  Physical Exam      Assessment & Plan:   Problem List Items Addressed This Visit       Cardiovascular and Mediastinum   Primary hypertension - Primary   Relevant Orders   COMPLETE METABOLIC PANEL WITH GFR     Respiratory   Moderate persistent asthma without complication     Musculoskeletal and Integument   Osteoporosis, post-menopausal   Relevant Orders   COMPLETE  METABOLIC PANEL WITH GFR     Other   Hyperlipidemia   Relevant Orders   Lipid panel   COMPLETE METABOLIC PANEL WITH GFR   GAD (generalized anxiety disorder)   Current mild episode of major depressive disorder, unspecified whether recurrent ([place])   Other Visit Diagnoses     Need for shingles vaccine       Relevant Medications   Zoster Vaccine Adjuvanted [facility]) injection   Need for pneumococcal vaccination       Encounter for medication monitoring       Relevant Orders   Lipid panel   COMPLETE METABOLIC PANEL WITH GFR   CBC with Differential/Platelet          Pt no-showed for appt Hx of no-show, cancellations and medical non-compliance A warning letter will be sent to pt explaining that she needs to be seen in office minimum 2x a year with her pmhx and current med problems and conditions and she will be discharged from the practice and we will not be able to continue to be her PCP if she is unable to come for appts or follow medical recommendations.  No follow-ups on file.   Delsa Grana, PA-C 07/06/22 1:47 PM

## 2022-07-12 ENCOUNTER — Encounter: Payer: Self-pay | Admitting: Family Medicine

## 2022-07-12 ENCOUNTER — Ambulatory Visit (INDEPENDENT_AMBULATORY_CARE_PROVIDER_SITE_OTHER): Payer: Medicare Other | Admitting: Family Medicine

## 2022-07-12 ENCOUNTER — Other Ambulatory Visit: Payer: Self-pay

## 2022-07-12 VITALS — BP 132/74 | HR 68 | Temp 98.1°F | Resp 16 | Ht 60.0 in | Wt 161.9 lb

## 2022-07-12 DIAGNOSIS — Z888 Allergy status to other drugs, medicaments and biological substances status: Secondary | ICD-10-CM

## 2022-07-12 DIAGNOSIS — I10 Essential (primary) hypertension: Secondary | ICD-10-CM

## 2022-07-12 DIAGNOSIS — Z8673 Personal history of transient ischemic attack (TIA), and cerebral infarction without residual deficits: Secondary | ICD-10-CM

## 2022-07-12 DIAGNOSIS — Z23 Encounter for immunization: Secondary | ICD-10-CM

## 2022-07-12 DIAGNOSIS — F320 Major depressive disorder, single episode, mild: Secondary | ICD-10-CM | POA: Diagnosis not present

## 2022-07-12 DIAGNOSIS — E785 Hyperlipidemia, unspecified: Secondary | ICD-10-CM

## 2022-07-12 DIAGNOSIS — N1831 Chronic kidney disease, stage 3a: Secondary | ICD-10-CM

## 2022-07-12 DIAGNOSIS — F439 Reaction to severe stress, unspecified: Secondary | ICD-10-CM

## 2022-07-12 DIAGNOSIS — I252 Old myocardial infarction: Secondary | ICD-10-CM

## 2022-07-12 DIAGNOSIS — I2510 Atherosclerotic heart disease of native coronary artery without angina pectoris: Secondary | ICD-10-CM

## 2022-07-12 MED ORDER — FLUOXETINE HCL 20 MG PO TABS: 30.0000 mg | ORAL_TABLET | Freq: Every day | ORAL | 1 refills | Status: DC

## 2022-07-12 MED ORDER — ATENOLOL 50 MG PO TABS: 50.0000 mg | ORAL_TABLET | Freq: Every day | ORAL | 1 refills | Status: DC

## 2022-07-12 NOTE — Assessment & Plan Note (Signed)
Reported in 1987 and 2015 Allergic to statin Not on ASA BP fairly well controlled

## 2022-07-12 NOTE — Assessment & Plan Note (Signed)
Unable to take statin ASA and not on ACEI/ARB but on atenolol

## 2022-07-12 NOTE — Assessment & Plan Note (Signed)
Multiple stressors, increase prozac CCM SW consult Therapist would be helpful if pt was willing

## 2022-07-12 NOTE — Assessment & Plan Note (Signed)
Overall well controlled on HCTZ and atenolol BP Readings from Last 3 Encounters:  07/12/22 132/74  06/14/21 120/80  05/31/21 122/78  But gradually decreasing renal function - after labs result anticipate changing meds to add ACEI/ARB (possibly d/c HCTZ 25 and start lisinopril-HCTZ 10-12.5)

## 2022-07-12 NOTE — Assessment & Plan Note (Signed)
Needs to get f/up with cardiology, she did establish in 2021

## 2022-07-12 NOTE — Progress Notes (Signed)
Name: Tricia Vasquez   MRN: [id number]    DOB: 14-Mar-1951   Date:07/12/2022       Progress Note  Chief Complaint  Patient presents with   Follow-up   Hypertension   Depression   Hyperlipidemia     Subjective:   Tricia Vasquez is a 71 y.o. female, presents to clinic for routine f/up - recently had OV scheduled but did not come     Hx of stroke and MI - should be seeing cardiology -  HTN and HLD - unable to tolerate statin, given zetia - but not taking She did est with Dr. Jasmine Vasquez in 2021 but again was lost to f/up after that Lab Results  Component Value Date   CHOL 237 (H) 05/31/2021   HDL 54 05/31/2021   LDLCALC 157 (H) 05/31/2021   TRIG 132 05/31/2021   CHOLHDL 4.4 05/31/2021   HTN Managed on HCTZ and atenolol, BP at goal today, no concerns with pulse/HR BP Readings from Last 3 Encounters:  07/12/22 132/74  06/14/21 120/80  05/31/21 122/78   Pulse Readings from Last 3 Encounters:  07/12/22 68  05/31/21 66  09/10/20 60  Not on ACEI/ARB - no  CKD - last GFR was 44 - she did consult with nephrologist but again did not f/up Did labs this am  MDD - started prozac in 1980's when several family members were in the hospital and cousin committed suicide She reports worse mood lately with death of sister, illness that she was not told about, unable to get into her sisters home where her stuff is located She would like to increase meds     07/12/2022    2:36 PM 04/05/2022    2:20 PM 05/31/2021    1:53 PM  Depression screen PHQ 2/9  Decreased Interest 3 0 2  Down, Depressed, Hopeless 3 0 3  PHQ - 2 Score 6 0 5  Altered sleeping 3  0  Tired, decreased energy 3  3  Change in appetite 3  2  Feeling bad or failure about yourself  0  1  Trouble concentrating 0  0  Moving slowly or fidgety/restless 0  1  Suicidal thoughts 0  0  PHQ-9 Score 15  12  Difficult doing work/chores Very difficult  Not difficult at all       Current Outpatient Medications:     albuterol (VENTOLIN HFA) 108 (90 Base) MCG/ACT inhaler, INHALE 2 PUFFS INTO THE LUNGS EVERY 4 HOURS AS NEEDED FOR WHEEZE OR FOR SHORTNESS OF BREATH, Disp: 6.7 each, Rfl: 5   aspirin EC 81 MG tablet, Take 81 mg by mouth 2 (two) times daily., Disp: , Rfl:    atenolol (TENORMIN) 50 MG tablet, TAKE 1 TABLET BY MOUTH EVERY DAY, Disp: 90 tablet, Rfl: 3   Cholecalciferol (VITAMIN D) 2000 units tablet, Take 2,000 Units by mouth daily., Disp: , Rfl:    Cyanocobalamin (VITAMIN B-12) 5000 MCG SUBL, Place 1 tablet under the tongue 3 (three) times a week., Disp: , Rfl:    ezetimibe (ZETIA) 10 MG tablet, Take 1 tablet (10 mg total) by mouth daily., Disp: 90 tablet, Rfl: 3   fexofenadine (ALLEGRA) 180 MG tablet, TAKE 1 TABLET BY MOUTH EVERY DAY, Disp: 90 tablet, Rfl: 3   FLUoxetine (PROZAC) 20 MG tablet, TAKE 1 TABLET BY MOUTH EVERY DAY, Disp: 90 tablet, Rfl: 0   fluticasone (FLONASE) 50 MCG/ACT nasal spray, USE 2 SPRAYS IN EACH NOSTRIL ONCE DAILY, Disp: 48 mL, Rfl: 3  Fluticasone-Umeclidin-Vilant (TRELEGY ELLIPTA) 100-62.5-25 MCG/INH AEPB, Inhale 1 puff into the lungs daily., Disp: 28 each, Rfl: 11   GARLIC OIL PO, Take 1 pg/oz/day by mouth once., Disp: , Rfl:    hydrochlorothiazide (HYDRODIURIL) 25 MG tablet, TAKE 1 TABLET BY MOUTH EVERY DAY, Disp: 90 tablet, Rfl: 3   meclizine (ANTIVERT) 25 MG tablet, Take 1 tablet (25 mg total) by mouth 3 (three) times daily as needed for dizziness., Disp: 60 tablet, Rfl: 5   Milk Thistle 250 MG CAPS, Take 1 capsule by mouth once., Disp: , Rfl:    Misc Natural Products (DANDELION ROOT PO), Take 1 tablet by mouth once., Disp: , Rfl:    montelukast (SINGULAIR) 10 MG tablet, TAKE 1 TABLET BY MOUTH EVERYDAY AT BEDTIME, Disp: 90 tablet, Rfl: 0   Multiple Vitamins-Minerals (MULTIVITAMIN WOMENS 50+ ADV PO), Take by mouth., Disp: , Rfl:    nitroGLYCERIN (NITROSTAT) 0.4 MG SL tablet, TAKE 1 TAB UNDER THE TONGUE EVERY 5 MIN AS NEEDED FOR CHEST PAIN. CALL 911. MAX 3 PER EPISODE, Disp:  25 tablet, Rfl: 0   OVER THE COUNTER MEDICATION, Cream of tartar 3 times per month, Disp: , Rfl:    TURMERIC PO, Take 1 tablet by mouth once., Disp: , Rfl:   Patient Active Problem List   Diagnosis Date Noted   S/P abdominal hysterectomy 06/14/2021   Coronary artery disease involving native heart 05/31/2021   Current mild episode of major depressive disorder, unspecified whether recurrent ([place]) 05/31/2021   Benign neoplasm of transverse colon    Benign neoplasm of ascending colon    Cervical radiculopathy 12/22/2017   GAD (generalized anxiety disorder) 09/30/2016   Moderate persistent asthma without complication 47/65/4650   Apnea, sleep 04/11/2016   Osteoporosis, 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   Primary hypertension 04/11/2016   Hyperlipidemia 04/11/2016   Depression 04/11/2016   Hx of myocardial infarction 03/24/2016   Hx of hepatitis C 03/24/2016   Hx of completed stroke 03/24/2016   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   Headache disorder 08/06/2014    Past Surgical History:  Procedure Laterality Date   ABDOMINAL HYSTERECTOMY     COLON SURGERY     COLONOSCOPY WITH PROPOFOL N/A 10/04/2018   Procedure: COLONOSCOPY WITH PROPOFOL;  Surgeon: Lucilla Lame, MD;  Location: [facility];  Service: Endoscopy;  Laterality: N/A;  sleep apnea   OVARIAN CYST REMOVAL     POLYPECTOMY  10/04/2018   Procedure: POLYPECTOMY;  Surgeon: Lucilla Lame, MD;  Location: [facility];  Service: Endoscopy;;   TONSILLECTOMY      Family History  Problem Relation Age of Onset   Hypertension Mother    Hypertension Father    Hypertension Sister    Hypertension Brother     Social History   Tobacco Use   Smoking status: Former    Types: Cigarettes    Quit date: 1970    Years since quitting: 53.7   Smokeless tobacco: Never  Vaping Use   Vaping Use: Never used  Substance Use Topics   Alcohol use: No    Alcohol/week: 0.0 standard drinks of alcohol    Comment: may have a drink  1x/mo   Drug use: No     Allergies  Allergen Reactions   Penicillins Swelling and Rash        Statins Shortness Of Breath    And chest pain   Aspirin Other (See Comments)    Tongue swelling    Health Maintenance  Topic Date Due   MAMMOGRAM  Never done   DEXA SCAN  Never done   COLONOSCOPY (Pts 45-78yr Insurance coverage will need to be confirmed)  10/05/2023   TETANUS/TDAP  09/25/2028   Hepatitis C Screening  Completed   HPV VACCINES  Aged Out   Pneumonia Vaccine 72 Years old  Discontinued   INFLUENZA VACCINE  Discontinued   COVID-19 Vaccine  Discontinued   Zoster Vaccines- Shingrix  Discontinued    Chart Review Today: I personally reviewed active problem list, medication list, allergies, family history, social history, health maintenance, notes from last encounter, lab results, imaging with the patient/caregiver today.   Review of Systems  Constitutional: Negative.   HENT: Negative.    Eyes: Negative.   Respiratory: Negative.    Cardiovascular: Negative.   Gastrointestinal: Negative.   Endocrine: Negative.   Genitourinary: Negative.   Musculoskeletal: Negative.   Skin: Negative.   Allergic/Immunologic: Negative.   Neurological: Negative.   Hematological: Negative.   Psychiatric/Behavioral: Negative.    All other systems reviewed and are negative.    Objective:   Vitals:   07/12/22 1443  BP: 132/74  Pulse: 68  Resp: 16  Temp: 98.1 F (36.7 C)  TempSrc: Oral  SpO2: 97%  Weight: 161 lb 14.4 oz (73.4 kg)  Height: 5' (1.524 m)    Body mass index is 31.62 kg/m.  Physical Exam Vitals and nursing note reviewed.  Constitutional:      General: She is not in acute distress.    Appearance: Normal appearance. She is well-developed. She is not ill-appearing, toxic-appearing or diaphoretic.  HENT:     Head: Normocephalic and atraumatic.     Right Ear: External ear normal.     Left Ear: External ear normal.     Nose: Nose normal.  Eyes:     General:  No scleral icterus.       Right eye: No discharge.        Left eye: No discharge.     Conjunctiva/sclera: Conjunctivae normal.  Neck:     Trachea: No tracheal deviation.  Cardiovascular:     Rate and Rhythm: Normal rate and regular rhythm.     Pulses: Normal pulses.     Heart sounds: Normal heart sounds.  Pulmonary:     Effort: Pulmonary effort is normal. No respiratory distress.     Breath sounds: Normal breath sounds. No stridor.  Abdominal:     General: Bowel sounds are normal.     Palpations: Abdomen is soft.  Musculoskeletal:     Right lower leg: No edema.     Left lower leg: No edema.  Skin:    General: Skin is warm and dry.     Capillary Refill: Capillary refill takes less than 2 seconds.     Findings: No rash.  Neurological:     Mental Status: She is alert. Mental status is at baseline.     Motor: No abnormal muscle tone.     Coordination: Coordination normal.  Psychiatric:        Mood and Affect: Mood normal.        Behavior: Behavior normal.         Assessment & Plan:      Problem List Items Addressed This Visit       Cardiovascular and Mediastinum   Primary hypertension - Primary    Overall well controlled on HCTZ and atenolol BP Readings from Last 3 Encounters:  07/12/22 132/74  06/14/21 120/80  05/31/21 122/78  But gradually decreasing renal function -  after labs result anticipate changing meds to add ACEI/ARB (possibly d/c HCTZ 25 and start lisinopril-HCTZ 10-12.5)      Relevant Orders   Ambulatory referral to Cardiology   AMB Referral to Chronic Care Management Services   Coronary artery disease involving native heart    Needs to get f/up with cardiology, she did establish in 2021      Relevant Orders   Ambulatory referral to Cardiology   AMB Referral to Chronic Care Management Services     Other   Hx of myocardial infarction    Unable to take statin ASA and not on ACEI/ARB but on atenolol       Relevant Orders   Ambulatory referral  to Cardiology   AMB Referral to Chronic Care Management Services   Hx of completed stroke    Reported in 1987 and 2015 Allergic to statin Not on ASA BP fairly well controlled      Hyperlipidemia    Uncontrolled Lab Results  Component Value Date   CHOL 237 (H) 05/31/2021   HDL 54 05/31/2021   LDLCALC 157 (H) 05/31/2021   TRIG 132 05/31/2021   CHOLHDL 4.4 05/31/2021  she reports SOB/swelling rxn to statin She refused zetia - concerned for same rxn Consult cardiology or pharmacy for repatha or praluent?      Relevant Orders   Ambulatory referral to Cardiology   AMB Referral to Chronic Care Management Services   Current mild episode of major depressive disorder, unspecified whether recurrent (HCC)    Multiple stressors, increase prozac CCM SW consult Therapist would be helpful if pt was willing      Relevant Medications   FLUoxetine (PROZAC) 20 MG tablet   Other Relevant Orders   AMB Referral to Chronic Care Management Services   Other Visit Diagnoses     Need for shingles vaccine       Need for pneumococcal vaccination       Situational stress       Relevant Medications   FLUoxetine (PROZAC) 20 MG tablet   Other Relevant Orders   AMB Referral to Chronic Care Management Services   Stage 3a chronic kidney disease ([place])       Relevant Orders   Ambulatory referral to Cardiology   AMB Referral to Chronic Care Management Services   Allergy to statin medication       Relevant Orders   Ambulatory referral to Cardiology   AMB Referral to Chronic Care Management Services      Lipids, CMP and CBC previously ordered and completed this am  Return in about 3 months (around 10/11/2022).   Delsa Grana, PA-C 07/12/22 3:06 PM

## 2022-07-12 NOTE — Assessment & Plan Note (Signed)
Uncontrolled Lab Results  Component Value Date   CHOL 237 (H) 05/31/2021   HDL 54 05/31/2021   LDLCALC 157 (H) 05/31/2021   TRIG 132 05/31/2021   CHOLHDL 4.4 05/31/2021  she reports SOB/swelling rxn to statin She refused zetia - concerned for same rxn Consult cardiology or pharmacy for repatha or praluent?

## 2022-07-13 ENCOUNTER — Other Ambulatory Visit: Payer: Self-pay | Admitting: Family Medicine

## 2022-07-13 DIAGNOSIS — E785 Hyperlipidemia, unspecified: Secondary | ICD-10-CM

## 2022-07-13 LAB — LIPID PANEL
Cholesterol: 249 mg/dL — ABNORMAL HIGH (ref <200)
HDL: 48 mg/dL — ABNORMAL LOW (ref >=50)
LDL Cholesterol (Calc): 170 mg/dL (calc) — ABNORMAL HIGH
Non-HDL Cholesterol (Calc): 201 mg/dL (calc) — ABNORMAL HIGH (ref <130)
Total CHOL/HDL Ratio: 5.2 (calc) — ABNORMAL HIGH (ref <5.0)
Triglycerides: 161 mg/dL — ABNORMAL HIGH (ref <150)

## 2022-07-13 LAB — COMPLETE METABOLIC PANEL WITH GFR
AG Ratio: 2 (calc) (ref 1.0–2.5)
ALT: 26 U/L (ref 6–29)
AST: 19 U/L (ref 10–35)
Albumin: 4.3 g/dL (ref 3.6–5.1)
Alkaline phosphatase (APISO): 70 U/L (ref 37–153)
BUN/Creatinine Ratio: 31 (calc) — ABNORMAL HIGH (ref 6–22)
BUN: 34 mg/dL — ABNORMAL HIGH (ref 7–25)
CO2: 27 mmol/L (ref 20–32)
Calcium: 9.5 mg/dL (ref 8.6–10.4)
Chloride: 106 mmol/L (ref 98–110)
Creat: 1.1 mg/dL — ABNORMAL HIGH (ref 0.60–1.00)
Globulin: 2.2 g/dL (calc) (ref 1.9–3.7)
Glucose, Bld: 97 mg/dL (ref 65–99)
Potassium: 4 mmol/L (ref 3.5–5.3)
Sodium: 143 mmol/L (ref 135–146)
Total Bilirubin: 0.3 mg/dL (ref 0.2–1.2)
Total Protein: 6.5 g/dL (ref 6.1–8.1)
eGFR: 53 mL/min/{1.73_m2} — ABNORMAL LOW (ref >=60)

## 2022-07-13 LAB — CBC WITH DIFFERENTIAL/PLATELET
Absolute Monocytes: 499 cells/uL (ref 200–950)
Basophils Absolute: 17 cells/uL (ref 0–200)
Basophils Relative: 0.3 %
Eosinophils Absolute: 191 cells/uL (ref 15–500)
Eosinophils Relative: 3.3 %
HCT: 39.1 % (ref 35.0–45.0)
Hemoglobin: 13.2 g/dL (ref 11.7–15.5)
Lymphs Abs: 2372 cells/uL (ref 850–3900)
MCH: 31.4 pg (ref 27.0–33.0)
MCHC: 33.8 g/dL (ref 32.0–36.0)
MCV: 92.9 fL (ref 80.0–100.0)
MPV: 11.3 fL (ref 7.5–12.5)
Monocytes Relative: 8.6 %
Neutro Abs: 2720 cells/uL (ref 1500–7800)
Neutrophils Relative %: 46.9 %
Platelets: 220 10*3/uL (ref 140–400)
RBC: 4.21 10*6/uL (ref 3.80–5.10)
RDW: 12.4 % (ref 11.0–15.0)
Total Lymphocyte: 40.9 %
WBC: 5.8 10*3/uL (ref 3.8–10.8)

## 2022-07-13 MED ORDER — EZETIMIBE 10 MG PO TABS: 10.0000 mg | ORAL_TABLET | Freq: Every day | ORAL | 3 refills | Status: DC

## 2022-07-19 ENCOUNTER — Other Ambulatory Visit: Payer: Self-pay | Admitting: Physician Assistant

## 2022-07-19 ENCOUNTER — Other Ambulatory Visit: Payer: Self-pay | Admitting: Family Medicine

## 2022-07-19 DIAGNOSIS — H8103 Meniere's disease, bilateral: Secondary | ICD-10-CM

## 2022-07-19 DIAGNOSIS — J45901 Unspecified asthma with (acute) exacerbation: Secondary | ICD-10-CM

## 2022-07-19 DIAGNOSIS — R42 Dizziness and giddiness: Secondary | ICD-10-CM

## 2022-07-20 NOTE — Telephone Encounter (Signed)
Requested Prescriptions  Pending Prescriptions Disp Refills  . montelukast (SINGULAIR) 10 MG tablet [Pharmacy Med Name: MONTELUKAST SOD 10 MG TABLET] 90 tablet 0    Sig: TAKE 1 TABLET BY MOUTH EVERYDAY AT BEDTIME     Pulmonology:  Leukotriene Inhibitors Passed - 07/19/2022 12:36 PM      Passed - Valid encounter within last 12 months    Recent Outpatient Visits          1 week ago Primary hypertension   [facility] Delsa Grana, PA-C   2 weeks ago Primary hypertension   [facility] Delsa Grana, PA-C   1 year ago Blood pressure hypertension, unspecified type   [facility] Delsa Grana, PA-C   1 year ago Hypertension, unspecified type   [facility] Delsa Grana, PA-C   1 year ago Current mild episode of major depressive disorder, unspecified whether recurrent [facility])   [facility] Delsa Grana, PA-C      Future Appointments            In 1 month Agbor-Etang, Aaron Edelman, MD [place] [place]   In 2 months Delsa Grana, PA-C [place]

## 2022-07-29 ENCOUNTER — Telehealth: Payer: Self-pay | Admitting: *Deleted

## 2022-07-29 NOTE — Chronic Care Management (AMB) (Signed)
  Chronic Care Management   Note  07/29/2022 Name: Shantera Monts MRN: [id number] DOB: [birth year]  Tiani Stanbery is a 71 y.o. year old female who is a primary care patient of Delsa Grana, [place]. I reached out to [name] by phone today in response to a referral sent by Ms. [place] PCP.  Ms. Vaux was given information about Chronic Care Management services today including:  CCM service includes personalized support from designated clinical staff supervised by her physician, including individualized plan of care and coordination with other care providers 24/7 contact phone numbers for assistance for urgent and routine care needs. Service will only be billed when office clinical staff spend 20 minutes or more in a month to coordinate care. Only one practitioner may furnish and bill the service in a calendar month. The patient may stop CCM services at any time (effective at the end of the month) by phone call to the office staff. The patient is responsible for co-pay (up to 20% after annual deductible is met) if co-pay is required by the individual health plan.   Patient agreed to services and verbal consent obtained.   Follow up plan: Telephone appointment with care management team member scheduled for:08/17/22  [place]  Direct Dial: [phone]

## 2022-07-29 NOTE — Chronic Care Management (AMB) (Signed)
  Care Coordination   Note   07/29/2022 Name: Tricia Vasquez MRN: [id number] DOB: September 03, 1951  Tricia Vasquez is a 71 y.o. year old female who sees Delsa Grana, [place] for primary care. I reached out to [name] by phone today to offer care coordination services.  [name] was given information about Care Coordination services today including:   The Care Coordination services include support from the care team which includes your Nurse Coordinator, Clinical Social Worker, or Pharmacist.  The Care Coordination team is here to help remove barriers to the health concerns and goals most important to you. Care Coordination services are voluntary, and the patient may decline or stop services at any time by request to their care team member.   Care Coordination Consent Status: Patient agreed to services and verbal consent obtained.   Follow up plan:  Telephone appointment with care coordination team member scheduled for:  08/03/22  Encounter Outcome:  Pt. Scheduled  [place]  Direct Dial: [phone]

## 2022-08-03 ENCOUNTER — Ambulatory Visit: Payer: Self-pay | Admitting: *Deleted

## 2022-08-04 NOTE — Patient Outreach (Signed)
  Care Coordination   Initial Visit Note   08/04/2022 Name: Tricia Vasquez MRN: [id number] DOB: 10-17-1951  Tricia Vasquez is a 71 y.o. year old female who sees Delsa Grana, [place] for primary care. I spoke with  [name] by phone today.  What matters to the patients health and wellness today?    Mental health support and resources   Goals Addressed             This Visit's Progress    Mental Health Resources       Care Coordination Interventions: Phone call to patient to provide mental health support and to provide resources Patient discussed recent loss of her sister and need to settle affairs Stages of grief discussed, grief response normalized PHQ2/PHQ9 completed Solution-Focused Strategies employed:  Active listening / Reflection utilized  Emotional Support Provided Participation in counseling encouraged  Discussed self-care action plan: patient discussed that reading, kntting, working in he yard, comedy shows help her to manage her grief. Patient's children provide emotional support as well Patient agreeable to mental health follow up, this social worker to provide mental health resources         SDOH assessments and interventions completed:  Yes  SDOH Interventions Today    Flowsheet Row Most Recent Value  SDOH Interventions   Food Insecurity Interventions Intervention Not Indicated  Housing Interventions Intervention Not Indicated  Transportation Interventions Intervention Not Indicated        Care Coordination Interventions Activated:  Yes  Care Coordination Interventions:  Yes, provided   Follow up plan: Follow up call scheduled for 08/11/22    Encounter Outcome:  Pt. Visit Completed

## 2022-08-04 NOTE — Patient Instructions (Signed)
Visit Information  Thank you for taking time to visit with me today. Please don't hesitate to contact me if I can be of assistance to you.   Following are the goals we discussed today:   Goals Addressed             This Visit's Progress    Mental Health Resources       Care Coordination Interventions: Phone call to patient to provide mental health support and to provide resources Patient discussed recent loss of her sister and need to settle affairs Stages of grief discussed, grief response normalized PHQ2/PHQ9 completed Solution-Focused Strategies employed:  Active listening / Reflection utilized  Emotional Support Provided Participation in counseling encouraged  Discussed self-care action plan: patient discussed that reading, kntting, working in he yard, comedy shows help her to manage her grief. Patient's children provide emotional support as well Patient agreeable to mental health follow up, this social worker to provide mental health resources        1:30 Our next appointment is by telephone on 08/11/22 at 1:30pm  Please call the care guide team at [phone] if you need to cancel or reschedule your appointment.   If you are experiencing a Mental Health or [place] or need someone to talk to, please call the Suicide and Crisis Lifeline: 988   Patient verbalizes understanding of instructions and care plan provided today and agrees to view in [place]. Active MyChart status and patient understanding of how to access instructions and care plan via MyChart confirmed with patient.     Telephone follow up appointment with care management team member scheduled for:08/11/22  Elliot Gurney, [place] Worker  [facility] Care Management [phone]

## 2022-08-11 ENCOUNTER — Ambulatory Visit: Payer: Self-pay | Admitting: *Deleted

## 2022-08-11 NOTE — Patient Outreach (Signed)
  Care Coordination   Follow Up Visit Note   08/11/2022 Name: Tricia Vasquez MRN: [id number] DOB: [birth year]  Tricia Vasquez is a 71 y.o. year old female who sees Delsa Grana, [place] for primary care. I spoke with  [name] by phone today.  What matters to the patients health and wellness today?  Mental Health support and resources    Goals Addressed             This Visit's Progress    Mental Health Resources       Care Coordination Interventions: Able to get things from sisters home , Phone call to patient to provide mental health support and to provide resources Patient continue to  settle her affairs-working on cleaning out family home Continued encouragement to prioritize herself-contact information for [employer] provided for ongoing follow up [phone]  Active listening / Reflection utilized  Emotional Support Provided Participation in counseling encouraged  Patient agreeable to mental health follow up and will call to schedule appointment         SDOH assessments and interventions completed:  No     Care Coordination Interventions Activated:  Yes  Care Coordination Interventions:  Yes, provided   Follow up plan: Follow up call scheduled for 09/01/22    Encounter Outcome:  Pt. Visit Completed

## 2022-08-11 NOTE — Patient Instructions (Signed)
Visit Information  Thank you for taking time to visit with me today. Please don't hesitate to contact me if I can be of assistance to you.   Following are the goals we discussed today:   Goals Addressed             This Visit's Progress    Mental Health Resources       Care Coordination Interventions: Able to get things from sisters home , Phone call to patient to provide mental health support and to provide resources Patient continue to  settle her affairs-working on cleaning out family home Continued encouragement to prioritize herself-contact information for [employer] provided for ongoing follow up [phone]  Active listening / Reflection utilized  Emotional Support Provided Participation in counseling encouraged  Patient agreeable to mental health follow up and will call to schedule appointment         Our next appointment is by telephone on 09/01/22 at 2pm  Please call the care guide team at [phone] if you need to cancel or reschedule your appointment.   If you are experiencing a Mental Health or [place] or need someone to talk to, please call the Suicide and Crisis Lifeline: 988   Patient verbalizes understanding of instructions and care plan provided today and agrees to view in [place]. Active MyChart status and patient understanding of how to access instructions and care plan via MyChart confirmed with patient.     Telephone follow up appointment with care management team member scheduled for:09/01/22  Elliot Gurney, [place] Worker  [facility] Care Management [phone]

## 2022-08-13 ENCOUNTER — Other Ambulatory Visit: Payer: Self-pay | Admitting: Family Medicine

## 2022-08-13 DIAGNOSIS — F439 Reaction to severe stress, unspecified: Secondary | ICD-10-CM

## 2022-08-13 DIAGNOSIS — F320 Major depressive disorder, single episode, mild: Secondary | ICD-10-CM

## 2022-08-16 ENCOUNTER — Telehealth: Payer: Self-pay

## 2022-08-16 NOTE — Progress Notes (Signed)
Chronic Care Management Pharmacy Assistant   Name: Tricia Vasquez  MRN: [id number] DOB: Oct 10, 1951  Reason for Encounter: Initial Visit Assessment for visit with Junius Argyle, CPP on 08/17/2022 @ 1300   Conditions to be addressed/monitored: CAD, HTN, HLD, Anxiety, Depression, Osteoporosis, and Moderate persistent asthma w/o complication, Meniere's Disease of both ears, Cervical radiculopathy, Hx of MI, Headache disorder,  Primary concerns for visit include: Patient stated her cholesterol is elevated and she is allergic to all statins.   Recent office visits:  07/12/2022 Delsa Grana, PA-C (PCP Office Visit) for Follow-up- Stopped: [place] Pollen, Referral for Cardiology placed, Patient to follow-up in 3 months  07/06/2022 Delsa Grana, PA-C (PCP Office Visit) for HTN- No medication changes noted, Lab orders placed, No follow-up noted  04/05/2022 Clemetine Marker, LPN (PCP Clinical Support Visit) for Medicare Annual Wellness Exam- No medication changes noted, No orders placed,   Recent consult visits:  None ID  Hospital visits:  None in previous 6 months  Medications: Outpatient Encounter Medications as of 08/16/2022  Medication Sig   albuterol (VENTOLIN HFA) 108 (90 Base) MCG/ACT inhaler INHALE 2 PUFFS INTO THE LUNGS EVERY 4 HOURS AS NEEDED FOR WHEEZE OR FOR SHORTNESS OF BREATH   aspirin EC 81 MG tablet Take 81 mg by mouth 2 (two) times daily.   atenolol (TENORMIN) 50 MG tablet Take 1 tablet (50 mg total) by mouth daily.   Cholecalciferol (VITAMIN D) 2000 units tablet Take 2,000 Units by mouth daily.   Cyanocobalamin (VITAMIN B-12) 5000 MCG SUBL Place 1 tablet under the tongue 3 (three) times a week.   ezetimibe (ZETIA) 10 MG tablet Take 1 tablet (10 mg total) by mouth daily.   fexofenadine (ALLEGRA) 180 MG tablet TAKE 1 TABLET BY MOUTH EVERY DAY   FLUoxetine (PROZAC) 20 MG tablet TAKE 1 TABLET BY MOUTH EVERY DAY   fluticasone (FLONASE) 50 MCG/ACT nasal spray USE 2 SPRAYS IN  EACH NOSTRIL ONCE DAILY   Fluticasone-Umeclidin-Vilant (TRELEGY ELLIPTA) 100-62.5-25 MCG/INH AEPB Inhale 1 puff into the lungs daily.   GARLIC OIL PO Take 1 pg/oz/day by mouth once.   hydrochlorothiazide (HYDRODIURIL) 25 MG tablet TAKE 1 TABLET BY MOUTH EVERY DAY   meclizine (ANTIVERT) 25 MG tablet TAKE 1 TABLET BY MOUTH 3 TIMES DAILY AS NEEDED FOR DIZZINESS.   Milk Thistle 250 MG CAPS Take 1 capsule by mouth once.   Misc Natural Products (DANDELION ROOT PO) Take 1 tablet by mouth once.   montelukast (SINGULAIR) 10 MG tablet TAKE 1 TABLET BY MOUTH EVERYDAY AT BEDTIME   Multiple Vitamins-Minerals (MULTIVITAMIN WOMENS 50+ ADV PO) Take by mouth.   nitroGLYCERIN (NITROSTAT) 0.4 MG SL tablet TAKE 1 TAB UNDER THE TONGUE EVERY 5 MIN AS NEEDED FOR CHEST PAIN. CALL 911. MAX 3 PER EPISODE   OVER THE COUNTER MEDICATION Cream of tartar 3 times per month   TURMERIC PO Take 1 tablet by mouth once.   No facility-administered encounter medications on file as of 08/16/2022.   Care Gaps: Mammogram  Dexascan  Star Rating Drugs: None ID  Questions for Clinical Pharmacist:   1.Are you able to connect with Patient? Yes I spoke directly to the patient    2.Confirmed appointment date/time with patient/caregiver? Confirm appointment on 08/17/2022 at 1300 with Riki Rusk, CPP.    3.Visit type telephone    4.Patient/Caregiver instructed to bring medications to appointment. Patient is aware to bring all medications and supplements to appointment   5.What, if any, problems do you have getting your  medications from the pharmacy? None   6.What is your top health concern to discuss at your upcoming visit?  Patient stated her cholesterol is elevated and she is allergic to all statins. She stated that she was prescribed Zetia ,but has not yet started taking this medication. Per patient she is in the process of moving out of the home her and her sister shared prior to her sisters passing.   Patient would  like to speak to you about the side effects of Zetia, and also speak with you about diet that she could potentially go on if she can't take the Zetia either. Patient currently isn't on any diet. Per patient she eats what she likes, but in a proportionate manner. Patient currently does walk 30 minutes daily and does isometric exercising.   7.Have you seen any other providers since your last visit? No   Lynann Bologna, CPA/CMA Clinical Pharmacist Assistant Phone: [phone]

## 2022-08-17 ENCOUNTER — Telehealth: Payer: Federal, State, Local not specified - PPO

## 2022-08-17 NOTE — Progress Notes (Deleted)
Chronic Care Management Pharmacy Note  08/17/2022 Name:  Tricia Vasquez MRN:  [id number] DOB:  01-Feb-1951  Summary: Patient presents for initial CCM consult  Recommendations/Changes made from today's visit: ***  Plan: ***  Recommended Problem List Changes:  Add: Chronic Kidney Disease Stage 3a\  Subjective: Tricia Vasquez is an 71 y.o. year old female who is a primary patient of Delsa Grana, [place].  The CCM team was consulted for assistance with disease management and care coordination needs.    Engaged with patient by telephone for initial visit in response to provider referral for pharmacy case management and/or care coordination services.   Consent to Services:  The patient was given the following information about Chronic Care Management services today, agreed to services, and gave verbal consent: 1. CCM service includes personalized support from designated clinical staff supervised by the primary care provider, including individualized plan of care and coordination with other care providers 2. 24/7 contact phone numbers for assistance for urgent and routine care needs. 3. Service will only be billed when office clinical staff spend 20 minutes or more in a month to coordinate care. 4. Only one practitioner may furnish and bill the service in a calendar month. 5.The patient may stop CCM services at any time (effective at the end of the month) by phone call to the office staff. 6. The patient will be responsible for cost sharing (co-pay) of up to 20% of the service fee (after annual deductible is met). Patient agreed to services and consent obtained.  Patient Care Team: Delsa Grana, PA-C as PCP - General (Family Medicine) Kate Sable, MD as PCP - Cardiology (Cardiology) Anabel Bene, MD as Referring Physician (Neurology) Germaine Pomfret, [facility] (Pharmacist) Vern Claude, [place] as Social Worker  Recent office visits: 07/12/2022 Delsa Grana, PA-C (PCP Office  Visit) for Follow-up- Stopped: [place] Pollen, Referral for Cardiology placed, Patient to follow-up in 3 months   07/06/2022 Delsa Grana, PA-C (PCP Office Visit) for HTN- No medication changes noted, Lab orders placed, No follow-up noted   04/05/2022 Clemetine Marker, LPN (PCP Clinical Support Visit) for Medicare Annual Wellness Exam- No medication changes noted, No orders placed,   Recent consult visits: None ID  Hospital visits: None in previous 6 months   Objective:  Lab Results  Component Value Date   CREATININE 1.10 (H) 07/12/2022   BUN 34 (H) 07/12/2022   EGFR 53 (L) 07/12/2022   GFRNONAA 47 (L) 08/27/2020   GFRAA 54 (L) 08/27/2020   NA 143 07/12/2022   K 4.0 07/12/2022   CALCIUM 9.5 07/12/2022   CO2 27 07/12/2022   GLUCOSE 97 07/12/2022    No results found for: "HGBA1C", "FRUCTOSAMINE", "GFR", "MICROALBUR"  Last diabetic Eye exam: No results found for: "HMDIABEYEEXA"  Last diabetic Foot exam: No results found for: "HMDIABFOOTEX"   Lab Results  Component Value Date   CHOL 249 (H) 07/12/2022   HDL 48 (L) 07/12/2022   LDLCALC 170 (H) 07/12/2022   TRIG 161 (H) 07/12/2022   CHOLHDL 5.2 (H) 07/12/2022       Latest Ref Rng & Units 07/12/2022   10:46 AM 05/31/2021    2:27 PM 08/27/2020    2:16 PM  Hepatic Function  Total Protein 6.1 - 8.1 g/dL 6.5  6.6  7.1   AST 10 - 35 U/L 19  24  35   ALT 6 - 29 U/L 26  36  40   Total Bilirubin 0.2 - 1.2 mg/dL 0.3  0.4  0.5     Lab Results  Component Value Date/Time   TSH 1.32 09/17/2018 04:17 PM       Latest Ref Rng & Units 07/12/2022   10:46 AM 05/31/2021    2:27 PM 08/27/2020    2:16 PM  CBC  WBC 3.8 - 10.8 Thousand/uL 5.8  6.3  6.2   Hemoglobin 11.7 - 15.5 g/dL 13.2  13.1  14.2   Hematocrit 35.0 - 45.0 % 39.1  39.4  42.8   Platelets 140 - 400 Thousand/uL 220  228  258     Lab Results  Component Value Date/Time   VD25OH 42 09/17/2018 04:17 PM    Clinical ASCVD: Yes  The ASCVD Risk score (Arnett DK, et al.,  2019) failed to calculate for the following reasons:   The patient has a prior MI or stroke diagnosis       08/03/2022    1:31 PM 07/12/2022    2:36 PM 04/05/2022    2:20 PM  Depression screen PHQ 2/9  Decreased Interest 0 3 0  Down, Depressed, Hopeless 1 3 0  PHQ - 2 Score 1 6 0  Altered sleeping  3   Tired, decreased energy  3   Change in appetite  3   Feeling bad or failure about yourself   0   Trouble concentrating  0   Moving slowly or fidgety/restless  0   Suicidal thoughts  0   PHQ-9 Score  15   Difficult doing work/chores  Very difficult     Social History   Tobacco Use  Smoking Status Former   Types: Cigarettes   Quit date: 1970   Years since quitting: 53.8  Smokeless Tobacco Never   BP Readings from Last 3 Encounters:  07/12/22 132/74  06/14/21 120/80  05/31/21 122/78   Pulse Readings from Last 3 Encounters:  07/12/22 68  05/31/21 66  09/10/20 60   Wt Readings from Last 3 Encounters:  07/12/22 161 lb 14.4 oz (73.4 kg)  06/14/21 161 lb (73 kg)  05/31/21 164 lb 1.6 oz (74.4 kg)   BMI Readings from Last 3 Encounters:  07/12/22 31.62 kg/m  06/14/21 31.44 kg/m  05/31/21 32.05 kg/m    Assessment/Interventions: Review of patient past medical history, allergies, medications, health status, including review of consultants reports, laboratory and other test data, was performed as part of comprehensive evaluation and provision of chronic care management services.   SDOH:  (Social Determinants of Health) assessments and interventions performed: Yes SDOH Interventions    Flowsheet Row Care Coordination from 08/03/2022 in [place] Coordination Office Visit from 07/12/2022 in [place] from 04/05/2022 in [place] from 04/01/2021 in [place] Interventions Intervention Not Indicated -- Intervention  Not Indicated --  Housing Interventions Intervention Not Indicated -- Intervention Not Indicated --  Transportation Interventions Intervention Not Indicated -- Intervention Not Indicated --  Depression Interventions/Treatment  -- Referral to Psychiatry -- Currently on Treatment, Medication  Financial Strain Interventions -- -- Intervention Not Indicated --  Physical Activity Interventions -- -- Intervention Not Indicated --  Stress Interventions -- -- Intervention Not Indicated --  Social Connections Interventions -- -- Intervention Not Indicated --      SDOH Screenings   Food Insecurity: No Food Insecurity (08/03/2022)  Housing: Low Risk  (08/03/2022)  Transportation Needs: No Transportation Needs (08/03/2022)  Alcohol Screen: Low  Risk  (04/05/2022)  Depression (PHQ2-9): Low Risk  (08/03/2022)  Recent Concern: Depression (PHQ2-9) - High Risk (07/12/2022)  Financial Resource Strain: Low Risk  (04/05/2022)  Physical Activity: Sufficiently Active (04/05/2022)  Social Connections: Socially Isolated (04/05/2022)  Stress: No Stress Concern Present (04/05/2022)  Tobacco Use: Medium Risk (07/12/2022)    CCM Care Plan  Allergies  Allergen Reactions   Penicillins Swelling and Rash        Statins Shortness Of Breath    And chest pain   Aspirin Other (See Comments)    Tongue swelling    Medications Reviewed Today     Reviewed by Delsa Grana, PA-C (Physician Assistant Certified) on 14/78/29 at [place] List Status: <None>   Medication Order Taking? Sig Documenting Provider Last Dose Status Informant  albuterol (VENTOLIN HFA) 108 (90 Base) MCG/ACT inhaler [id number] Yes INHALE 2 PUFFS INTO THE LUNGS EVERY 4 HOURS AS NEEDED FOR WHEEZE OR FOR SHORTNESS OF BREATH Delsa Grana, PA-C Taking Active   aspirin EC 81 MG tablet [id number] Yes Take 81 mg by mouth 2 (two) times daily. [provider] Taking Active   atenolol (TENORMIN) 50 MG tablet [id number]  Take 1 tablet (50 mg total) by mouth daily.  Delsa Grana, PA-C  Active   Cholecalciferol (VITAMIN D) 2000 units tablet [id number] Yes Take 2,000 Units by mouth daily. [provider] Taking Active   Cyanocobalamin (VITAMIN B-12) 5000 MCG SUBL [id number] Yes Place 1 tablet under the tongue 3 (three) times a week. [provider] Taking Active   ezetimibe (ZETIA) 10 MG tablet [id number] Yes Take 1 tablet (10 mg total) by mouth daily. Delsa Grana, PA-C Taking Active   fexofenadine (ALLEGRA) 180 MG tablet [id number] Yes TAKE 1 TABLET BY MOUTH EVERY DAY Delsa Grana, PA-C Taking Active   FLUoxetine (PROZAC) 20 MG tablet [id number]  Take 1.5 tablets (30 mg total) by mouth daily. Delsa Grana, PA-C  Active   fluticasone (FLONASE) 50 MCG/ACT nasal spray [id number] Yes USE 2 SPRAYS IN EACH NOSTRIL ONCE DAILY Delsa Grana, PA-C Taking Active   Fluticasone-Umeclidin-Vilant (TRELEGY ELLIPTA) 100-62.5-25 MCG/INH AEPB [id number] Yes Inhale 1 puff into the lungs daily. Delsa Grana, PA-C Taking Active   GARLIC OIL PO [id number] Yes Take 1 pg/oz/day by mouth once. [provider] Taking Active   hydrochlorothiazide (HYDRODIURIL) 25 MG tablet [id number] Yes TAKE 1 TABLET BY MOUTH EVERY DAY Delsa Grana, PA-C Taking Active   meclizine (ANTIVERT) 25 MG tablet [id number] Yes Take 1 tablet (25 mg total) by mouth 3 (three) times daily as needed for dizziness. Delsa Grana, PA-C Taking Active   Milk Thistle 250 MG CAPS [id number] Yes Take 1 capsule by mouth once. [provider] Taking Active   Misc Natural Products (DANDELION ROOT PO) [id number] Yes Take 1 tablet by mouth once. [provider] Taking Active   montelukast (SINGULAIR) 10 MG tablet [id number] Yes TAKE 1 TABLET BY MOUTH EVERYDAY AT BEDTIME Mecum, Erin E, PA-C Taking Active   Multiple Vitamins-Minerals (MULTIVITAMIN WOMENS 50+ ADV PO) [id number] Yes Take by mouth. [provider] Taking Active   nitroGLYCERIN (NITROSTAT) 0.4 MG SL tablet [id number] Yes TAKE 1 TAB  UNDER THE TONGUE EVERY 5 MIN AS NEEDED FOR CHEST PAIN. CALL 911. MAX 3 PER EPISODE Delsa Grana, PA-C Taking Active   OVER THE COUNTER MEDICATION [id number] Yes Cream of tartar 3 times per month [provider] Taking Active   TURMERIC PO [id number] Yes Take 1 tablet by mouth once. [provider] Taking  Active             Patient Active Problem List   Diagnosis Date Noted   S/P abdominal hysterectomy 06/14/2021   Coronary artery disease involving native heart 05/31/2021   Current mild episode of major depressive disorder, unspecified whether recurrent ([place]) 05/31/2021   Benign neoplasm of transverse colon    Benign neoplasm of ascending colon    Cervical radiculopathy 12/22/2017   GAD (generalized anxiety disorder) 09/30/2016   Moderate persistent asthma without complication 87/68/1157   Apnea, sleep 04/11/2016   Osteoporosis, 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   Primary hypertension 04/11/2016   Hyperlipidemia 04/11/2016   Depression 04/11/2016   Hx of myocardial infarction 03/24/2016   Hx of hepatitis C 03/24/2016   Hx of completed stroke 03/24/2016   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   Headache disorder 08/06/2014    Immunization History  Administered Date(s) Administered   Tdap 09/25/2018    Conditions to be addressed/monitored:  Hypertension, Hyperlipidemia, Coronary Artery Disease, Asthma, Depression, Anxiety, Osteoporosis, and History of MI, Stroke  There are no care plans that you recently modified to display for this patient.    Medication Assistance: {MEDASSISTANCEINFO:25044}  Compliance/Adherence/Medication fill history: Care Gaps: ***  Star-Rating Drugs: ***  Patient[facility]  [id number] [id number]: [phone]: [phone] [id number] [place]: [phone]:  [phone] pill box? {Yes or If no, why not?:20788} Pt endorses ***% compliance  We discussed: {Pharmacy options:24294} Patient decided to: {US Pharmacy Plan:23885}  Care Plan and Follow Up Patient Decision:  {FOLLOWUP:24991}  Plan: {CM FOLLOW UP PLZPHX:50569}***  Current Barriers:  {pharmacybarriers:24917}  Pharmacist Clinical Goal(s):  Patient will {PHARMACYGOALCHOICES:24921} through collaboration with PharmD and provider.   Interventions: 1:1 collaboration with TaDelsa GranaPA-C regarding development and update of comprehensive plan of care as evidenced by provider attestation and co-signature Inter-disciplinary care team collaboration (see longitudinal plan of care) Comprehensive medication review performed; medication list updated in electronic medical record  Hypertension (BP goal {CHL HP UPSTREAM Pharmacist BP ranges:[phone]}) -{US controlled/uncontrolled:25276} -Current treatment: Aetnolol 50 mg daily  HCTZ 25 mg daily  -Medications previously tried: ***  -Current home readings: *** -Current dietary habits: *** -Current exercise habits: *** -{ACTIONS;DENIES/REPORTS:21021675::"Denies"} hypotensive/hypertensive symptoms -Educated on {CCM BP Counseling:25124} -Counseled to monitor BP at home ***, document, and provide log at future appointments -{CCMPHARMDINTERVENTION:25122}  Hyperlipidemia: (LDL goal < 70) -{US controlled/uncontrolled:25276} -2-3 MI's in 1980s-90s.  -Current treatment: Ezetimibe 10 mg daily  -Medications previously tried: Aspirin (swelling), statins   -Current dietary patterns: *** -Current exercise habits: *** -Educated on {CCM HLD Counseling:25126} -{CCMPHARMDINTERVENTION:25122}  Asthma (Goal: control symptoms and prevent exacerbations) -{US controlled/uncontrolled:25276} -Current treatment  Albuterol HFA  Trelegy 1 puff daily  Montelukast 10 mg daily  -Current allergy treatment  Fexofenadine 180 mg daily  Flonase 2 sprays  daily  -Medications previously tried: ***  -Gold Grade: {CHL HP Upstream Pharm COPD Gold GrVXYIA:[id number]}Current COPD Classification:  {CHL HP Upstream Pharm COPD Classification:[phone]} -MMRC/CAT score: *** -Pulmonary function testing: *** -Exacerbations requiring treatment in last 6 months: *** -Patient {Actions; denies-reports:120008} consistent use of maintenance inhaler -Frequency of rescue inhaler use: *** -Counseled on {CCMINHALERCOUNSELING:25121} -{CCMPHARMDINTERVENTION:25122}  Depression/Anxiety (Goal: ***) -{US controlled/uncontrolled:25276} -Current treatment: Fluoxetine 20 mg daily  -Medications previously tried/failed: *** -PHQ9: *** -GAD7: *** -Connected with *** for mental health support -Educated on {  CCM mental health counseling:25127} -{CCMPHARMDINTERVENTION:25122}  Osteoporosis / Osteopenia (Goal ***) -{US controlled/uncontrolled:25276} -Last DEXA Scan: ***   T-Score femoral neck: ***  T-Score total hip: ***  T-Score lumbar spine: ***  T-Score forearm radius: ***  10-year probability of major osteoporotic fracture: ***  10-year probability of hip fracture: *** -Patient {is;is not an osteoporosis candidate:23886} -Current treatment  Vitamin D 2000 units daily  -Medications previously tried: ***  -{Osteoporosis Counseling:23892} -{CCMPHARMDINTERVENTION:25122}  Chronic Kidney Disease Stage 3a  -All medications assessed for renal dosing and appropriateness in chronic kidney disease. -{CCMPHARMDINTERVENTION:25122}   Patient Goals/Self-Care Activities Patient will:  - {pharmacypatientgoals:24919}  Follow Up Plan: {CM FOLLOW UP BDGR:24799}

## 2022-08-25 ENCOUNTER — Ambulatory Visit (INDEPENDENT_AMBULATORY_CARE_PROVIDER_SITE_OTHER): Payer: Medicare Other

## 2022-08-25 DIAGNOSIS — I10 Essential (primary) hypertension: Secondary | ICD-10-CM

## 2022-08-25 DIAGNOSIS — E785 Hyperlipidemia, unspecified: Secondary | ICD-10-CM

## 2022-08-25 DIAGNOSIS — N1831 Chronic kidney disease, stage 3a: Secondary | ICD-10-CM

## 2022-08-29 ENCOUNTER — Ambulatory Visit: Payer: Medicare Other | Admitting: Cardiology

## 2022-08-29 NOTE — Chronic Care Management (AMB) (Signed)
Chronic Care Management Provider Comprehensive Care Plan    Name: Tricia Vasquez MRN: [id number] DOB: 12-Mar-1951  Referral to Chronic Care Management (CCM) services was placed by Provider:  Delsa Grana, PA-C on Date: 07/12/22.  Interaction and coordination with outside resources, practitioners, and providers See CCM Referral  Chronic Condition 1: Hypertension Provider Assessment and Plan  HTN Managed on HCTZ and atenolol, BP at goal today, no concerns with pulse/HR    BP Readings from Last 3 Encounters:  07/12/22 132/74  06/14/21 120/80  05/31/21 122/78       Pulse Readings from Last 3 Encounters:  07/12/22 68  05/31/21 66  09/10/20 60  Not on ACEI/ARB - no  CKD - last GFR was 44 - she did consult with nephrologist but again did not f/up Did labs this am   Expected Outcome/Goals Addressed This Visit: (Provider CCM Goals)/Provider Assessment and Plan CCM (HYPERTENSION) EXPECTED OUTCOME:  MONITOR, SELF- MANAGE AND REDUCE SYMPTOMS OF HYPERTENSION   Chronic Condition 2: CKD Provider Assessment and Plan  CKD, hx stroke, hx MI, poor compliance and f/up, pt hesitant to take any Rx meds but is taking ample supplements/herbal remedy (ex eating whole garlic to get cholesterol down, drinking baking soda water to help with "inflammation")  CKD - last GFR was 44 - she did consult with nephrologist but again did not f/up Did labs this am  Expected Outcome/Goals Addressed This Visit: (Provider CCM Goals)/Provider Assessment and Plan  CCM (CHRONIC KIDNEY DISEASE) EXPECTED OUTCOME:  MONITOR, SELF- MANAGE AND REDUCE SYMPTOMS OF CHRONIC KIDNEY DISEASE   Problem List Patient Active Problem List   Diagnosis Date Noted   S/P abdominal hysterectomy 06/14/2021   Coronary artery disease involving native heart 05/31/2021   Current mild episode of major depressive disorder, unspecified whether recurrent ([place]) 05/31/2021   Benign neoplasm of transverse colon    Benign neoplasm of ascending  colon    Cervical radiculopathy 12/22/2017   GAD (generalized anxiety disorder) 09/30/2016   Moderate persistent asthma without complication 40/98/1191   Apnea, sleep 04/11/2016   Osteoporosis, 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   Primary hypertension 04/11/2016   Hyperlipidemia 04/11/2016   Depression 04/11/2016   Hx of myocardial infarction 03/24/2016   Hx of hepatitis C 03/24/2016   Hx of completed stroke 03/24/2016   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   Headache disorder 08/06/2014    Medication Management Outpatient Encounter Medications as of 08/25/2022  Medication Sig   albuterol (VENTOLIN HFA) 108 (90 Base) MCG/ACT inhaler INHALE 2 PUFFS INTO THE LUNGS EVERY 4 HOURS AS NEEDED FOR WHEEZE OR FOR SHORTNESS OF BREATH   aspirin EC 81 MG tablet Take 81 mg by mouth 2 (two) times daily.   atenolol (TENORMIN) 50 MG tablet Take 1 tablet (50 mg total) by mouth daily.   Cholecalciferol (VITAMIN D) 2000 units tablet Take 2,000 Units by mouth daily.   Cyanocobalamin (VITAMIN B-12) 5000 MCG SUBL Place 1 tablet under the tongue 3 (three) times a week.   ezetimibe (ZETIA) 10 MG tablet Take 1 tablet (10 mg total) by mouth daily.   fexofenadine (ALLEGRA) 180 MG tablet TAKE 1 TABLET BY MOUTH EVERY DAY   FLUoxetine (PROZAC) 20 MG tablet TAKE 1 TABLET BY MOUTH EVERY DAY   fluticasone (FLONASE) 50 MCG/ACT nasal spray USE 2 SPRAYS IN EACH NOSTRIL ONCE DAILY   Fluticasone-Umeclidin-Vilant (TRELEGY ELLIPTA) 100-62.5-25 MCG/INH AEPB Inhale 1 puff into the lungs daily.   GARLIC OIL PO Take 1 pg/oz/day by mouth once.   hydrochlorothiazide (  HYDRODIURIL) 25 MG tablet TAKE 1 TABLET BY MOUTH EVERY DAY   meclizine (ANTIVERT) 25 MG tablet TAKE 1 TABLET BY MOUTH 3 TIMES DAILY AS NEEDED FOR DIZZINESS.   Milk Thistle 250 MG CAPS Take 1 capsule by mouth once.   Misc Natural Products (DANDELION ROOT PO) Take 1 tablet by mouth once.   montelukast (SINGULAIR) 10 MG tablet TAKE 1 TABLET BY MOUTH EVERYDAY AT BEDTIME    Multiple Vitamins-Minerals (MULTIVITAMIN WOMENS 50+ ADV PO) Take by mouth.   nitroGLYCERIN (NITROSTAT) 0.4 MG SL tablet TAKE 1 TAB UNDER THE TONGUE EVERY 5 MIN AS NEEDED FOR CHEST PAIN. CALL 911. MAX 3 PER EPISODE   OVER THE COUNTER MEDICATION Cream of tartar 3 times per month   TURMERIC PO Take 1 tablet by mouth once.   No facility-administered encounter medications on file as of 08/25/2022.    Cognitive Assessment Identity Confirmed: '[x]'$  Name; '[x]'$  DOB Cognitive Status: '[x]'$  Normal '[]'$  Abnormal    Functional Status Survey: Is the patient deaf or have difficulty hearing?: No Does the patient have difficulty seeing, even when wearing glasses/contacts?: No Does the patient have difficulty concentrating, remembering, or making decisions?: No Does the patient have difficulty walking or climbing stairs?: No Does the patient have difficulty dressing or bathing?: No Does the patient have difficulty doing errands alone such as visiting a doctor's office or shopping?: No   Caregiver Assessment  Current Living Arrangement: home Living arrangements - Reports currently living alone Caregiver: Self Caregiver Relation:  Self Status:  Stable

## 2022-08-29 NOTE — Patient Instructions (Signed)
Thank you for allowing the Chronic Care Management team to participate in your care.  It was great speaking with you! Please do not hesitate to call if you require assistance prior to our next outreach.   Our next outreach is scheduled for September 20, 2022 at 2 pm.  Please call the care guide team at [phone] if you need to cancel or reschedule your appointment.   Following is a copy of your full provider care plan:   Goals Addressed             This Visit's Progress    CCM (CHRONIC KIDNEY DISEASE) EXPECTED OUTCOME:  MONITOR, SELF- MANAGE AND REDUCE SYMPTOMS OF CHRONIC KIDNEY DISEASE       Current Barriers:  Chronic Disease Management support and education needs related to Chronic Kidney Disease Non-adherence to prescribed medication regimen   Lab Results  Component Value Date   CREATININE 1.10 (H) 07/12/2022    Lab Results  Component Value Date   BUN 34 (H) 07/12/2022     Planned Interventions: Assessed the patient's understanding of chronic kidney disease    Evaluation of current treatment plan related to chronic kidney disease self management and patient's adherence to plan as established by provider      Provided education on kidney disease progression    Provided education to patient re: stroke prevention, s/s of heart attack and stroke    Reviewed prescribed diet Reviewed medications with patient and discussed importance of compliance. Expressed concerns that she is usually alone and may experience side effects. She prefers not to start the new medication until everything is settled. Advised to take as prescribed and notify PCP if unable to tolerate. Screening for signs and symptoms of depression related to chronic disease state     Reviewed scheduled/upcoming provider appointments. Advised to attend appointments as scheduled to avoid delays in care.    Symptom Management: Take medications as prescribed   Attend all scheduled provider appointments Call provider  office for new concerns or questions  Notify PCP if experiencing side effects with new medication  Follow Up Plan:  Will follow up in November       CCM Expected Outcome:  Monitor, Self-Manage, and Reduce Symptoms of Hypertension       Current Barriers:  Chronic Disease Management support and education needs related to Hypertension Management  Planned Interventions: Reviewed medications and indications for use. Reports taking BP medications as prescribed.  Provided information regarding established blood pressure parameters along with indications for notifying a provider. Reports monitoring BP at home. Reports readings have been within range. Advised to monitor consistently and record readings.  Reviewed recent cardiac symptoms. Patient with hx of MI and stroke. Denies recent episodes of chest discomfort, palpitations, or shortness of breath. Denies recent episodes of headaches, dizziness of visual changes. Cholesterol/Lipid panel is not within goal. Patient reports not starting cholesterol medications as prescribed. Reports currently being busy moving items from her late sister's home. Expressed concerns that she is usually alone and may experience side effects. She prefers not to start the new medication until everything is settled. Advised to take as prescribed and notify PCP if unable to tolerate. Discussed activity tolerance. Reports engaging in exercise and yoga regularly. Reports tolerating mild and moderate activity without complications.  Discussed compliance with recommended cardiac prudent diet. Encouraged to read nutrition labels, monitor sodium intake, and avoid highly processed foods when possible. Discussed current cholesterol levels and need to decrease intake of foods high in cholesterol and  saturated fats. Discussed complications of uncontrolled blood pressure.  Reviewed s/sx of heart attack, stroke and worsening symptoms that require immediate medical attention.   Symptom  Management/Patient Goals: Check blood pressure daily Keep a blood pressure log Take blood pressure log to all doctor appointments Notify provider for blood pressure readings outside of established range Take medications for blood pressure exactly as prescribed Continue exercise program Report new symptoms to your doctor Continue reading nutrition labels Limit intake of sodium, cholesterol and saturated fats Attend doctor appointments as scheduled  Follow Up Plan:  Will follow up in November          Tricia Vasquez verbalized understanding of instructions, educational materials, and care plan provided today. Declined offer to receive copy of patient instructions, educational materials, and care plan.   A member of the care management team will follow up in November.  Tricia Latino RN Care Manager/Chronic Care Management [phone]

## 2022-08-29 NOTE — Chronic Care Management (AMB) (Signed)
CCM RN Visit Note    Name: Astoria Condon MRN: [id number]      DOB: [birth year]  Subjective: Tricia Vasquez is a 71 y.o. year old female who is a primary care patient of Delsa Grana, [place]. The patient was referred to the Chronic Care Management team for assistance with care management needs subsequent to provider initiation of CCM services and plan of care.      Today's Visit: Engaged with patient by telephone for initial visit.   SDOH Interventions Today    Flowsheet Row Most Recent Value  SDOH Interventions   Food Insecurity Interventions Intervention Not Indicated  Housing Interventions Intervention Not Indicated  Transportation Interventions Intervention Not Indicated  Utilities Interventions Intervention Not Indicated  Alcohol Usage Interventions Intervention Not Indicated (Score <7)  Depression Interventions/Treatment  Counseling  [Previously referred to Psychiatry]        Goals Addressed             This Visit's Progress    CCM (CHRONIC KIDNEY DISEASE) EXPECTED OUTCOME:  MONITOR, SELF- MANAGE AND REDUCE SYMPTOMS OF CHRONIC KIDNEY DISEASE       Current Barriers:  Chronic Disease Management support and education needs related to Chronic Kidney Disease Non-adherence to prescribed medication regimen   Lab Results  Component Value Date   CREATININE 1.10 (H) 07/12/2022    Lab Results  Component Value Date   BUN 34 (H) 07/12/2022     Planned Interventions: Assessed the patient's understanding of chronic kidney disease    Evaluation of current treatment plan related to chronic kidney disease self management and patient's adherence to plan as established by provider      Provided education on kidney disease progression    Provided education to patient re: stroke prevention, s/s of heart attack and stroke    Reviewed prescribed diet Reviewed medications with patient and discussed importance of compliance. Expressed concerns that she is usually alone and may  experience side effects. She prefers not to start the new medication until everything is settled. Advised to take as prescribed and notify PCP if unable to tolerate. Screening for signs and symptoms of depression related to chronic disease state     Reviewed scheduled/upcoming provider appointments. Advised to attend appointments as scheduled to avoid delays in care.    Symptom Management: Take medications as prescribed   Attend all scheduled provider appointments Call provider office for new concerns or questions  Notify PCP if experiencing side effects with new medication  Follow Up Plan:  Will follow up in November       CCM Expected Outcome:  Monitor, Self-Manage, and Reduce Symptoms of Hypertension       Current Barriers:  Chronic Disease Management support and education needs related to Hypertension Management  Planned Interventions: Reviewed medications and indications for use. Reports taking BP medications as prescribed.  Provided information regarding established blood pressure parameters along with indications for notifying a provider. Reports monitoring BP at home. Reports readings have been within range. Advised to monitor consistently and record readings.  Reviewed recent cardiac symptoms. Patient with hx of MI and stroke. Denies recent episodes of chest discomfort, palpitations, or shortness of breath. Denies recent episodes of headaches, dizziness of visual changes. Cholesterol/Lipid panel is not within goal. Patient reports not starting cholesterol medications as prescribed. Reports currently being busy moving items from her late sister's home. Expressed concerns that she is usually alone and may experience side effects. She prefers not to start the new medication until everything is  settled. Advised to take as prescribed and notify PCP if unable to tolerate. Discussed activity tolerance. Reports engaging in exercise and yoga regularly. Reports tolerating mild and moderate  activity without complications.  Discussed compliance with recommended cardiac prudent diet. Encouraged to read nutrition labels, monitor sodium intake, and avoid highly processed foods when possible. Discussed current cholesterol levels and need to decrease intake of foods high in cholesterol and saturated fats. Discussed complications of uncontrolled blood pressure.  Reviewed s/sx of heart attack, stroke and worsening symptoms that require immediate medical attention.   Symptom Management/Patient Goals: Check blood pressure daily Keep a blood pressure log Take blood pressure log to all doctor appointments Notify provider for blood pressure readings outside of established range Take medications for blood pressure exactly as prescribed Continue exercise program Report new symptoms to your doctor Continue reading nutrition labels Limit intake of sodium, cholesterol and saturated fats Attend doctor appointments as scheduled  Follow Up Plan:  Will follow up in November          PLAN A member of the care management team will follow up in November.   [name] RN Care Manager/Chronic Care Management [phone]

## 2022-08-30 DIAGNOSIS — N1831 Chronic kidney disease, stage 3a: Secondary | ICD-10-CM | POA: Diagnosis not present

## 2022-08-30 DIAGNOSIS — I129 Hypertensive chronic kidney disease with stage 1 through stage 4 chronic kidney disease, or unspecified chronic kidney disease: Secondary | ICD-10-CM

## 2022-09-01 ENCOUNTER — Ambulatory Visit: Payer: Self-pay | Admitting: *Deleted

## 2022-09-01 NOTE — Patient Instructions (Signed)
Visit Information  Thank you for taking time to visit with me today. Please don't hesitate to contact me if I can be of assistance to you.   Following are the goals we discussed today:   Goals Addressed             This Visit's Progress    Mental Health Resources       Care Coordination Interventions: Able to get things from sisters home , Phone call to patient to provide mental health support and to provide resources Patient continuing to  settle her affairs-working on cleaning out family home Continued encouragement to prioritize herself-contact information for [facility] provided for ongoing follow up [phone]  Patient agreeable to calling [facility] for counseling once her families estate is settled Active listening / Reflection utilized  Engineer, petroleum Provided Participation in counseling encouraged  Patient agreeable to mental health follow up and will call to schedule appointment           If you are experiencing a Mental Health or [place] or need someone to talk to, please call the Suicide and Crisis Lifeline: 988 call 911   Patient verbalizes understanding of instructions and care plan provided today and agrees to view in [place]. Active MyChart status and patient understanding of how to access instructions and care plan via MyChart confirmed with patient.     No further follow up required: patient agreeable to contacting [facility] to schedule her initial intake appointment for mental health counseling  Elliot Gurney, [facility] Care Management [phone]

## 2022-09-01 NOTE — Patient Outreach (Signed)
  Care Coordination   Follow Up Visit Note   09/01/2022 Name: Kalene Cutler MRN: [id number] DOB: 1951-06-09  Terin Dierolf is a 71 y.o. year old female who sees Delsa Grana, [place] for primary care. I spoke with  [name] by phone today.  What matters to the patients health and wellness today?  Mental Health Resources    Goals Addressed             This Visit's Progress    Mental Health Resources       Care Coordination Interventions: Able to get things from sisters home , Phone call to patient to provide mental health support and to provide resources Patient continuing to  settle her affairs-working on cleaning out family home Continued encouragement to prioritize herself-contact information for [facility] provided for ongoing follow up [phone]  Patient agreeable to calling [facility] for counseling once her families estate is settled Active listening / Reflection utilized  Engineer, petroleum Provided Participation in counseling encouraged  Patient agreeable to mental health follow up and will call to schedule appointment         SDOH assessments and interventions completed:  No     Care Coordination Interventions Activated:  Yes  Care Coordination Interventions:  Yes, provided   Follow up plan: No further intervention required.   Encounter Outcome:  Pt. Visit Completed

## 2022-09-16 ENCOUNTER — Ambulatory Visit: Payer: Medicare Other | Admitting: Cardiology

## 2022-09-20 ENCOUNTER — Telehealth: Payer: Federal, State, Local not specified - PPO

## 2022-10-10 ENCOUNTER — Other Ambulatory Visit: Payer: Self-pay | Admitting: Family Medicine

## 2022-10-10 DIAGNOSIS — J45901 Unspecified asthma with (acute) exacerbation: Secondary | ICD-10-CM

## 2022-10-10 DIAGNOSIS — J302 Other seasonal allergic rhinitis: Secondary | ICD-10-CM

## 2022-10-11 ENCOUNTER — Ambulatory Visit (INDEPENDENT_AMBULATORY_CARE_PROVIDER_SITE_OTHER): Payer: Medicare Other | Admitting: Family Medicine

## 2022-10-11 ENCOUNTER — Ambulatory Visit: Payer: Medicare Other | Admitting: Cardiology

## 2022-10-11 ENCOUNTER — Encounter: Payer: Self-pay | Admitting: Family Medicine

## 2022-10-11 VITALS — BP 130/74 | HR 68 | Temp 98.2°F | Resp 16 | Ht 60.0 in | Wt 158.3 lb

## 2022-10-11 DIAGNOSIS — F320 Major depressive disorder, single episode, mild: Secondary | ICD-10-CM | POA: Diagnosis not present

## 2022-10-11 DIAGNOSIS — E785 Hyperlipidemia, unspecified: Secondary | ICD-10-CM

## 2022-10-11 DIAGNOSIS — Z5181 Encounter for therapeutic drug level monitoring: Secondary | ICD-10-CM

## 2022-10-11 DIAGNOSIS — N1831 Chronic kidney disease, stage 3a: Secondary | ICD-10-CM

## 2022-10-11 DIAGNOSIS — I2510 Atherosclerotic heart disease of native coronary artery without angina pectoris: Secondary | ICD-10-CM

## 2022-10-11 DIAGNOSIS — I10 Essential (primary) hypertension: Secondary | ICD-10-CM | POA: Diagnosis not present

## 2022-10-11 DIAGNOSIS — F411 Generalized anxiety disorder: Secondary | ICD-10-CM

## 2022-10-11 DIAGNOSIS — J4540 Moderate persistent asthma, uncomplicated: Secondary | ICD-10-CM

## 2022-10-11 DIAGNOSIS — I252 Old myocardial infarction: Secondary | ICD-10-CM

## 2022-10-11 MED ORDER — BUDESONIDE-FORMOTEROL FUMARATE 160-4.5 MCG/ACT IN AERO: 2.0000 | INHALATION_SPRAY | Freq: Two times a day (BID) | RESPIRATORY_TRACT | 3 refills | Status: DC

## 2022-10-11 NOTE — Assessment & Plan Note (Signed)
She has not used Trelegy for about a year she endorses needing albuterol rescue inhaler pretty often particularly with exposure to outdoors or much worse symptoms during spring season Discussed the transition of asthma management to Symbicort for rescue, since she has poor compliance to any maintenance inhalers we can try Symbicort as maintenance and rescue inhaler encouraged her to stay on her allergy and asthma controller medicine such as the antihistamines and Singulair may be helpful

## 2022-10-11 NOTE — Assessment & Plan Note (Signed)
She has cardiology appointment in 2 days to establish locally

## 2022-10-11 NOTE — Assessment & Plan Note (Signed)
Same as below continue Prozac 20 mg daily

## 2022-10-11 NOTE — Assessment & Plan Note (Signed)
PHQ-9 is minimally positive with a score of 5 she reports feeling really good right now due to the resolution of some significant stressors in her life she would like to continue Prozac 20 mg daily no side effects or concerns with the medicines and she is talking with someone regularly to help with working through stress and finding coping mechanisms and resources

## 2022-10-11 NOTE — Progress Notes (Signed)
Name: Tricia Vasquez   MRN: [id number]    DOB: 02-Aug-1951   Date:10/11/2022       Progress Note  Chief Complaint  Patient presents with   Follow-up   Hyperlipidemia   Hypertension   Depression   Anxiety     Subjective:   Tricia Vasquez is a 71 y.o. female, presents to clinic for f/up on chronic conditions  HLD statin intolerance - not taking zetia, statin intolerant - she is hoping to discuss with cardiology which she will see in 2 days  Hypertension:  discussed changing to ACEI/ARB with HCTZ Currently managed on HCTZ and BB Pt reports good med compliance and denies any SE.   Blood pressure today is well controlled. BP Readings from Last 3 Encounters:  10/11/22 130/74  07/12/22 132/74  06/14/21 120/80   Pt denies CP, SOB, exertional sx, LE edema, palpitation, Ha's, visual disturbances, lightheadedness, hypotension, syncope.   CAD with prior multiple MI's larger "scar on heart" no past PCI - due to est with cardiology, not currently having any exertional sx  Anxiety/stress: Working with CCM SW/therapy     10/11/2022    2:59 PM 08/25/2022    2:30 PM 08/03/2022    1:31 PM  Depression screen PHQ 2/9  Decreased Interest 1 0 0  Down, Depressed, Hopeless '1 1 1  '$ PHQ - 2 Score '2 1 1  '$ Altered sleeping 0    Tired, decreased energy 2    Change in appetite 0    Feeling bad or failure about yourself  1    Trouble concentrating 0    Moving slowly or fidgety/restless 0    Suicidal thoughts 0    PHQ-9 Score 5    Difficult doing work/chores Somewhat difficult    Lowered prozac dose to 20 and she feels good dispite stressors with move and calling cops on neighbors  Asthma - not using trelegy, but using rescue inhaler -outdoor or morning exposure to weather can cause her more respiratory symptoms and she has much worse asthma seasonally she is compliant with her Allegra and sometimes uses the Flonase No recent asthma exacerbations  Frequent vertigo episodes she reports are  well-controlled with meclizine she has been told in the past that she possibly has Mnire's She does feel her ears are blocked sometimes I would like her ear checked today   Current Outpatient Medications:    albuterol (VENTOLIN HFA) 108 (90 Base) MCG/ACT inhaler, INHALE 2 PUFFS INTO THE LUNGS EVERY 4 HOURS AS NEEDED FOR WHEEZE OR FOR SHORTNESS OF BREATH, Disp: 6.7 each, Rfl: 5   aspirin EC 81 MG tablet, Take 81 mg by mouth 2 (two) times daily., Disp: , Rfl:    atenolol (TENORMIN) 50 MG tablet, Take 1 tablet (50 mg total) by mouth daily., Disp: 90 tablet, Rfl: 1   Cholecalciferol (VITAMIN D) 2000 units tablet, Take 2,000 Units by mouth daily., Disp: , Rfl:    Cyanocobalamin (VITAMIN B-12) 5000 MCG SUBL, Place 1 tablet under the tongue 3 (three) times a week., Disp: , Rfl:    ezetimibe (ZETIA) 10 MG tablet, Take 1 tablet (10 mg total) by mouth daily., Disp: 90 tablet, Rfl: 3   fexofenadine (ALLEGRA) 180 MG tablet, TAKE 1 TABLET BY MOUTH EVERY DAY, Disp: 90 tablet, Rfl: 3   FLUoxetine (PROZAC) 20 MG tablet, TAKE 1 TABLET BY MOUTH EVERY DAY, Disp: 90 tablet, Rfl: 0   fluticasone (FLONASE) 50 MCG/ACT nasal spray, USE 2 SPRAYS IN EACH NOSTRIL ONCE DAILY, Disp:  48 mL, Rfl: 3   GARLIC OIL PO, Take 1 pg/oz/day by mouth once., Disp: , Rfl:    hydrochlorothiazide (HYDRODIURIL) 25 MG tablet, TAKE 1 TABLET BY MOUTH EVERY DAY, Disp: 90 tablet, Rfl: 3   meclizine (ANTIVERT) 25 MG tablet, TAKE 1 TABLET BY MOUTH 3 TIMES DAILY AS NEEDED FOR DIZZINESS., Disp: 60 tablet, Rfl: 5   Milk Thistle 250 MG CAPS, Take 1 capsule by mouth once., Disp: , Rfl:    Misc Natural Products (DANDELION ROOT PO), Take 1 tablet by mouth once., Disp: , Rfl:    montelukast (SINGULAIR) 10 MG tablet, TAKE 1 TABLET BY MOUTH EVERYDAY AT BEDTIME, Disp: 90 tablet, Rfl: 0   Multiple Vitamins-Minerals (MULTIVITAMIN WOMENS 50+ ADV PO), Take by mouth., Disp: , Rfl:    nitroGLYCERIN (NITROSTAT) 0.4 MG SL tablet, TAKE 1 TAB UNDER THE TONGUE EVERY  5 MIN AS NEEDED FOR CHEST PAIN. CALL 911. MAX 3 PER EPISODE, Disp: 25 tablet, Rfl: 0   OVER THE COUNTER MEDICATION, Cream of tartar 3 times per month, Disp: , Rfl:    TURMERIC PO, Take 1 tablet by mouth once., Disp: , Rfl:    Fluticasone-Umeclidin-Vilant (TRELEGY ELLIPTA) 100-62.5-25 MCG/INH AEPB, Inhale 1 puff into the lungs daily. (Patient not taking: Reported on 10/11/2022), Disp: 28 each, Rfl: 11  Patient Active Problem List   Diagnosis Date Noted   S/P abdominal hysterectomy 06/14/2021   Coronary artery disease involving native heart 05/31/2021   Current mild episode of major depressive disorder, unspecified whether recurrent ([place]) 05/31/2021   Benign neoplasm of transverse colon    Benign neoplasm of ascending colon    Cervical radiculopathy 12/22/2017   GAD (generalized anxiety disorder) 09/30/2016   Moderate persistent asthma without complication 17/79/3903   Apnea, sleep 04/11/2016   Osteoporosis, 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   Primary hypertension 04/11/2016   Hyperlipidemia 04/11/2016   Depression 04/11/2016   Hx of myocardial infarction 03/24/2016   Hx of hepatitis C 03/24/2016   Hx of completed stroke 03/24/2016   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   Headache disorder 08/06/2014    Past Surgical History:  Procedure Laterality Date   ABDOMINAL HYSTERECTOMY     COLON SURGERY     COLONOSCOPY WITH PROPOFOL N/A 10/04/2018   Procedure: COLONOSCOPY WITH PROPOFOL;  Surgeon: Lucilla Lame, MD;  Location: [place];  Service: Endoscopy;  Laterality: N/A;  sleep apnea   OVARIAN CYST REMOVAL     POLYPECTOMY  10/04/2018   Procedure: POLYPECTOMY;  Surgeon: Lucilla Lame, MD;  Location: [place];  Service: Endoscopy;;   TONSILLECTOMY      Family History  Problem Relation Age of Onset   Hypertension Mother    Hypertension Father    Hypertension Sister    Hypertension Brother     Social History   Tobacco Use   Smoking status: Former    Types:  Cigarettes    Quit date: 1970    Years since quitting: 53.9   Smokeless tobacco: Never  Vaping Use   Vaping Use: Never used  Substance Use Topics   Alcohol use: No    Alcohol/week: 0.0 standard drinks of alcohol    Comment: may have a drink 1x/mo   Drug use: No     Allergies  Allergen Reactions   Penicillins Swelling and Rash        Statins Shortness Of Breath    And chest pain   Aspirin Other (See Comments)    Tongue swelling    Health  Maintenance  Topic Date Due   MAMMOGRAM  Never done   DEXA SCAN  Never done   INFLUENZA VACCINE  01/29/2023 (Originally 05/31/2022)   Medicare Annual Wellness (AWV)  04/06/2023   COLONOSCOPY (Pts 45-46yr Insurance coverage will need to be confirmed)  10/05/2023   DTaP/Tdap/Td (2 - Td or Tdap) 09/25/2028   Hepatitis C Screening  Completed   HPV VACCINES  Aged Out   Pneumonia Vaccine 72 Years old  Discontinued   COVID-19 Vaccine  Discontinued   Zoster Vaccines- Shingrix  Discontinued    Chart Review Today: I personally reviewed active problem list, medication list, allergies, family history, social history, health maintenance, notes from last encounter, lab results, imaging with the patient/caregiver today.   Review of Systems  Constitutional: Negative.   HENT: Negative.    Eyes: Negative.   Respiratory: Negative.    Cardiovascular: Negative.   Gastrointestinal: Negative.   Endocrine: Negative.   Genitourinary: Negative.   Musculoskeletal: Negative.   Skin: Negative.   Allergic/Immunologic: Negative.   Neurological: Negative.   Hematological: Negative.   Psychiatric/Behavioral: Negative.    All other systems reviewed and are negative.    Objective:   Vitals:   10/11/22 1459  BP: 130/74  Pulse: 68  Resp: 16  Temp: 98.2 F (36.8 C)  TempSrc: Oral  SpO2: 98%  Weight: 158 lb 4.8 oz (71.8 kg)  Height: 5' (1.524 m)    Body mass index is 30.92 kg/m.  Physical Exam Vitals and nursing note reviewed.  Constitutional:       General: She is not in acute distress.    Appearance: Normal appearance. She is well-developed. She is not ill-appearing, toxic-appearing or diaphoretic.  HENT:     Head: Normocephalic and atraumatic.     Right Ear: Tympanic membrane, ear canal and external ear normal. There is no impacted cerumen.     Left Ear: Tympanic membrane, ear canal and external ear normal. There is no impacted cerumen.     Nose: Congestion and rhinorrhea present.     Mouth/Throat:     Mouth: Mucous membranes are moist.     Pharynx: Oropharynx is clear. No oropharyngeal exudate or posterior oropharyngeal erythema.  Eyes:     General: No scleral icterus.       Right eye: No discharge.        Left eye: No discharge.     Conjunctiva/sclera: Conjunctivae normal.  Neck:     Trachea: No tracheal deviation.  Cardiovascular:     Rate and Rhythm: Normal rate and regular rhythm.     Pulses: Normal pulses.     Heart sounds: Normal heart sounds.  Pulmonary:     Effort: Pulmonary effort is normal. No respiratory distress.     Breath sounds: Normal breath sounds. No stridor. No wheezing, rhonchi or rales.  Abdominal:     General: Bowel sounds are normal. There is no distension.     Palpations: Abdomen is soft.     Tenderness: There is no abdominal tenderness.  Musculoskeletal:     Cervical back: Normal range of motion.  Lymphadenopathy:     Cervical: No cervical adenopathy.  Skin:    General: Skin is warm and dry.     Coloration: Skin is not jaundiced or pale.     Findings: No rash.  Neurological:     Mental Status: She is alert. Mental status is at baseline.     Motor: No abnormal muscle tone.     Coordination: Coordination normal.  Psychiatric:        Mood and Affect: Mood normal.        Behavior: Behavior normal.         Assessment & Plan:   Problem List Items Addressed This Visit       Cardiovascular and Mediastinum   Primary hypertension - Primary    Well-controlled today on her current  medications but I would like to change them to give some renal protection with CKD She is meeting with cardiology in about 2 days I will wait to see what they advise but it may be beneficial to have her on something like lisinopril-HCTZ 20-12.5 and reduce HCTZ from 25, she has no significant LE edema - I think she would do well with this adjustment - will discuss with cardiology Advised pt I will get back to her about what cardiology says and the med changes and we would like to recheck her blood pressure in office in about 3 months with labs due at that time      Coronary artery disease involving native heart    She has cardiology appointment in 2 days to establish locally        Respiratory   Moderate persistent asthma without complication    She has not used Trelegy for about a year she endorses needing albuterol rescue inhaler pretty often particularly with exposure to outdoors or much worse symptoms during spring season Discussed the transition of asthma management to Symbicort for rescue, since she has poor compliance to any maintenance inhalers we can try Symbicort as maintenance and rescue inhaler encouraged her to stay on her allergy and asthma controller medicine such as the antihistamines and Singulair may be helpful      Relevant Medications   budesonide-formoterol (SYMBICORT) 160-4.5 MCG/ACT inhaler     Other   Hx of myocardial infarction    Reports 3 heart attacks with scar tissue to her heart without any prior CABG or PCI this was more than 30 years ago she is due to follow-up with cardiology this week I would like to get her on an ACE inhibitor or ARB with her blood pressure management and we will need help getting cholesterol optimized to reduce the risk of events -statin intolerance and despite prescribing for her many times she has not tried to take Zetia      Hyperlipidemia    Poorly controlled, statin intolerant, not taking Zetia she likes to do naturopathic treatment  such as drinking vinegar Lab Results  Component Value Date   CHOL 249 (H) 07/12/2022   HDL 48 (L) 07/12/2022   LDLCALC 170 (H) 07/12/2022   TRIG 161 (H) 07/12/2022   CHOLHDL 5.2 (H) 07/12/2022         GAD (generalized anxiety disorder)    Same as below continue Prozac 20 mg daily      Current mild episode of major depressive disorder, unspecified whether recurrent ([facility])    PHQ-9 is minimally positive with a score of 5 she reports feeling really good right now due to the resolution of some significant stressors in her life she would like to continue Prozac 20 mg daily no side effects or concerns with the medicines and she is talking with someone regularly to help with working through stress and finding coping mechanisms and resources      Other Visit Diagnoses     Stage 3a chronic kidney disease ([place])       plan to add ACEI/ARB and recheck renal function  in 3 months        No follow-ups on file.   Delsa Grana, PA-C 10/11/22 3:17 PM

## 2022-10-11 NOTE — Assessment & Plan Note (Signed)
Poorly controlled, statin intolerant, not taking Zetia she likes to do naturopathic treatment such as drinking vinegar Lab Results  Component Value Date   CHOL 249 (H) 07/12/2022   HDL 48 (L) 07/12/2022   LDLCALC 170 (H) 07/12/2022   TRIG 161 (H) 07/12/2022   CHOLHDL 5.2 (H) 07/12/2022

## 2022-10-11 NOTE — Assessment & Plan Note (Signed)
Well-controlled today on her current medications but I would like to change them to give some renal protection with CKD She is meeting with cardiology in about 2 days I will wait to see what they advise but it may be beneficial to have her on something like lisinopril-HCTZ 20-12.5 and reduce HCTZ from 25, she has no significant LE edema - I think she would do well with this adjustment - will discuss with cardiology Advised pt I will get back to her about what cardiology says and the med changes and we would like to recheck her blood pressure in office in about 3 months with labs due at that time

## 2022-10-11 NOTE — Assessment & Plan Note (Signed)
Reports 3 heart attacks with scar tissue to her heart without any prior CABG or PCI this was more than 30 years ago she is due to follow-up with cardiology this week I would like to get her on an ACE inhibitor or ARB with her blood pressure management and we will need help getting cholesterol optimized to reduce the risk of events -statin intolerance and despite prescribing for her many times she has not tried to take Zetia

## 2022-10-11 NOTE — Progress Notes (Deleted)
Cardiology Clinic Note   Patient Name: Tricia Vasquez Date of Encounter: 10/11/2022  Primary Care Provider:  Delsa Grana, PA-C Primary Cardiologist:  Kate Sable, MD  Patient Profile    71 year old female with a past medical history of coronary artery disease with a history of asthma, hypertension, hyperlipidemia, TIA, who is here today for follow-up.  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   Osteoporosis    Sleep apnea    No CPAP   Stroke ([place]) 2013   Past Surgical History:  Procedure Laterality Date   ABDOMINAL HYSTERECTOMY     COLON SURGERY     COLONOSCOPY WITH PROPOFOL N/A 10/04/2018   Procedure: COLONOSCOPY WITH PROPOFOL;  Surgeon: Lucilla Lame, MD;  Location: [place];  Service: Endoscopy;  Laterality: N/A;  sleep apnea   OVARIAN CYST REMOVAL     POLYPECTOMY  10/04/2018   Procedure: POLYPECTOMY;  Surgeon: Lucilla Lame, MD;  Location: [facility];  Service: Endoscopy;;   TONSILLECTOMY      Allergies  Allergies  Allergen Reactions   Penicillins Swelling and Rash        Statins Shortness Of Breath    And chest pain   Aspirin Other (See Comments)    Tongue swelling    History of Present Illness    Tricia Vasquez is a 71 year old female with a history of coronary artery disease 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  Patient states that she had no intervention such as balloon angioplasty or stenting that was done in the previous MIs that she had so she was in [place] at the time.  Echocardiogram that was completed 07/2014 showed normal systolic function, normal diastolic function, an EF of 60-65%.  She was last  seen in clinic 09/10/2020 by Dr. Garen Lah as a new patient to establish care with a previous cardiac history.  She was started on Zetia and was ordered an echocardiogram.  Echo revealed an LVEF of 55-60%, no regional wall motion abnormality, G2 DD DD, mildly elevated pulmonary artery systolic pressure, mild to moderately dilated left atrium, and mild mitral valve regurgitation.  She returns to clinic today  Home Medications    Current Outpatient Medications  Medication Sig Dispense Refill   albuterol (VENTOLIN HFA) 108 (90 Base) MCG/ACT inhaler INHALE 2 PUFFS INTO THE LUNGS EVERY 4 HOURS AS NEEDED FOR WHEEZE OR FOR SHORTNESS OF BREATH 6.7 each 5   aspirin EC 81 MG tablet Take 81 mg by mouth 2 (two) times daily.     atenolol (TENORMIN) 50 MG tablet Take 1 tablet (50 mg total) by mouth daily. 90 tablet 1   Cholecalciferol (VITAMIN D) 2000 units tablet Take 2,000 Units by mouth daily.     Cyanocobalamin (VITAMIN B-12) 5000 MCG SUBL Place 1 tablet under the tongue 3 (three) times a week.     ezetimibe (ZETIA) 10 MG tablet Take 1 tablet (10 mg total) by mouth daily. 90 tablet 3   fexofenadine (ALLEGRA) 180 MG tablet TAKE 1 TABLET BY MOUTH EVERY DAY 90 tablet 3   FLUoxetine (PROZAC) 20 MG tablet TAKE 1 TABLET BY MOUTH  EVERY DAY 90 tablet 0   fluticasone (FLONASE) 50 MCG/ACT nasal spray USE 2 SPRAYS IN EACH NOSTRIL ONCE DAILY 48 mL 3   Fluticasone-Umeclidin-Vilant (TRELEGY ELLIPTA) 100-62.5-25 MCG/INH AEPB Inhale 1 puff into the lungs daily. 28 each 11   GARLIC OIL PO Take 1 pg/oz/day by mouth once.     hydrochlorothiazide (HYDRODIURIL) 25 MG tablet TAKE 1 TABLET BY MOUTH EVERY DAY 90 tablet 3   meclizine (ANTIVERT) 25 MG tablet TAKE 1 TABLET BY MOUTH 3 TIMES DAILY AS NEEDED FOR DIZZINESS. 60 tablet 5   Milk Thistle 250 MG CAPS Take 1 capsule by mouth once.     Misc Natural Products (DANDELION ROOT PO) Take 1 tablet by mouth once.     montelukast (SINGULAIR) 10 MG tablet TAKE 1 TABLET BY MOUTH  EVERYDAY AT BEDTIME 90 tablet 0   Multiple Vitamins-Minerals (MULTIVITAMIN WOMENS 50+ ADV PO) Take by mouth.     nitroGLYCERIN (NITROSTAT) 0.4 MG SL tablet TAKE 1 TAB UNDER THE TONGUE EVERY 5 MIN AS NEEDED FOR CHEST PAIN. CALL 911. MAX 3 PER EPISODE 25 tablet 0   OVER THE COUNTER MEDICATION Cream of tartar 3 times per month     TURMERIC PO Take 1 tablet by mouth once.     No current facility-administered medications for this visit.     Family History    Family History  Problem Relation Age of Onset   Hypertension Mother    Hypertension Father    Hypertension Sister    Hypertension Brother    She indicated that the status of her mother is unknown. She indicated that the status of her father is unknown. She indicated that the status of her sister is unknown. She indicated that the status of her brother is unknown.  Social History    Social History   Socioeconomic History   Marital status: Divorced    Spouse name: Not on file   Number of children: Not on file   Years of education: Not on file   Highest education level: Not on file  Occupational History   Not on file  Tobacco Use   Smoking status: Former    Types: Cigarettes    Quit date: 1970    Years since quitting: 53.9   Smokeless tobacco: Never  Vaping Use   Vaping Use: Never used  Substance and Sexual Activity   Alcohol use: No    Alcohol/week: 0.0 standard drinks of alcohol    Comment: may have a drink 1x/mo   Drug use: No   Sexual activity: Not Currently  Other Topics Concern   Not on file  Social History Narrative   Pt lives alone    Social Determinants of Health   Financial Resource Strain: Low Risk  (04/05/2022)   Overall Financial Resource Strain (CARDIA)    Difficulty of Paying Living Expenses: Not hard at all  Food Insecurity: No Food Insecurity (08/25/2022)   Hunger Vital Sign    Worried About Running Out of Food in the Last Year: Never true    [place] in the Last Year: Never true   Transportation Needs: No Transportation Needs (08/25/2022)   PRAPARE - Hydrologist (Medical): No    Lack of Transportation (Non-Medical): No  Physical Activity: Sufficiently Active (04/05/2022)   Exercise Vital Sign    Days of Exercise per Week: 7 days    Minutes of Exercise per Session: 30 min  Stress: No Stress Concern Present (04/05/2022)  [place] Questionnaire    Feeling of Stress : Not at all  Social Connections: Socially Isolated (04/05/2022)   Social Connection and Isolation Panel [NHANES]    Frequency of Communication with Friends and Family: More than three times a week    Frequency of Social Gatherings with Friends and Family: Once a week    Attends Religious Services: Never    Marine scientist or Organizations: No    Attends Archivist Meetings: Never    Marital Status: Divorced  Human resources officer Violence: Not At Risk (04/05/2022)   Humiliation, Afraid, Rape, and Kick questionnaire    Fear of Current or Ex-Partner: No    Emotionally Abused: No    Physically Abused: No    Sexually Abused: No     Review of Systems    General:  No chills, fever, night sweats or weight changes.  Cardiovascular:  No chest pain, dyspnea on exertion, edema, orthopnea, palpitations, paroxysmal nocturnal dyspnea. Dermatological: No rash, lesions/masses Respiratory: No cough, dyspnea Urologic: No hematuria, dysuria Abdominal:   No nausea, vomiting, diarrhea, bright red blood per rectum, melena, or hematemesis Neurologic:  No visual changes, wkns, changes in mental status. All other systems reviewed and are otherwise negative except as noted above.     Physical Exam    VS:  There were no vitals taken for this visit. , BMI There is no height or weight on file to calculate BMI.     GEN: Well nourished, well developed, in no acute distress. HEENT: normal. Neck: Supple, no JVD, carotid bruits, or  masses. Cardiac: RRR, no murmurs, rubs, or gallops. No clubbing, cyanosis, edema.  Radials 2+/PT 2+ and equal bilaterally.  Respiratory:  Respirations regular and unlabored, clear to auscultation bilaterally. GI: Soft, nontender, nondistended, BS + x 4. MS: no deformity or atrophy. Skin: warm and dry, no rash. Neuro:  Strength and sensation are intact. Psych: Normal affect.  Accessory Clinical Findings    ECG personally reviewed by me today- *** - No acute changes  Lab Results  Component Value Date   WBC 5.8 07/12/2022   HGB 13.2 07/12/2022   HCT 39.1 07/12/2022   MCV 92.9 07/12/2022   PLT 220 07/12/2022   Lab Results  Component Value Date   CREATININE 1.10 (H) 07/12/2022   BUN 34 (H) 07/12/2022   NA 143 07/12/2022   K 4.0 07/12/2022   CL 106 07/12/2022   CO2 27 07/12/2022   Lab Results  Component Value Date   ALT 26 07/12/2022   AST 19 07/12/2022   BILITOT 0.3 07/12/2022   Lab Results  Component Value Date   CHOL 249 (H) 07/12/2022   HDL 48 (L) 07/12/2022   LDLCALC 170 (H) 07/12/2022   TRIG 161 (H) 07/12/2022   CHOLHDL 5.2 (H) 07/12/2022    No results found for: "HGBA1C"  Assessment & Plan   1.  ***  Airyonna Franklyn, NP 10/11/2022, 9:44 AM

## 2022-10-12 NOTE — Progress Notes (Signed)
Cardiology Clinic Note   Patient Name: Tricia Vasquez Date of Encounter: 10/14/2022  Primary Care Provider:  Delsa Grana, PA-C Primary Cardiologist:  Kate Sable, MD  Patient Profile    71 year old female with a past medical history of coronary artery disease with a history of asthma, hypertension, hyperlipidemia, TIA, who is here today for follow-up.   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   Osteoporosis    Sleep apnea    No CPAP   Stroke ([place]) 2013   Past Surgical History:  Procedure Laterality Date   ABDOMINAL HYSTERECTOMY     COLON SURGERY     COLONOSCOPY WITH PROPOFOL N/A 10/04/2018   Procedure: COLONOSCOPY WITH PROPOFOL;  Surgeon: Lucilla Lame, MD;  Location: [facility];  Service: Endoscopy;  Laterality: N/A;  sleep apnea   OVARIAN CYST REMOVAL     POLYPECTOMY  10/04/2018   Procedure: POLYPECTOMY;  Surgeon: Lucilla Lame, MD;  Location: [facility];  Service: Endoscopy;;   TONSILLECTOMY      Allergies  Allergies  Allergen Reactions   Penicillins Swelling and Rash        Statins Shortness Of Breath    And chest pain   Aspirin Other (See Comments)    Tongue swelling    History of Present Illness    Tricia Vasquez is a 71 year old female with a history of coronary artery disease 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   Patient states that she had no intervention such as balloon angioplasty or stenting that was done in the previous MIs that she had so she was in [place] at the time.  Echocardiogram that was completed 07/2014 showed normal systolic function, normal diastolic function, an EF of 60-65%.   She was last  seen in clinic 09/10/2020 by Dr. Garen Lah as a new patient to establish care with a previous cardiac history.  She was started on Zetia and was ordered an echocardiogram.  Echo revealed an LVEF of 55-60%, no regional wall motion abnormality, G2 DD DD, mildly elevated pulmonary artery systolic pressure, mild to moderately dilated left atrium, and mild mitral valve regurgitation.   She returns to clinic today stating that overall she has been doing fairly well from a cardiac standpoint.  She has been working on reducing the stress that she has in her life she states that it has not done very well for her at this point.  She is unable to tolerate the ezetimibe said that she was previously placed on by her PCP and has questions related to cholesterol today.  She denies any current chest pain, shortness of breath, dyspnea on exertion, or palpitations.  She also denies any hospitalizations or visits to the emergency department.  Home Medications    Current Outpatient Medications  Medication Sig Dispense Refill   albuterol (VENTOLIN HFA) 108 (90 Base) MCG/ACT inhaler INHALE 2 PUFFS INTO THE LUNGS EVERY 4 HOURS AS NEEDED FOR WHEEZE OR FOR SHORTNESS OF BREATH 6.7 each 5   aspirin EC 81 MG tablet Take 81 mg by mouth 2 (two) times daily.     atenolol (TENORMIN) 50 MG  tablet Take 1 tablet (50 mg total) by mouth daily. 90 tablet 1   Carboxymethylcellulose Sodium (EYE DROPS OP) Apply to eye daily.     Cholecalciferol (VITAMIN D) 2000 units tablet Take 2,000 Units by mouth daily.     COLLAGEN PO Take 1 Scoop by mouth 4 (four) times a week. Native Path Collagen     Cyanocobalamin (VITAMIN B-12) 5000 MCG SUBL Place 1 tablet under the tongue 3 (three) times a week.     Evolocumab (REPATHA SURECLICK) 017 MG/ML SOAJ Inject 140 mg into the skin every 14 (fourteen) days. 2 mL 6   fexofenadine (ALLEGRA) 180 MG tablet TAKE 1 TABLET BY MOUTH EVERY DAY 90 tablet 3   FLUoxetine (PROZAC) 20 MG tablet TAKE 1 TABLET BY MOUTH  EVERY DAY 90 tablet 0   fluticasone (FLONASE) 50 MCG/ACT nasal spray USE 2 SPRAYS IN EACH NOSTRIL ONCE DAILY 48 mL 3   GARLIC OIL PO Take 1 pg/oz/day by mouth once.     hydrochlorothiazide (HYDRODIURIL) 25 MG tablet TAKE 1 TABLET BY MOUTH EVERY DAY 90 tablet 3   meclizine (ANTIVERT) 25 MG tablet TAKE 1 TABLET BY MOUTH 3 TIMES DAILY AS NEEDED FOR DIZZINESS. 60 tablet 5   Milk Thistle 250 MG CAPS Take 1 capsule by mouth once.     Misc Natural Products (DANDELION ROOT PO) Take 1 tablet by mouth once.     montelukast (SINGULAIR) 10 MG tablet TAKE 1 TABLET BY MOUTH EVERYDAY AT BEDTIME 90 tablet 0   Multiple Vitamins-Minerals (LIVER DETOX PO) Take by mouth daily.     Multiple Vitamins-Minerals (MULTIVITAMIN WOMENS 50+ ADV PO) Take by mouth.     nitroGLYCERIN (NITROSTAT) 0.4 MG SL tablet TAKE 1 TAB UNDER THE TONGUE EVERY 5 MIN AS NEEDED FOR CHEST PAIN. CALL 911. MAX 3 PER EPISODE 25 tablet 0   OVER THE COUNTER MEDICATION Cream of tartar 3 times per month     Pseudoephedrine-guaiFENesin (MUCINEX D PO) Take by mouth daily. Daytime/Night     TURMERIC PO Take 1 tablet by mouth once.     budesonide-formoterol (SYMBICORT) 160-4.5 MCG/ACT inhaler Inhale 2 puffs into the lungs 2 (two) times daily. (Patient not taking: Reported on 10/13/2022) 3 each 3   ezetimibe (ZETIA) 10 MG tablet Take 1 tablet (10 mg total) by mouth daily. (Patient not taking: Reported on 10/13/2022) 90 tablet 3   No current facility-administered medications for this visit.     Family History    Family History  Problem Relation Age of Onset   Hypertension Mother    Hypertension Father    Hypertension Sister    Hypertension Brother    She indicated that the status of her mother is unknown. She indicated that the status of her father is unknown. She indicated that the status of her sister is unknown. She indicated that the status of her brother is unknown.  Social History    Social History   Socioeconomic History   Marital  status: Divorced    Spouse name: Not on file   Number of children: Not on file   Years of education: Not on file   Highest education level: Not on file  Occupational History   Not on file  Tobacco Use   Smoking status: Former    Types: Cigarettes    Quit date: 1970    Years since quitting: 53.9   Smokeless tobacco: Never  Vaping Use   Vaping Use: Never used  Substance and Sexual Activity  Alcohol use: No    Alcohol/week: 0.0 standard drinks of alcohol    Comment: may have a drink 1x/mo   Drug use: No   Sexual activity: Not Currently  Other Topics Concern   Not on file  Social History Narrative   Pt lives alone    Social Determinants of Health   Financial Resource Strain: Low Risk  (04/05/2022)   Overall Financial Resource Strain (CARDIA)    Difficulty of Paying Living Expenses: Not hard at all  Food Insecurity: No Food Insecurity (08/25/2022)   Hunger Vital Sign    Worried About Running Out of Food in the Last Year: Never true    Ran Out of Food in the Last Year: Never true  Transportation Needs: No Transportation Needs (08/25/2022)   PRAPARE - Hydrologist (Medical): No    Lack of Transportation (Non-Medical): No  Physical Activity: Sufficiently Active (04/05/2022)   Exercise Vital Sign    Days of Exercise per Week: 7 days    Minutes of Exercise per Session: 30 min  Stress: No Stress Concern Present (04/05/2022)   [place]    Feeling of Stress : Not at all  Social Connections: Socially Isolated (04/05/2022)   Social Connection and Isolation Panel [NHANES]    Frequency of Communication with Friends and Family: More than three times a week    Frequency of Social Gatherings with Friends and Family: Once a week    Attends Religious Services: Never    Marine scientist or Organizations: No    Attends Archivist Meetings: Never    Marital Status: Divorced  Arboriculturist Violence: Not At Risk (04/05/2022)   Humiliation, Afraid, Rape, and Kick questionnaire    Fear of Current or Ex-Partner: No    Emotionally Abused: No    Physically Abused: No    Sexually Abused: No     Review of Systems    General:  No chills, fever, night sweats or weight changes.  Endorses exertional fatigue Cardiovascular:  No chest pain, dyspnea on exertion, endorses occasional peripheral edema edema, orthopnea, palpitations, paroxysmal nocturnal dyspnea. Dermatological: No rash, lesions/masses Respiratory: No cough, dyspnea Urologic: No hematuria, dysuria Abdominal:   No nausea, vomiting, diarrhea, bright red blood per rectum, melena, or hematemesis Neurologic:  No visual changes, wkns, changes in mental status. All other systems reviewed and are otherwise negative except as noted above.   Physical Exam    VS:  BP 118/68 (BP Location: Left Arm, Patient Position: Sitting, Cuff Size: Normal)   Pulse (!) 54   Ht '5\' 3"'$  (1.6 m)   Wt 158 lb 12.8 oz (72 kg)   SpO2 99%   BMI 28.13 kg/m  , BMI Body mass index is 28.13 kg/m.     GEN: Well nourished, well developed, in no acute distress. HEENT: normal.  Glasses on Neck: Supple, no JVD, carotid bruits, or masses. Cardiac: RRR, no murmurs, rubs, or gallops. No clubbing, cyanosis, trace pretibial edema.  Radials 2+/PT 2+ and equal bilaterally.  Respiratory:  Respirations regular and unlabored, clear to auscultation bilaterally. GI: Soft, nontender, nondistended, BS + x 4. MS: no deformity or atrophy. Skin: warm and dry, no rash. Neuro:  Strength and sensation are intact. Psych: Normal affect.  Accessory Clinical Findings    ECG personally reviewed by me today-sinus bradycardia with a rate of 54, left axis deviation, enlargement of artifact at baseline today with some noted  nonspecific ST changes.- No acute changes  Lab Results  Component Value Date   WBC 5.8 07/12/2022   HGB 13.2 07/12/2022   HCT 39.1 07/12/2022   MCV  92.9 07/12/2022   PLT 220 07/12/2022   Lab Results  Component Value Date   CREATININE 1.10 (H) 07/12/2022   BUN 34 (H) 07/12/2022   NA 143 07/12/2022   K 4.0 07/12/2022   CL 106 07/12/2022   CO2 27 07/12/2022   Lab Results  Component Value Date   ALT 26 07/12/2022   AST 19 07/12/2022   BILITOT 0.3 07/12/2022   Lab Results  Component Value Date   CHOL 249 (H) 07/12/2022   HDL 48 (L) 07/12/2022   LDLCALC 170 (H) 07/12/2022   TRIG 161 (H) 07/12/2022   CHOLHDL 5.2 (H) 07/12/2022    No results found for: "HGBA1C"  Assessment & Plan   1.  Coronary artery disease of native artery without anginal symptoms.  She had history of myocardial infarction in the 18s and 90s.  Unfortunately she is intolerant to statins.  She is continued on aspirin 81 mg, she has Nitrostat 0.4 mg sublingual as needed.  After further discussion of cholesterol she has been started on Repatha 140 mg subcu injection every 2 weeks.  She will need lipid and hepatic panel rechecked in 3 months after the initiation of Repatha.  2.  Essential hypertension with blood pressure today 118/68 which is remained stable.  She is continued on hydrochlorothiazide 25 mg daily and atenolol 50 mg daily she has been encouraged to continue to monitor her blood pressure at home as well.  3.  Mixed hyperlipidemia with LDL of 170 not at goal of less than 70 as discussed with the patient.  This was done 07/12/2022 and has been followed by PCP.  She has been initiated on a PCSK9i today with the elevated LDL.  Will repeat a lipid and hepatic panel in 3 months.  She is very apprehensive of trying to start any type of statin or statin derivative due to statin side calls rash and extreme shortness of breath which is required quick hospitalization.  4.  Asthma that is stable.  Of breath or dyspnea on exertion.  She continues on her Singulair and a rescue inhaler of albuterol.  This continues to be managed by her PCP.  5.  Physician patient return  to clinic to see MD/APP in 1 year with EKG on return or sooner if needed. Syreeta Figler, NP 10/14/2022, 8:48 AM

## 2022-10-13 ENCOUNTER — Ambulatory Visit: Payer: Medicare Other | Attending: Cardiology | Admitting: Cardiology

## 2022-10-13 ENCOUNTER — Encounter: Payer: Self-pay | Admitting: Cardiology

## 2022-10-13 VITALS — BP 118/68 | HR 54 | Ht 63.0 in | Wt 158.8 lb

## 2022-10-13 DIAGNOSIS — J4520 Mild intermittent asthma, uncomplicated: Secondary | ICD-10-CM | POA: Diagnosis not present

## 2022-10-13 DIAGNOSIS — I10 Essential (primary) hypertension: Secondary | ICD-10-CM | POA: Insufficient documentation

## 2022-10-13 DIAGNOSIS — E785 Hyperlipidemia, unspecified: Secondary | ICD-10-CM | POA: Insufficient documentation

## 2022-10-13 DIAGNOSIS — I2510 Atherosclerotic heart disease of native coronary artery without angina pectoris: Secondary | ICD-10-CM | POA: Diagnosis not present

## 2022-10-13 MED ORDER — REPATHA SURECLICK 140 MG/ML ~~LOC~~ SOAJ: 140.0000 mg | SUBCUTANEOUS | 6 refills | Status: DC

## 2022-10-13 NOTE — Patient Instructions (Signed)
Medication Instructions:  Your physician has recommended you make the following change in your medication:   START Repatha every 2 weeks.   *If you need a refill on your cardiac medications before your next appointment, please call your pharmacy*   Lab Work: Lipid panel in 3 months. No appointment is needed for this. Just go to the following location:  [facility] Entrance at [facility] 1st desk on the right to check in (REGISTRATION)  Lab hours: Monday- Friday (7:30 am- 5:30 pm)  If you have labs (blood work) drawn today and your tests are completely normal, you will receive your results only by: MyChart Message (if you have MyChart) OR A paper copy in the mail If you have any lab test that is abnormal or we need to change your treatment, we will call you to review the results.   Testing/Procedures: None   Follow-Up: At [facility], you and your health needs are our priority.  As part of our continuing mission to provide you with exceptional heart care, we have created designated Provider Care Teams.  These Care Teams include your primary Cardiologist (physician) and Advanced Practice Providers (APPs -  Physician Assistants and Nurse Practitioners) who all work together to provide you with the care you need, when you need it.  We recommend signing up for the patient portal called "MyChart".  Sign up information is provided on this After Visit Summary.  MyChart is used to connect with patients for Virtual Visits (Telemedicine).  Patients are able to view lab/test results, encounter notes, upcoming appointments, etc.  Non-urgent messages can be sent to your provider as well.   To learn more about what you can do with MyChart, go to [email or web address].    Your next appointment:   1 year(s)  The format for your next appointment:   In Person  Provider:   Kate Sable, MD or Gerrie Nordmann, NP       Important Information About Sugar

## 2022-10-14 NOTE — Telephone Encounter (Signed)
PA required for Repatha 140 mg/ml inj. PA has been completed through Covermymeds. Awaiting Approval

## 2022-11-02 ENCOUNTER — Telehealth: Payer: Self-pay

## 2022-11-02 NOTE — Telephone Encounter (Signed)
Received fax from [facility] stating that the prior authorization request for Repatha '140mg'$ /ml has been approved. The authorization is valid from 09/18/2022 through 10/18/2023.

## 2023-01-07 ENCOUNTER — Other Ambulatory Visit: Payer: Self-pay | Admitting: Family Medicine

## 2023-01-07 DIAGNOSIS — F320 Major depressive disorder, single episode, mild: Secondary | ICD-10-CM

## 2023-01-07 DIAGNOSIS — I10 Essential (primary) hypertension: Secondary | ICD-10-CM

## 2023-01-07 DIAGNOSIS — I252 Old myocardial infarction: Secondary | ICD-10-CM

## 2023-01-07 DIAGNOSIS — F439 Reaction to severe stress, unspecified: Secondary | ICD-10-CM

## 2023-01-10 ENCOUNTER — Ambulatory Visit (INDEPENDENT_AMBULATORY_CARE_PROVIDER_SITE_OTHER): Payer: Medicare Other | Admitting: Family Medicine

## 2023-01-10 ENCOUNTER — Encounter: Payer: Self-pay | Admitting: Family Medicine

## 2023-01-10 VITALS — BP 122/74 | HR 68 | Temp 97.7°F | Resp 16 | Ht 63.0 in | Wt 154.6 lb

## 2023-01-10 DIAGNOSIS — J45901 Unspecified asthma with (acute) exacerbation: Secondary | ICD-10-CM

## 2023-01-10 DIAGNOSIS — Z8673 Personal history of transient ischemic attack (TIA), and cerebral infarction without residual deficits: Secondary | ICD-10-CM | POA: Diagnosis not present

## 2023-01-10 DIAGNOSIS — I2510 Atherosclerotic heart disease of native coronary artery without angina pectoris: Secondary | ICD-10-CM

## 2023-01-10 DIAGNOSIS — M810 Age-related osteoporosis without current pathological fracture: Secondary | ICD-10-CM

## 2023-01-10 DIAGNOSIS — N1831 Chronic kidney disease, stage 3a: Secondary | ICD-10-CM

## 2023-01-10 DIAGNOSIS — H8103 Meniere's disease, bilateral: Secondary | ICD-10-CM

## 2023-01-10 DIAGNOSIS — I10 Essential (primary) hypertension: Secondary | ICD-10-CM

## 2023-01-10 DIAGNOSIS — Z789 Other specified health status: Secondary | ICD-10-CM

## 2023-01-10 DIAGNOSIS — E785 Hyperlipidemia, unspecified: Secondary | ICD-10-CM | POA: Diagnosis not present

## 2023-01-10 DIAGNOSIS — I252 Old myocardial infarction: Secondary | ICD-10-CM

## 2023-01-10 DIAGNOSIS — J4540 Moderate persistent asthma, uncomplicated: Secondary | ICD-10-CM

## 2023-01-10 DIAGNOSIS — F411 Generalized anxiety disorder: Secondary | ICD-10-CM

## 2023-01-10 DIAGNOSIS — R42 Dizziness and giddiness: Secondary | ICD-10-CM

## 2023-01-10 DIAGNOSIS — J3089 Other allergic rhinitis: Secondary | ICD-10-CM

## 2023-01-10 DIAGNOSIS — F439 Reaction to severe stress, unspecified: Secondary | ICD-10-CM

## 2023-01-10 DIAGNOSIS — F320 Major depressive disorder, single episode, mild: Secondary | ICD-10-CM

## 2023-01-10 MED ORDER — FLUOXETINE HCL 20 MG PO TABS: 20.0000 mg | ORAL_TABLET | Freq: Every day | ORAL | 3 refills | Status: DC

## 2023-01-10 MED ORDER — MONTELUKAST SODIUM 10 MG PO TABS: ORAL_TABLET | ORAL | 1 refills | Status: DC

## 2023-01-10 MED ORDER — MECLIZINE HCL 25 MG PO TABS: ORAL_TABLET | ORAL | 5 refills | Status: DC

## 2023-01-10 NOTE — Assessment & Plan Note (Signed)
BP at goal today on current management, stable Atenolol and HCTZ BP Readings from Last 3 Encounters:  01/10/23 122/74  10/13/22 118/68  10/11/22 130/74

## 2023-01-10 NOTE — Assessment & Plan Note (Signed)
On prozac 20 Mood good    01/10/2023    1:48 PM 10/11/2022    2:59 PM 08/25/2022    2:30 PM  Depression screen PHQ 2/9  Decreased Interest 0 1 0  Down, Depressed, Hopeless 0 1 1  PHQ - 2 Score 0 2 1  Altered sleeping 0 0   Tired, decreased energy 0 2   Change in appetite 0 0   Feeling bad or failure about yourself  0 1   Trouble concentrating 0 0   Moving slowly or fidgety/restless 0 0   Suicidal thoughts 0 0   PHQ-9 Score 0 5   Difficult doing work/chores Not difficult at all Somewhat difficult   Phq neg reviewed today

## 2023-01-10 NOTE — Assessment & Plan Note (Addendum)
Managed with diet/lifestyle and meclizine I reviewed more effective tx with diet/meds that may be options since we want to minimize meds like meclizine due to sedation and pts age

## 2023-01-10 NOTE — Assessment & Plan Note (Signed)
Lab Results  Component Value Date   CHOL 249 (H) 07/12/2022   HDL 48 (L) 07/12/2022   LDLCALC 170 (H) 07/12/2022   TRIG 161 (H) 07/12/2022   CHOLHDL 5.2 (H) 07/12/2022  She worked on some diet changes and wants to see improvement and is fasting today Did not start repatha Encouraged her to start after labs, will be due for repeat lipids and LFTs 3 month after starting per cardiology

## 2023-01-10 NOTE — Assessment & Plan Note (Signed)
Needs to improve LDL control, unable to take statins, encouraged to start repatha On asa BP is well controlled 1987 was first stroke, most recent 2015

## 2023-01-10 NOTE — Assessment & Plan Note (Signed)
She consulted with cardiology they prescribed repatha - but she hasn't started it yet

## 2023-01-10 NOTE — Assessment & Plan Note (Addendum)
Symbicort and albuterol, recently more symptoms with triggers like fire/smoke burning things outside No recent flares Encouraged her to avoid triggers, keep allergy medications in system regularly for spring, continue singulair, start symbicort 2 puffs BID if any worsening sx

## 2023-01-10 NOTE — Patient Instructions (Signed)
[facility] at [facility] [id number], [place], [place] [address] Scheduling phone #: [phone]  Call to schedule bone density and mammogram   Health Maintenance  Topic Date Due   Mammogram  Never done   DEXA scan (bone density measurement)  Never done   Flu Shot  01/29/2023*   Medicare Annual Wellness Visit  04/06/2023   Colon Cancer Screening  10/05/2023   DTaP/Tdap/Td vaccine (2 - Td or Tdap) 09/25/2028   Hepatitis C Screening: USPSTF Recommendation to screen - Ages 43-79 yo.  Completed   HPV Vaccine  Aged Out   Pneumonia Vaccine  Discontinued   COVID-19 Vaccine  Discontinued   Zoster (Shingles) Vaccine  Discontinued  *Topic was postponed. The date shown is not the original due date.

## 2023-01-10 NOTE — Assessment & Plan Note (Signed)
Encouraged good compliance to second-generation antihistamine, intranasal steroid sprays and Singulair

## 2023-01-10 NOTE — Assessment & Plan Note (Signed)
Sx currently well controlled and stable with SSRI and SW/therapist    10/11/2022    3:14 PM 07/12/2022    2:36 PM 05/31/2021    1:54 PM 11/27/2020   12:25 PM  GAD 7 : Generalized Anxiety Score  Nervous, Anxious, on Edge '1 2 1 1  '$ Control/stop worrying '1 2 1 '$ 0  Worry too much - different things '1 2 1 '$ 0  Trouble relaxing '1 2 1 1  '$ Restless 1 2 0 0  Easily annoyed or irritable 0 0 0 0  Afraid - awful might happen 0 0 1 0  Total GAD 7 Score '5 10 5 2  '$ Anxiety Difficulty Not difficult at all Very difficult Somewhat difficult Not difficult at all

## 2023-01-10 NOTE — Assessment & Plan Note (Signed)
She needs to start repatha, otherwise on BB, HCTZ, seeing cardiology now, will be due for f/up in Dec

## 2023-01-10 NOTE — Assessment & Plan Note (Signed)
Per cardiology now monitoring pt at least annually

## 2023-01-10 NOTE — Progress Notes (Signed)
Name: Tricia Vasquez   MRN: [id number]    DOB: [birth year]   Date:01/10/2023       Progress Note  Chief Complaint  Patient presents with   Follow-up   Hypertension   Hyperlipidemia   Depression     Subjective:   Tricia Vasquez is a 72 y.o. female, presents to clinic for f/up on chronic conditions  HLD statin intolerance - not taking zetia,  - she did discuss with cardiology- should have seen in Dec - they ordered repatha for her she hasn't started it Patient states that she was going to recheck her cholesterol before starting Repatha however cardiology office visit note states that she should have started Repatha and then recheck labs 3 months after Lab Results  Component Value Date   CHOL 249 (H) 07/12/2022   HDL 48 (L) 07/12/2022   LDLCALC 170 (H) 07/12/2022   TRIG 161 (H) 07/12/2022   CHOLHDL 5.2 (H) 07/12/2022  She does have history of stroke and MRI should be on statins or have aggressive LDL goals Patient is open to starting [place] but she was working on diet changes and she wants to recheck her labs first   Hypertension:  discussed changing to ACEI/ARB with HCTZ Currently managed on HCTZ and BB Pt reports good med compliance and denies any SE.   Blood pressure today is well controlled. BP Readings from Last 3 Encounters:  01/10/23 122/74  10/13/22 118/68  10/11/22 130/74   Pt denies CP, SOB, exertional sx, LE edema, palpitation, Ha's, visual disturbances, lightheadedness, hypotension, syncope.  Ckd stage 3 Lab Results  Component Value Date   EGFR 53 (L) 07/12/2022   EGFR 44 (L) 05/31/2021     CAD with prior multiple MI's larger "scar on heart" no past PCI - due to est with cardiology, not currently having any exertional sx -cardiology evaluated her and did a EKG and reviewed her most recent cardiovascular testing which was a echo in 2021 they will be seeing her annually  Anxiety/stress: Working with CCM SW/therapy -she reports improved symptoms, she has  stayed on Prozac 20 mg and reports anxiety depressive symptoms are well-controlled    01/10/2023    1:48 PM 10/11/2022    2:59 PM 08/25/2022    2:30 PM  Depression screen PHQ 2/9  Decreased Interest 0 1 0  Down, Depressed, Hopeless 0 1 1  PHQ - 2 Score 0 2 1  Altered sleeping 0 0   Tired, decreased energy 0 2   Change in appetite 0 0   Feeling bad or failure about yourself  0 1   Trouble concentrating 0 0   Moving slowly or fidgety/restless 0 0   Suicidal thoughts 0 0   PHQ-9 Score 0 5   Difficult doing work/chores Not difficult at all Somewhat difficult     Asthma - using rescue inhaler -outdoor or morning exposure to weather can cause her more respiratory symptoms and she has much worse asthma seasonally she is compliant with her Allegra and sometimes uses the Flonase She was burning some stuff outside in the yard and this did trigger a lot more respiratory symptoms and chest pain which was quickly relieved with her inhaler No recent asthma exacerbations  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 and is also on allergy medications she previously did therapy and management with the specialist  History of osteoporosis on vitamin D supplement she is overdue for DEXA Last vitamin D Lab Results  Component Value Date  VD25OH 42 09/17/2018      Current Outpatient Medications:    albuterol (VENTOLIN HFA) 108 (90 Base) MCG/ACT inhaler, INHALE 2 PUFFS INTO THE LUNGS EVERY 4 HOURS AS NEEDED FOR WHEEZE OR FOR SHORTNESS OF BREATH, Disp: 6.7 each, Rfl: 5   aspirin EC 81 MG tablet, Take 81 mg by mouth 2 (two) times daily., Disp: , Rfl:    atenolol (TENORMIN) 50 MG tablet, TAKE 1 TABLET BY MOUTH EVERY DAY, Disp: 90 tablet, Rfl: 1   budesonide-formoterol (SYMBICORT) 160-4.5 MCG/ACT inhaler, Inhale 2 puffs into the lungs 2 (two) times daily., Disp: 3 each, Rfl: 3   Carboxymethylcellulose Sodium (EYE DROPS OP), Apply to eye daily., Disp: , Rfl:    Cholecalciferol (VITAMIN  D) 2000 units tablet, Take 2,000 Units by mouth daily., Disp: , Rfl:    COLLAGEN PO, Take 1 Scoop by mouth 4 (four) times a week. Native Path Collagen, Disp: , Rfl:    Cyanocobalamin (VITAMIN B-12) 5000 MCG SUBL, Place 1 tablet under the tongue 3 (three) times a week., Disp: , Rfl:    fexofenadine (ALLEGRA) 180 MG tablet, TAKE 1 TABLET BY MOUTH EVERY DAY, Disp: 90 tablet, Rfl: 3   FLUoxetine (PROZAC) 20 MG tablet, TAKE 1 TABLET BY MOUTH EVERY DAY, Disp: 90 tablet, Rfl: 0   fluticasone (FLONASE) 50 MCG/ACT nasal spray, USE 2 SPRAYS IN EACH NOSTRIL ONCE DAILY, Disp: 48 mL, Rfl: 3   GARLIC OIL PO, Take 1 pg/oz/day by mouth once., Disp: , Rfl:    hydrochlorothiazide (HYDRODIURIL) 25 MG tablet, TAKE 1 TABLET BY MOUTH EVERY DAY, Disp: 90 tablet, Rfl: 3   meclizine (ANTIVERT) 25 MG tablet, TAKE 1 TABLET BY MOUTH 3 TIMES DAILY AS NEEDED FOR DIZZINESS., Disp: 60 tablet, Rfl: 5   Milk Thistle 250 MG CAPS, Take 1 capsule by mouth once., Disp: , Rfl:    Misc Natural Products (DANDELION ROOT PO), Take 1 tablet by mouth once., Disp: , Rfl:    montelukast (SINGULAIR) 10 MG tablet, TAKE 1 TABLET BY MOUTH EVERYDAY AT BEDTIME, Disp: 90 tablet, Rfl: 0   Multiple Vitamins-Minerals (LIVER DETOX PO), Take by mouth daily., Disp: , Rfl:    Multiple Vitamins-Minerals (MULTIVITAMIN WOMENS 50+ ADV PO), Take by mouth., Disp: , Rfl:    nitroGLYCERIN (NITROSTAT) 0.4 MG SL tablet, TAKE 1 TAB UNDER THE TONGUE EVERY 5 MIN AS NEEDED FOR CHEST PAIN. CALL 911. MAX 3 PER EPISODE, Disp: 25 tablet, Rfl: 0   OVER THE COUNTER MEDICATION, Cream of tartar 3 times per month, Disp: , Rfl:    Pseudoephedrine-guaiFENesin (MUCINEX D PO), Take by mouth daily. Daytime/Night, Disp: , Rfl:    TURMERIC PO, Take 1 tablet by mouth once., Disp: , Rfl:    Evolocumab (REPATHA SURECLICK) [id number] MG/ML SOAJ, Inject 140 mg into the skin every 14 (fourteen) days. (Patient not taking: Reported on 01/10/2023), Disp: 2 mL, Rfl: 6   ezetimibe (ZETIA) 10 MG tablet,  Take 1 tablet (10 mg total) by mouth daily. (Patient not taking: Reported on 01/10/2023), Disp: 90 tablet, Rfl: 3  Patient Active Problem List   Diagnosis Date Noted   Statin intolerance 01/10/2023   Stage 3a chronic kidney disease ([place]) 01/10/2023   Environmental and seasonal allergies 01/10/2023   S/P abdominal hysterectomy 06/14/2021   Coronary artery disease involving native heart 05/31/2021   Current mild episode of major depressive disorder, unspecified whether recurrent ([place]) 05/31/2021   Benign neoplasm of transverse colon    Benign neoplasm of ascending colon  Cervical radiculopathy 12/22/2017   GAD (generalized anxiety disorder) 09/30/2016   Moderate persistent asthma without complication [id number]   Apnea, sleep 04/11/2016   Osteoporosis, 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   Primary hypertension 04/11/2016   Hyperlipidemia 04/11/2016   Depression 04/11/2016   Hx of myocardial infarction 03/24/2016   Hx of hepatitis C 03/24/2016   Hx of completed stroke 03/24/2016   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   Headache disorder 08/06/2014    Past Surgical History:  Procedure Laterality Date   ABDOMINAL HYSTERECTOMY     COLON SURGERY     COLONOSCOPY WITH PROPOFOL N/A 10/04/2018   Procedure: COLONOSCOPY WITH PROPOFOL;  Surgeon: Lucilla Lame, MD;  Location: [facility];  Service: Endoscopy;  Laterality: N/A;  sleep apnea   OVARIAN CYST REMOVAL     POLYPECTOMY  10/04/2018   Procedure: POLYPECTOMY;  Surgeon: Lucilla Lame, MD;  Location: [facility];  Service: Endoscopy;;   TONSILLECTOMY      Family History  Problem Relation Age of Onset   Hypertension Mother    Hypertension Father    Hypertension Sister    Hypertension Brother     Social History   Tobacco Use   Smoking status: Former    Types: Cigarettes    Quit date: 1970    Years since quitting: 54.2   Smokeless tobacco: Never  Vaping Use   Vaping Use: Never used  Substance Use Topics   Alcohol  use: No    Alcohol/week: 0.0 standard drinks of alcohol    Comment: may have a drink 1x/mo   Drug use: No     Allergies  Allergen Reactions   Penicillins Swelling and Rash        Statins Shortness Of Breath    And chest pain   Aspirin Other (See Comments)    Tongue swelling    Health Maintenance  Topic Date Due   MAMMOGRAM  Never done   DEXA SCAN  Never done   INFLUENZA VACCINE  01/29/2023 (Originally 05/31/2022)   Medicare Annual Wellness (AWV)  04/06/2023   COLONOSCOPY (Pts 45-69yr Insurance coverage will need to be confirmed)  10/05/2023   DTaP/Tdap/Td (2 - Td or Tdap) 09/25/2028   Hepatitis C Screening  Completed   HPV VACCINES  Aged Out   Pneumonia Vaccine 72 Years old  Discontinued   COVID-19 Vaccine  Discontinued   Zoster Vaccines- Shingrix  Discontinued    Chart Review Today: I personally reviewed active problem list, medication list, allergies, family history, social history, health maintenance, notes from last encounter, lab results, imaging with the patient/caregiver today.   Review of Systems  Constitutional: Negative.   HENT: Negative.    Eyes: Negative.   Respiratory: Negative.    Cardiovascular: Negative.   Gastrointestinal: Negative.   Endocrine: Negative.   Genitourinary: Negative.   Musculoskeletal: Negative.   Skin: Negative.   Allergic/Immunologic: Negative.   Neurological: Negative.   Hematological: Negative.   Psychiatric/Behavioral: Negative.    All other systems reviewed and are negative.    Objective:   Vitals:   01/10/23 1349  BP: 122/74  Pulse: 68  Resp: 16  Temp: 97.7 F (36.5 C)  TempSrc: Oral  SpO2: 96%  Weight: 154 lb 9.6 oz (70.1 kg)  Height: '5\' 3"'$  (1.6 m)    Body mass index is 27.39 kg/m.  Physical Exam Vitals and nursing note reviewed.  Constitutional:      General: She is not in acute distress.    Appearance: Normal appearance. She  is well-developed. She is not ill-appearing, toxic-appearing or diaphoretic.   HENT:     Head: Normocephalic and atraumatic.     Nose: Nose normal.  Eyes:     General:        Right eye: No discharge.        Left eye: No discharge.     Conjunctiva/sclera: Conjunctivae normal.  Neck:     Trachea: No tracheal deviation.  Cardiovascular:     Rate and Rhythm: Normal rate and regular rhythm.     Pulses: Normal pulses.     Heart sounds: Normal heart sounds.  Pulmonary:     Effort: Pulmonary effort is normal. No respiratory distress.     Breath sounds: Normal breath sounds. No stridor.  Musculoskeletal:        General: Normal range of motion.  Skin:    General: Skin is warm and dry.     Findings: No rash.  Neurological:     Mental Status: She is alert.     Motor: No abnormal muscle tone.     Coordination: Coordination normal.  Psychiatric:        Mood and Affect: Mood normal.        Behavior: Behavior normal.         Assessment & Plan:   Problem List Items Addressed This Visit       Cardiovascular and Mediastinum   Primary hypertension - Primary    BP at goal today on current management, stable Atenolol and HCTZ BP Readings from Last 3 Encounters:  01/10/23 122/74  10/13/22 118/68  10/11/22 130/74        Relevant Orders   COMPLETE METABOLIC PANEL WITH GFR   Coronary artery disease involving native heart    She needs to start repatha, otherwise on BB, HCTZ, seeing cardiology now, will be due for f/up in Dec        Respiratory   Moderate persistent asthma without complication    Symbicort and albuterol, recently more symptoms with triggers like fire/smoke burning things outside No recent flares Encouraged her to avoid triggers, keep allergy medications in system regularly for spring, continue singulair, start symbicort 2 puffs BID if any worsening sx       Relevant Medications   montelukast (SINGULAIR) 10 MG tablet     Nervous and Auditory   Meniere's disease of both ears    Managed with diet/lifestyle and meclizine I reviewed more  effective tx with diet/meds that may be options since we want to minimize meds like meclizine due to sedation and pts age      Relevant Medications   meclizine (ANTIVERT) 25 MG tablet     Musculoskeletal and Integument   Osteoporosis, post-menopausal     Genitourinary   Stage 3a chronic kidney disease ([place])     Other   Hx of myocardial infarction    Per cardiology now monitoring pt at least annually      Hx of completed stroke    Needs to improve LDL control, unable to take statins, encouraged to start repatha On asa BP is well controlled 1987 was first stroke, most recent 2015       Hyperlipidemia    Lab Results  Component Value Date   CHOL 249 (H) 07/12/2022   HDL 48 (L) 07/12/2022   LDLCALC 170 (H) 07/12/2022   TRIG 161 (H) 07/12/2022   CHOLHDL 5.2 (H) 07/12/2022  She worked on some diet changes and wants to see improvement and is fasting  today Did not start repatha Encouraged her to start after labs, will be due for repeat lipids and LFTs 3 month after starting per cardiology       Relevant Orders   COMPLETE METABOLIC PANEL WITH GFR   Lipid panel   GAD (generalized anxiety disorder)    Sx currently well controlled and stable with SSRI and SW/therapist    10/11/2022    3:14 PM 07/12/2022    2:36 PM 05/31/2021    1:54 PM 11/27/2020   12:25 PM  GAD 7 : Generalized Anxiety Score  Nervous, Anxious, on Edge '1 2 1 1  '$ Control/stop worrying '1 2 1 '$ 0  Worry too much - different things '1 2 1 '$ 0  Trouble relaxing '1 2 1 1  '$ Restless 1 2 0 0  Easily annoyed or irritable 0 0 0 0  Afraid - awful might happen 0 0 1 0  Total GAD 7 Score '5 10 5 2  '$ Anxiety Difficulty Not difficult at all Very difficult Somewhat difficult Not difficult at all         Relevant Medications   FLUoxetine (PROZAC) 20 MG tablet   Current mild episode of major depressive disorder, unspecified whether recurrent ([facility])    On prozac 20 Mood good    01/10/2023    1:48 PM 10/11/2022    2:59 PM  08/25/2022    2:30 PM  Depression screen PHQ 2/9  Decreased Interest 0 1 0  Down, Depressed, Hopeless 0 1 1  PHQ - 2 Score 0 2 1  Altered sleeping 0 0   Tired, decreased energy 0 2   Change in appetite 0 0   Feeling bad or failure about yourself  0 1   Trouble concentrating 0 0   Moving slowly or fidgety/restless 0 0   Suicidal thoughts 0 0   PHQ-9 Score 0 5   Difficult doing work/chores Not difficult at all Somewhat difficult   Phq neg reviewed today       Relevant Medications   FLUoxetine (PROZAC) 20 MG tablet   Statin intolerance    She consulted with cardiology they prescribed repatha - but she hasn't started it yet      Relevant Orders   COMPLETE METABOLIC PANEL WITH GFR   Lipid panel   Environmental and seasonal allergies    Encouraged good compliance to second-generation antihistamine, intranasal steroid sprays and Singulair      Other Visit Diagnoses     Chronic vertigo       refill on meds, difficult assessment with worsening recent vertigo and history of Mnire's   Relevant Medications   meclizine (ANTIVERT) 25 MG tablet   Exacerbation of asthma, unspecified asthma severity, unspecified whether persistent       wheezy, steroid burst, trial of trelegy, discussed control of asthma, close f/up   Relevant Medications   montelukast (SINGULAIR) 10 MG tablet   Situational stress       Relevant Medications   FLUoxetine (PROZAC) 20 MG tablet        Health Maintenance  Topic Date Due   Mammogram  Never done   DEXA scan (bone density measurement)  Never done   Flu Shot  01/29/2023*   Medicare Annual Wellness Visit  04/06/2023   Colon Cancer Screening  10/05/2023   DTaP/Tdap/Td vaccine (2 - Td or Tdap) 09/25/2028   Hepatitis C Screening: USPSTF Recommendation to screen - Ages 77-79 yo.  Completed   HPV Vaccine  Aged Out   Pneumonia  Vaccine  Discontinued   COVID-19 Vaccine  Discontinued   Zoster (Shingles) Vaccine  Discontinued  *Topic was postponed. The  date shown is not the original due date.   Patient's bone density and mammogram were previously ordered she was given the contact information to call and schedule today strongly encouraged her to do this in the next month  Return in about 6 months (around 07/13/2023) for Routine follow-up.   Delsa Grana, PA-C 01/10/23 2:05 PM

## 2023-01-11 LAB — COMPLETE METABOLIC PANEL WITH GFR
AG Ratio: 1.8 (calc) (ref 1.0–2.5)
ALT: 22 U/L (ref 6–29)
AST: 19 U/L (ref 10–35)
Albumin: 4.4 g/dL (ref 3.6–5.1)
Alkaline phosphatase (APISO): 80 U/L (ref 37–153)
BUN/Creatinine Ratio: 25 (calc) — ABNORMAL HIGH (ref 6–22)
BUN: 29 mg/dL — ABNORMAL HIGH (ref 7–25)
CO2: 27 mmol/L (ref 20–32)
Calcium: 9.8 mg/dL (ref 8.6–10.4)
Chloride: 103 mmol/L (ref 98–110)
Creat: 1.18 mg/dL — ABNORMAL HIGH (ref 0.60–1.00)
Globulin: 2.5 g/dL (calc) (ref 1.9–3.7)
Glucose, Bld: 93 mg/dL (ref 65–99)
Potassium: 5.2 mmol/L (ref 3.5–5.3)
Sodium: 141 mmol/L (ref 135–146)
Total Bilirubin: 0.5 mg/dL (ref 0.2–1.2)
Total Protein: 6.9 g/dL (ref 6.1–8.1)
eGFR: 49 mL/min/{1.73_m2} — ABNORMAL LOW (ref >=60)

## 2023-01-11 LAB — LIPID PANEL
Cholesterol: 250 mg/dL — ABNORMAL HIGH (ref <200)
HDL: 53 mg/dL (ref >=50)
LDL Cholesterol (Calc): 171 mg/dL (calc) — ABNORMAL HIGH
Non-HDL Cholesterol (Calc): 197 mg/dL (calc) — ABNORMAL HIGH (ref <130)
Total CHOL/HDL Ratio: 4.7 (calc) (ref <5.0)
Triglycerides: 127 mg/dL (ref <150)

## 2023-01-23 ENCOUNTER — Telehealth: Payer: Self-pay | Admitting: Family Medicine

## 2023-01-23 DIAGNOSIS — Z1231 Encounter for screening mammogram for malignant neoplasm of breast: Secondary | ICD-10-CM

## 2023-01-23 DIAGNOSIS — Z780 Asymptomatic menopausal state: Secondary | ICD-10-CM

## 2023-01-23 NOTE — Telephone Encounter (Unsigned)
Copied from [facility] [id number]. Topic: Referral - Request for Referral >> Jan 23, 2023  4:21 PM Ludger Nutting wrote: Patient states that she needs a new referral for a mammogram and bone density test. Please advise.

## 2023-01-24 NOTE — Telephone Encounter (Signed)
New orders placed and left vm new orders were placed.

## 2023-02-06 ENCOUNTER — Other Ambulatory Visit: Payer: Self-pay | Admitting: Family Medicine

## 2023-04-05 ENCOUNTER — Inpatient Hospital Stay: Admission: RE | Admit: 2023-04-05 | Payer: Medicare Other | Source: Ambulatory Visit

## 2023-04-05 ENCOUNTER — Other Ambulatory Visit: Payer: Medicare Other

## 2023-04-20 ENCOUNTER — Ambulatory Visit (INDEPENDENT_AMBULATORY_CARE_PROVIDER_SITE_OTHER): Payer: Medicare Other

## 2023-04-20 VITALS — Ht 60.0 in | Wt 150.0 lb

## 2023-04-20 DIAGNOSIS — Z0000 Encounter for general adult medical examination without abnormal findings: Secondary | ICD-10-CM

## 2023-04-20 DIAGNOSIS — Z1211 Encounter for screening for malignant neoplasm of colon: Secondary | ICD-10-CM

## 2023-04-20 NOTE — Progress Notes (Signed)
Subjective:   Tricia Vasquez is a 72 y.o. female who presents for Medicare Annual (Subsequent) preventive examination.  Visit Complete: Virtual  I connected with  [name] on 04/20/23 by a audio enabled telemedicine application and verified that I am speaking with the correct person using two identifiers.  Patient Location: Home  Provider Location: Office/Clinic  I discussed the limitations of evaluation and management by telemedicine. The patient expressed understanding and agreed to proceed.  Patient Medicare AWV questionnaire was completed by the patient on (not done); I have confirmed that all information answered by patient is correct and no changes since this date.  Review of Systems    Cardiac Risk Factors include: advanced age (>62men, >60 women);hypertension     Objective:    Today's Vitals   04/20/23 1419  Weight: 150 lb (68 kg)  Height: 5' (1.524 m)   Body mass index is 29.29 kg/m.     04/20/2023    2:32 PM 04/05/2022    2:21 PM 04/01/2021    2:36 PM 10/04/2018   10:25 AM 03/24/2016    2:43 PM  Advanced Directives  Does Patient Have a Medical Advance Directive? No No No No No  Does patient want to make changes to medical advance directive?  Yes (MAU/Ambulatory/Procedural Areas - Information given)     Would patient like information on creating a medical advance directive?   Yes (MAU/Ambulatory/Procedural Areas - Information given) Yes (MAU/Ambulatory/Procedural Areas - Information given) No - patient declined information    Current Medications (verified) Outpatient Encounter Medications as of 04/20/2023  Medication Sig   albuterol (VENTOLIN HFA) 108 (90 Base) MCG/ACT inhaler INHALE 2 PUFFS INTO THE LUNGS EVERY 4 HOURS AS NEEDED FOR WHEEZE OR FOR SHORTNESS OF BREATH   aspirin EC 81 MG tablet Take 81 mg by mouth 2 (two) times daily.   atenolol (TENORMIN) 50 MG tablet TAKE 1 TABLET BY MOUTH EVERY DAY   Carboxymethylcellulose Sodium (EYE DROPS OP) Apply to  eye daily.   Cholecalciferol (VITAMIN D) 2000 units tablet Take 2,000 Units by mouth daily.   COLLAGEN PO Take 1 Scoop by mouth 4 (four) times a week. Native Path Collagen   Cyanocobalamin (VITAMIN B-12) 5000 MCG SUBL Place 1 tablet under the tongue 3 (three) times a week.   Evolocumab (REPATHA SURECLICK) 140 MG/ML SOAJ Inject 140 mg into the skin every 14 (fourteen) days. (Patient not taking: Reported on 01/10/2023)   fexofenadine (ALLEGRA) 180 MG tablet TAKE 1 TABLET BY MOUTH EVERY DAY   FLUoxetine (PROZAC) 20 MG tablet Take 1 tablet (20 mg total) by mouth daily.   fluticasone (FLONASE) 50 MCG/ACT nasal spray USE 2 SPRAYS IN EACH NOSTRIL ONCE DAILY   fluticasone-salmeterol (WIXELA INHUB) 250-50 MCG/ACT AEPB Inhale 1 puff into the lungs in the morning and at bedtime.   GARLIC OIL PO Take 1 pg/oz/day by mouth once.   hydrochlorothiazide (HYDRODIURIL) 25 MG tablet TAKE 1 TABLET BY MOUTH EVERY DAY   meclizine (ANTIVERT) 25 MG tablet TAKE 1 TABLET BY MOUTH 3 TIMES DAILY AS NEEDED FOR DIZZINESS.   Milk Thistle 250 MG CAPS Take 1 capsule by mouth once.   Misc Natural Products (DANDELION ROOT PO) Take 1 tablet by mouth once.   montelukast (SINGULAIR) 10 MG tablet TAKE 1 TABLET BY MOUTH EVERYDAY AT BEDTIME   Multiple Vitamins-Minerals (LIVER DETOX PO) Take by mouth daily.   Multiple Vitamins-Minerals (MULTIVITAMIN WOMENS 50+ ADV PO) Take by mouth.   nitroGLYCERIN (NITROSTAT) 0.4 MG SL tablet TAKE  1 TAB UNDER THE TONGUE EVERY 5 MIN AS NEEDED FOR CHEST PAIN. CALL 911. MAX 3 PER EPISODE   OVER THE COUNTER MEDICATION Cream of tartar 3 times per month   Pseudoephedrine-guaiFENesin (MUCINEX D PO) Take by mouth daily. Daytime/Night   TURMERIC PO Take 1 tablet by mouth once.   No facility-administered encounter medications on file as of 04/20/2023.    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   Osteoporosis    Sleep apnea    No CPAP   Stroke (HCC) 2013   Past Surgical History:  Procedure Laterality Date   ABDOMINAL HYSTERECTOMY     COLON SURGERY     COLONOSCOPY WITH PROPOFOL N/A 10/04/2018   Procedure: COLONOSCOPY WITH PROPOFOL;  Surgeon: [name], MD;  Location: [facility];  Service: Endoscopy;  Laterality: N/A;  sleep apnea   OVARIAN CYST REMOVAL     POLYPECTOMY  10/04/2018   Procedure: POLYPECTOMY;  Surgeon: [name], MD;  Location: [facility];  Service: Endoscopy;;   TONSILLECTOMY     Family History  Problem Relation Age of Onset   Hypertension Mother    Hypertension Father    Hypertension Sister    Hypertension Brother    Social History   Socioeconomic History   Marital status: Divorced    Spouse name: Not on file   Number of children: Not on file   Years of education: Not on file   Highest education level: Not on file  Occupational History   Not on file  Tobacco Use   Smoking status: Former    Types: Cigarettes    Quit date: 1970    Years since quitting: 54.5   Smokeless tobacco: Never  Vaping Use   Vaping Use: Never used  Substance and Sexual Activity   Alcohol use: No    Alcohol/week: 0.0 standard drinks of alcohol    Comment: may have a drink 1x/mo   Drug use: No   Sexual activity: Not Currently  Other Topics Concern   Not on file  Social History Narrative   Pt lives alone    Social Determinants of Health   Financial Resource Strain: Low Risk  (04/20/2023)   Overall Financial Resource Strain (CARDIA)    Difficulty of Paying Living Expenses: Not hard at all  Food Insecurity: No Food Insecurity (04/20/2023)   Hunger Vital Sign    Worried About Running Out of Food in the Last Year: Never true    Ran Out of Food in the Last  Year: Never true  Transportation Needs: No Transportation Needs (04/20/2023)   PRAPARE - Administrator, Civil Service (Medical): No    Lack of Transportation (Non-Medical): No  Physical Activity: Sufficiently Active (04/20/2023)   Exercise Vital Sign    Days of Exercise per Week: 7 days    Minutes of Exercise per Session: 30 min  Stress: No Stress Concern Present (04/20/2023)   [employer] of Occupational Health - Occupational Stress Questionnaire    Feeling of Stress : Not at all  Social Connections: Socially Isolated (04/20/2023)   Social Connection and Isolation Panel [NHANES]  Frequency of Communication with Friends and Family: More than three times a week    Frequency of Social Gatherings with Friends and Family: Once a week    Attends Religious Services: Never    Database administrator or Organizations: No    Attends Engineer, structural: Never    Marital Status: Divorced    Tobacco Counseling Counseling given: Not Answered   Clinical Intake:  Pre-visit preparation completed: Yes  Pain : No/denies pain   BMI - recorded: 29.29 Nutritional Status: BMI 25 -29 Overweight Nutritional Risks: None Diabetes: No  How often do you need to have someone help you when you read instructions, pamphlets, or other written materials from your doctor or pharmacy?: 1 - Never  Interpreter Needed?: No  Comments: lives alone Information entered by :: B.Ashani Pumphrey,LPN   Activities of Daily Living    04/20/2023    2:32 PM 01/10/2023    1:48 PM  In your present state of health, do you have any difficulty performing the following activities:  Hearing? 0 0  Vision? 0 0  Difficulty concentrating or making decisions? 0 0  Walking or climbing stairs? 0 0  Dressing or bathing? 0 0  Doing errands, shopping? 0 0  Preparing Food and eating ? N   Using the Toilet? N   In the past six months, have you accidently leaked urine? N   Do you have problems with loss of  bowel control? N   Managing your Medications? N   Managing your Finances? N   Housekeeping or managing your Housekeeping? N     Patient Care Team: Danelle Berry, PA-C as PCP - General (Family Medicine) Debbe Odea, MD as PCP - Cardiology (Cardiology) Morene Crocker, MD as Referring Physician (Neurology) Gaspar Cola, RPH (Inactive) (Pharmacist) Wenda Overland, LCSW as Social Worker Juanell Fairly, RN as Case Manager  Indicate any recent Medical Services you may have received from other than [facility] providers in the past year (date may be approximate).     Assessment:   This is a routine wellness examination for [place].  Hearing/Vision screen Hearing Screening - Comments:: Adequate hearing Vision Screening - Comments:: Adequate vision w/glasses [facility]  Dietary issues and exercise activities discussed:     Goals Addressed   None    Depression Screen    04/20/2023    2:30 PM 01/10/2023    1:48 PM 10/11/2022    2:59 PM 08/25/2022    2:30 PM 08/03/2022    1:31 PM 07/12/2022    2:36 PM 04/05/2022    2:20 PM  PHQ 2/9 Scores  PHQ - 2 Score 0 0 2 1 1 6  0  PHQ- 9 Score  0 5   15     Fall Risk    04/20/2023    2:23 PM 01/10/2023    1:48 PM 10/11/2022    2:59 PM 07/12/2022    2:36 PM 04/05/2022    2:22 PM  Fall Risk   Falls in the past year? 0 0 0 0 0  Number falls in past yr: 0 0 0 0 0  Injury with Fall? 0 0 0 0 0  Risk for fall due to : No Fall Risks No Fall Risks No Fall Risks No Fall Risks No Fall Risks  Follow up Education provided;Falls prevention discussed Falls prevention discussed;Education provided;Falls evaluation completed Falls prevention discussed;Education provided;Falls evaluation completed Falls prevention discussed;Education provided Falls prevention discussed    MEDICARE RISK AT HOME:  Medicare Risk at Home - 04/20/23 1424     Any stairs in or around the home? No    If so, are there any without handrails? No    Home free of loose  throw rugs in walkways, pet beds, electrical cords, etc? Yes    Adequate lighting in your home to reduce risk of falls? Yes    Life alert? No    Use of a cane, walker or w/c? Yes   cane   Grab bars in the bathroom? No    Shower chair or bench in shower? No    Elevated toilet seat or a handicapped toilet? No             TIMED UP AND GO:  Was the test performed?  No    Cognitive Function:        04/20/2023    2:33 PM  6CIT Screen  What Year? 0 points  What month? 0 points  What time? 0 points  Count back from 20 0 points  Months in reverse 0 points  Repeat phrase 0 points  Total Score 0 points    Immunizations Immunization History  Administered Date(s) Administered   Tdap 09/25/2018    TDAP status: Up to date  Flu Vaccine status: Declined, Education has been provided regarding the importance of this vaccine but patient still declined. Advised may receive this vaccine at local pharmacy or Health Dept. Aware to provide a copy of the vaccination record if obtained from local pharmacy or Health Dept. Verbalized acceptance and understanding.  Pneumococcal vaccine status: Declined,  Education has been provided regarding the importance of this vaccine but patient still declined. Advised may receive this vaccine at local pharmacy or Health Dept. Aware to provide a copy of the vaccination record if obtained from local pharmacy or Health Dept. Verbalized acceptance and understanding.   Covid-19 vaccine status: Completed vaccines  Qualifies for Shingles Vaccine? Yes   Zostavax completed No   Shingrix Completed?: No.    Education has been provided regarding the importance of this vaccine. Patient has been advised to call insurance company to determine out of pocket expense if they have not yet received this vaccine. Advised may also receive vaccine at local pharmacy or Health Dept. Verbalized acceptance and understanding.  Screening Tests Health Maintenance  Topic Date Due    MAMMOGRAM  Never done   DEXA SCAN  Never done   INFLUENZA VACCINE  06/01/2023   Colonoscopy  10/05/2023   Medicare Annual Wellness (AWV)  04/19/2024   DTaP/Tdap/Td (2 - Td or Tdap) 09/25/2028   Hepatitis C Screening  Completed   HPV VACCINES  Aged Out   Pneumonia Vaccine 36+ Years old  Discontinued   COVID-19 Vaccine  Discontinued   Zoster Vaccines- Shingrix  Discontinued    Health Maintenance  Health Maintenance Due  Topic Date Due   MAMMOGRAM  Never done   DEXA SCAN  Never done    Colorectal cancer screening: Referral to GI placed yes. Pt aware the office will call re: appt.  Mammogram status: Completed no. Repeat every year and Dexa Scan appt next week   Lung Cancer Screening: (Low Dose CT Chest recommended if Age 36-80 years, 20 pack-year currently smoking OR have quit w/in 15years.) does not qualify.   Lung Cancer Screening Referral: no  Additional Screening:  Hepatitis C Screening: does not qualify; Completed yes  Vision Screening: Recommended annual ophthalmology exams for early detection of glaucoma and other disorders of the eye.  Is the patient up to date with their annual eye exam?  Yes  Who is the provider or what is the name of the office in which the patient attends annual eye exams? [facility] If pt is not established with a provider, would they like to be referred to a provider to establish care? No .   Dental Screening: Recommended annual dental exams for proper oral hygiene  Diabetic Foot Exam: n/a  Community Resource Referral / Chronic Care Management: CRR required this visit?  No   CCM required this visit?  No     Plan:     I have personally reviewed and noted the following in the patient's chart:   Medical and social history Use of alcohol, tobacco or illicit drugs  Current medications and supplements including opioid prescriptions. Patient is not currently taking opioid prescriptions. Functional ability and status Nutritional  status Physical activity Advanced directives List of other physicians Hospitalizations, surgeries, and ER visits in previous 12 months Vitals Screenings to include cognitive, depression, and falls Referrals and appointments  In addition, I have reviewed and discussed with patient certain preventive protocols, quality metrics, and best practice recommendations. A written personalized care plan for preventive services as well as general preventive health recommendations were provided to patient.     Sue Lush, LPN   [id number]/61/0960   After Visit Summary: (MyChart) Due to this being a telephonic visit, the after visit summary with patients personalized plan was offered to patient via MyChart   Nurse Notes: The patient states she is doing well and has no concerns or questions at this time. Pt says she is taking homeopathic supplements to lower her cholesterol. Placed on med list: Krill Oil 2,000 mcg daily L-citrulline 750mg  daily L-arginine 100mg

## 2023-04-20 NOTE — Patient Instructions (Signed)
Tricia Vasquez , Thank you for taking time to come for your Medicare Wellness Visit. I appreciate your ongoing commitment to your health goals. Please review the following plan we discussed and let me know if I can assist you in the future.   These are the goals we discussed:  Goals      CCM (CHRONIC KIDNEY DISEASE) EXPECTED OUTCOME:  MONITOR, SELF- MANAGE AND REDUCE SYMPTOMS OF CHRONIC KIDNEY DISEASE     Current Barriers:  Chronic Disease Management support and education needs related to Chronic Kidney Disease Non-adherence to prescribed medication regimen   Lab Results  Component Value Date   CREATININE 1.10 (H) 07/12/2022    Lab Results  Component Value Date   BUN 34 (H) 07/12/2022     Planned Interventions: Assessed the patient's understanding of chronic kidney disease    Evaluation of current treatment plan related to chronic kidney disease self management and patient's adherence to plan as established by provider      Provided education on kidney disease progression    Provided education to patient re: stroke prevention, s/s of heart attack and stroke    Reviewed prescribed diet Reviewed medications with patient and discussed importance of compliance. Expressed concerns that she is usually alone and may experience side effects. She prefers not to start the new medication until everything is settled. Advised to take as prescribed and notify PCP if unable to tolerate. Screening for signs and symptoms of depression related to chronic disease state     Reviewed scheduled/upcoming provider appointments. Advised to attend appointments as scheduled to avoid delays in care.    Symptom Management: Take medications as prescribed   Attend all scheduled provider appointments Call provider office for new concerns or questions  Notify PCP if experiencing side effects with new medication  Follow Up Plan:  Will follow up in November       CCM Expected Outcome:  Monitor, Self-Manage, and  Reduce Symptoms of Hypertension     Current Barriers:  Chronic Disease Management support and education needs related to Hypertension Management  Planned Interventions: Reviewed medications and indications for use. Reports taking BP medications as prescribed.  Provided information regarding established blood pressure parameters along with indications for notifying a provider. Reports monitoring BP at home. Reports readings have been within range. Advised to monitor consistently and record readings.  Reviewed recent cardiac symptoms. Patient with hx of MI and stroke. Denies recent episodes of chest discomfort, palpitations, or shortness of breath. Denies recent episodes of headaches, dizziness of visual changes. Cholesterol/Lipid panel is not within goal. Patient reports not starting cholesterol medications as prescribed. Reports currently being busy moving items from her late sister's home. Expressed concerns that she is usually alone and may experience side effects. She prefers not to start the new medication until everything is settled. Advised to take as prescribed and notify PCP if unable to tolerate. Discussed activity tolerance. Reports engaging in exercise and yoga regularly. Reports tolerating mild and moderate activity without complications.  Discussed compliance with recommended cardiac prudent diet. Encouraged to read nutrition labels, monitor sodium intake, and avoid highly processed foods when possible. Discussed current cholesterol levels and need to decrease intake of foods high in cholesterol and saturated fats. Discussed complications of uncontrolled blood pressure.  Reviewed s/sx of heart attack, stroke and worsening symptoms that require immediate medical attention.   Symptom Management/Patient Goals: Check blood pressure daily Keep a blood pressure log Take blood pressure log to all doctor appointments Notify provider for  blood pressure readings outside of established  range Take medications for blood pressure exactly as prescribed Continue exercise program Report new symptoms to your doctor Continue reading nutrition labels Limit intake of sodium, cholesterol and saturated fats Attend doctor appointments as scheduled  Follow Up Plan:  Will follow up in November       Mental Health Resources     Care Coordination Interventions: Able to get things from sisters home , Phone call to patient to provide mental health support and to provide resources Patient continuing to  settle her affairs-working on cleaning out family home Continued encouragement to prioritize herself-contact information for [facility] provided for ongoing follow up [phone]  Patient agreeable to calling [facility] for counseling once her families estate is settled Active listening / Reflection utilized  Industrial/product designer Provided Participation in counseling encouraged  Patient agreeable to mental health follow up and will call to schedule appointment      Weight (lb) < 140 lb (63.5 kg)     Pt states she would like to lose weight over the next year with healthy eating and physical activity         This is a list of the screening recommended for you and due dates:  Health Maintenance  Topic Date Due   Mammogram  Never done   DEXA scan (bone density measurement)  Never done   Flu Shot  06/01/2023   Colon Cancer Screening  10/05/2023   Medicare Annual Wellness Visit  04/19/2024   DTaP/Tdap/Td vaccine (2 - Td or Tdap) 09/25/2028   Hepatitis C Screening  Completed   HPV Vaccine  Aged Out   Pneumonia Vaccine  Discontinued   COVID-19 Vaccine  Discontinued   Zoster (Shingles) Vaccine  Discontinued    Advanced directives: no  Conditions/risks identified: none  Next appointment: Follow up in one year for your annual wellness visit 04/25/2024 @ 2pm telephone   Preventive Care 65 Years and Older, Female Preventive care refers to lifestyle choices and visits with your  health care provider that can promote health and wellness. What does preventive care include? A yearly physical exam. This is also called an annual well check. Dental exams once or twice a year. Routine eye exams. Ask your health care provider how often you should have your eyes checked. Personal lifestyle choices, including: Daily care of your teeth and gums. Regular physical activity. Eating a healthy diet. Avoiding tobacco and drug use. Limiting alcohol use. Practicing safe sex. Taking low-dose aspirin every day. Taking vitamin and mineral supplements as recommended by your health care provider. What happens during an annual well check? The services and screenings done by your health care provider during your annual well check will depend on your age, overall health, lifestyle risk factors, and family history of disease. Counseling  Your health care provider may ask you questions about your: Alcohol use. Tobacco use. Drug use. Emotional well-being. Home and relationship well-being. Sexual activity. Eating habits. History of falls. Memory and ability to understand (cognition). Work and work Astronomer. Reproductive health. Screening  You may have the following tests or measurements: Height, weight, and BMI. Blood pressure. Lipid and cholesterol levels. These may be checked every 5 years, or more frequently if you are over 24 years old. Skin check. Lung cancer screening. You may have this screening every year starting at age 75 if you have a 30-pack-year history of smoking and currently smoke or have quit within the past 15 years. Fecal occult blood test (FOBT) of the stool.  You may have this test every year starting at age 4. Flexible sigmoidoscopy or colonoscopy. You may have a sigmoidoscopy every 5 years or a colonoscopy every 10 years starting at age 35. Hepatitis C blood test. Hepatitis B blood test. Sexually transmitted disease (STD) testing. Diabetes screening. This  is done by checking your blood sugar (glucose) after you have not eaten for a while (fasting). You may have this done every 1-3 years. Bone density scan. This is done to screen for osteoporosis. You may have this done starting at age 29. Mammogram. This may be done every 1-2 years. Talk to your health care provider about how often you should have regular mammograms. Talk with your health care provider about your test results, treatment options, and if necessary, the need for more tests. Vaccines  Your health care provider may recommend certain vaccines, such as: Influenza vaccine. This is recommended every year. Tetanus, diphtheria, and acellular pertussis (Tdap, Td) vaccine. You may need a Td booster every 10 years. Zoster vaccine. You may need this after age 71. Pneumococcal 13-valent conjugate (PCV13) vaccine. One dose is recommended after age 29. Pneumococcal polysaccharide (PPSV23) vaccine. One dose is recommended after age 27. Talk to your health care provider about which screenings and vaccines you need and how often you need them. This information is not intended to replace advice given to you by your health care provider. Make sure you discuss any questions you have with your health care provider. Document Released: 11/13/2015 Document Revised: 07/06/2016 Document Reviewed: 08/18/2015 Elsevier Interactive Patient Education  2017 [employer].  Fall Prevention in the Home Falls can cause injuries. They can happen to people of all ages. There are many things you can do to make your home safe and to help prevent falls. What can I do on the outside of my home? Regularly fix the edges of walkways and driveways and fix any cracks. Remove anything that might make you trip as you walk through a door, such as a raised step or threshold. Trim any bushes or trees on the path to your home. Use bright outdoor lighting. Clear any walking paths of anything that might make someone trip, such as  rocks or tools. Regularly check to see if handrails are loose or broken. Make sure that both sides of any steps have handrails. Any raised decks and porches should have guardrails on the edges. Have any leaves, snow, or ice cleared regularly. Use sand or salt on walking paths during winter. Clean up any spills in your garage right away. This includes oil or grease spills. What can I do in the bathroom? Use night lights. Install grab bars by the toilet and in the tub and shower. Do not use towel bars as grab bars. Use non-skid mats or decals in the tub or shower. If you need to sit down in the shower, use a plastic, non-slip stool. Keep the floor dry. Clean up any water that spills on the floor as soon as it happens. Remove soap buildup in the tub or shower regularly. Attach bath mats securely with double-sided non-slip rug tape. Do not have throw rugs and other things on the floor that can make you trip. What can I do in the bedroom? Use night lights. Make sure that you have a light by your bed that is easy to reach. Do not use any sheets or blankets that are too big for your bed. They should not hang down onto the floor. Have a firm chair that has  side arms. You can use this for support while you get dressed. Do not have throw rugs and other things on the floor that can make you trip. What can I do in the kitchen? Clean up any spills right away. Avoid walking on wet floors. Keep items that you use a lot in easy-to-reach places. If you need to reach something above you, use a strong step stool that has a grab bar. Keep electrical cords out of the way. Do not use floor polish or wax that makes floors slippery. If you must use wax, use non-skid floor wax. Do not have throw rugs and other things on the floor that can make you trip. What can I do with my stairs? Do not leave any items on the stairs. Make sure that there are handrails on both sides of the stairs and use them. Fix handrails  that are broken or loose. Make sure that handrails are as long as the stairways. Check any carpeting to make sure that it is firmly attached to the stairs. Fix any carpet that is loose or worn. Avoid having throw rugs at the top or bottom of the stairs. If you do have throw rugs, attach them to the floor with carpet tape. Make sure that you have a light switch at the top of the stairs and the bottom of the stairs. If you do not have them, ask someone to add them for you. What else can I do to help prevent falls? Wear shoes that: Do not have high heels. Have rubber bottoms. Are comfortable and fit you well. Are closed at the toe. Do not wear sandals. If you use a stepladder: Make sure that it is fully opened. Do not climb a closed stepladder. Make sure that both sides of the stepladder are locked into place. Ask someone to hold it for you, if possible. Clearly mark and make sure that you can see: Any grab bars or handrails. First and last steps. Where the edge of each step is. Use tools that help you move around (mobility aids) if they are needed. These include: Canes. Walkers. Scooters. Crutches. Turn on the lights when you go into a dark area. Replace any light bulbs as soon as they burn out. Set up your furniture so you have a clear path. Avoid moving your furniture around. If any of your floors are uneven, fix them. If there are any pets around you, be aware of where they are. Review your medicines with your doctor. Some medicines can make you feel dizzy. This can increase your chance of falling. Ask your doctor what other things that you can do to help prevent falls. This information is not intended to replace advice given to you by your health care provider. Make sure you discuss any questions you have with your health care provider. Document Released: 08/13/2009 Document Revised: 03/24/2016 Document Reviewed: 11/21/2014 Elsevier Interactive Patient Education  2017 [employer].

## 2023-04-25 ENCOUNTER — Ambulatory Visit
Admission: RE | Admit: 2023-04-25 | Discharge: 2023-04-25 | Disposition: A | Discharge status: Home / Self Care | Payer: Medicare Other | Source: Ambulatory Visit | Attending: Family Medicine | Admitting: Family Medicine

## 2023-04-25 DIAGNOSIS — Z1231 Encounter for screening mammogram for malignant neoplasm of breast: Secondary | ICD-10-CM | POA: Insufficient documentation

## 2023-04-25 DIAGNOSIS — Z780 Asymptomatic menopausal state: Secondary | ICD-10-CM

## 2023-05-15 ENCOUNTER — Ambulatory Visit: Payer: Federal, State, Local not specified - PPO | Admitting: Family Medicine

## 2023-05-17 ENCOUNTER — Ambulatory Visit: Payer: Medicare Other | Admitting: Family Medicine

## 2023-05-25 ENCOUNTER — Encounter: Payer: Self-pay | Admitting: Family Medicine

## 2023-05-25 ENCOUNTER — Telehealth (INDEPENDENT_AMBULATORY_CARE_PROVIDER_SITE_OTHER): Payer: Medicare Other | Admitting: Family Medicine

## 2023-05-25 VITALS — BP 115/61 | HR 57 | Ht 59.5 in | Wt 150.0 lb

## 2023-05-25 DIAGNOSIS — I10 Essential (primary) hypertension: Secondary | ICD-10-CM

## 2023-05-25 DIAGNOSIS — N1831 Chronic kidney disease, stage 3a: Secondary | ICD-10-CM | POA: Diagnosis not present

## 2023-05-25 DIAGNOSIS — M810 Age-related osteoporosis without current pathological fracture: Secondary | ICD-10-CM | POA: Diagnosis not present

## 2023-05-25 DIAGNOSIS — M109 Gout, unspecified: Secondary | ICD-10-CM | POA: Diagnosis not present

## 2023-05-25 DIAGNOSIS — Z5181 Encounter for therapeutic drug level monitoring: Secondary | ICD-10-CM

## 2023-05-25 NOTE — Patient Instructions (Signed)
Alendronate Weekly Tablets What is this medication? ALENDRONATE (a LEN droe nate) treats osteoporosis. It works by Interior and spatial designer stronger and less likely to break (fracture). It belongs to a group of medications called bisphosphonates. This medicine may be used for other purposes; ask your health care provider or pharmacist if you have questions. COMMON BRAND NAME(S): Fosamax What should I tell my care team before I take this medication? They need to know if you have any of these conditions: Bleeding disorder Cancer Dental disease Difficulty swallowing Infection (fever, chills, cough, sore throat, pain or trouble passing urine) Kidney disease Low levels of calcium or other minerals in the blood Low red blood cell counts Receiving steroids like dexamethasone or prednisone Stomach or intestine problems Trouble sitting or standing for 30 minutes An unusual or allergic reaction to alendronate, other medications, foods, dyes or preservatives Pregnant or trying to get pregnant Breast-feeding How should I use this medication? Take this medication by mouth with a full glass of water. Take it as directed on the prescription label at the same day of each week. Take the dose right after waking up. Do not eat or drink anything before taking it. Do not take it with any other drink except water. Do not chew or crush the tablet. After taking it, do not eat breakfast, drink, or take any other medications or vitamins for at least 30 minutes. Sit or stand up for at least 30 minutes after you take it. Do not lie down. Keep taking it unless your care team tells you to stop. A special MedGuide will be given to you by the pharmacist with each prescription and refill. Be sure to read this information carefully each time. Talk to your care team about the use of this medication in children. Special care may be needed. Overdosage: If you think you have taken too much of this medicine contact a poison control  center or emergency room at once. NOTE: This medicine is only for you. Do not share this medicine with others. What if I miss a dose? If you take your medication once a day, skip it. Take your next dose at the scheduled time the next morning. Do not take two doses on the same day. If you take your medication once a week, take the missed dose on the morning after you remember. Do not take two doses on the same day. What may interact with this medication? Aluminum hydroxide Antacids Aspirin Calcium supplements Iron supplements Medications for inflammation like ibuprofen, naproxen, and others Magnesium supplements Vitamins with minerals This list may not describe all possible interactions. Give your health care provider a list of all the medicines, herbs, non-prescription drugs, or dietary supplements you use. Also tell them if you smoke, drink alcohol, or use illegal drugs. Some items may interact with your medicine. What should I watch for while using this medication? Visit your care team for regular checks on your progress. It may be some time before you see the benefit from this medication. Some people who take this medication have severe bone, joint, or muscle pain. This medication may also increase your risk for jaw problems or a broken thigh bone. Tell your care team right away if you have severe pain in your jaw, bones, joints, or muscles. Tell you care team if you have any pain that does not go away or that gets worse. Tell your dentist and dental surgeon that you are taking this medication. You should not have major dental surgery while on  this medication. See your dentist to have a dental exam and fix any dental problems before starting this medication. Take good care of your teeth while on this medication. Make sure you see your dentist for regular follow-up appointments. You should make sure you get enough calcium and vitamin D while you are taking this medication. Discuss the foods you  eat and the vitamins you take with your care team. You may need blood work done while you are taking this medication. What side effects may I notice from receiving this medication? Side effects that you should report to your care team as soon as possible: Allergic reactions--skin rash, itching, hives, swelling of the face, lips, tongue, or throat Low calcium level--muscle pain or cramps, confusion, tingling, or numbness in the hands or feet Osteonecrosis of the jaw--pain, swelling, or redness in the mouth, numbness of the jaw, poor healing after dental work, unusual discharge from the mouth, visible bones in the mouth Pain or trouble swallowing Severe bone, joint, or muscle pain Stomach bleeding--bloody or black, tar-like stools, vomiting blood or brown material that looks like coffee grounds Side effects that usually do not require medical attention (report to your care team if they continue or are bothersome): Constipation Diarrhea Nausea Stomach pain This list may not describe all possible side effects. Call your doctor for medical advice about side effects. You may report side effects to FDA at 1-800-FDA-1088. Where should I keep my medication? Keep out of the reach of children and pets. Store at room temperature between 15 and 30 degrees C (59 and 86 degrees F). Throw away any unused medication after the expiration date. NOTE: This sheet is a summary. It may not cover all possible information. If you have questions about this medicine, talk to your doctor, pharmacist, or health care provider.  2024 Elsevier/Gold Standard (2020-10-12 00:00:00)  Osteoporosis  Osteoporosis is when the bones get thin and weak. This can cause your bones to break (fracture) more easily. What are the causes? The exact cause of this condition is not known. What increases the risk? Having family members with this condition. Not eating enough healthy foods. Taking certain medicines. Being female. Being  age 55 or older. Smoking or using other products that contain nicotine or tobacco, such as e-cigarettes or chewing tobacco. Not exercising. Being of European or Asian ancestry. Having a small body frame. What are the signs or symptoms? A broken bone might be the first sign, especially if the break results from a fall or injury that usually would not cause a bone to break. Other signs and symptoms include: Pain in the neck or low back. Being hunched over (stooped posture). Getting shorter. How is this treated? Eating more foods with more calcium and vitamin D in them. Doing exercises. Stopping tobacco use. Limiting how much alcohol you drink. Taking medicines to slow bone loss or help make the bones stronger. Taking supplements of calcium and vitamin D every day. Taking medicines to replace chemicals in the body (hormone replacement medicines). Monitoring your levels of calcium and vitamin D. The goal of treatment is to strengthen your bones and lower your risk for a bone break. Follow these instructions at home: Eating and drinking Eat plenty of calcium and vitamin D. These nutrients are good for your bones. Good sources of calcium and vitamin D include: Some fish, such as salmon and tuna. Foods that have calcium and vitamin D added to them (fortified foods), such as some breakfast cereals. Egg yolks. Cheese. Liver.  Activity Do exercises as told by your doctor. Ask your doctor what exercises are safe for you. You should do: Exercises that make your muscles work to hold your body weight up (weight-bearing exercises). These include tai chi, yoga, and walking. Exercises to make your muscles stronger. One example is lifting weights. Lifestyle Do not drink alcohol if: Your doctor tells you not to drink. You are pregnant, may be pregnant, or are planning to become pregnant. If you drink alcohol: Limit how much you use to: 0-1 drink a day for women. 0-2 drinks a day for men. Know  how much alcohol is in your drink. In the [place] one drink equals one 12 oz bottle of beer (355 mL), one 5 oz glass of wine (148 mL), or one 1 oz glass of hard liquor (44 mL). Do not smoke or use any products that contain nicotine or tobacco. If you need help quitting, ask your doctor. Preventing falls Use tools to help you move around (mobility aids) as needed. These include canes, walkers, scooters, and crutches. Keep rooms well-lit. Put away things on the floor that could make you trip. These include cords and rugs. Install safety rails on stairs. Install grab bars in bathrooms. Use rubber mats in slippery areas, like bathrooms. Wear shoes that: Fit you well. Support your feet. Have closed toes. Have rubber soles or low heels. Tell your doctor about all of the medicines you are taking. Some medicines can make you more likely to fall. General instructions Take over-the-counter and prescription medicines only as told by your doctor. Keep all follow-up visits. Contact a doctor if: You have not been tested (screened) for osteoporosis and you are: A woman who is age 98 or older. A man who is age 71 or older. Get help right away if: You fall. You get hurt. Summary Osteoporosis happens when your bones get thin and weak. Weak bones can break (fracture) more easily. Eat plenty of calcium and vitamin D. These are good for your bones. Tell your doctor about all of the medicines that you take. This information is not intended to replace advice given to you by your health care provider. Make sure you discuss any questions you have with your health care provider. Document Revised: 04/02/2020 Document Reviewed: 04/02/2020 Elsevier Patient Education  2024 [employer].

## 2023-05-25 NOTE — Progress Notes (Signed)
Name: Tricia Vasquez   MRN: [id number]    DOB: [birth year]   Date:05/25/2023       Progress Note  Subjective:    Chief Complaint  Chief Complaint  Patient presents with   Follow-up    Bone density results showing osteoprosis    I connected with  Tricia Vasquez  on 05/25/23 at 11:20 AM EDT by a video enabled telemedicine application and verified that I am speaking with the correct person using two identifiers.  I discussed the limitations of evaluation and management by telemedicine and the availability of in person appointments. The patient expressed understanding and agreed to proceed. Staff also discussed with the patient that there may be a patient responsible charge related to this service. 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, pt notes she had known weak bones since a fx in 2007 No recent fx or dexa done or in our system - prior care was out of state in [place] She is on vit d supplement 2000IU daily, no past meds, no hx of dysphagia, no jaw or dental issues  CKD stage 3 - last eGFR was 49-50  HTN on atenolol, hydrochlorothiazide  BP reported today per home readings 115/61  She reports recent shingles outbreak and suspected gout to right foot/toe no hx - she did not seek eval for either    Patient Active Problem List   Diagnosis Date Noted   Statin intolerance 01/10/2023   Stage 3a chronic kidney disease (HCC) 01/10/2023   Environmental and seasonal allergies 01/10/2023   S/P abdominal hysterectomy 06/14/2021   Coronary artery disease involving native heart 05/31/2021   Current mild episode of major depressive disorder, unspecified whether recurrent (HCC) 05/31/2021   Benign neoplasm of transverse colon    Benign neoplasm of ascending colon    Cervical radiculopathy 12/22/2017   GAD (generalized anxiety disorder) 09/30/2016   Moderate persistent asthma without  complication 09/30/2016   Apnea, sleep 04/11/2016   Osteoporosis, 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   Primary hypertension 04/11/2016   Hyperlipidemia 04/11/2016   Depression 04/11/2016   Hx of myocardial infarction 03/24/2016   Hx of hepatitis C 03/24/2016   Hx of completed stroke 03/24/2016   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   Headache disorder 08/06/2014    Social History   Tobacco Use   Smoking status: Former    Current packs/day: 0.00    Types: Cigarettes    Quit date: 1970    Years since quitting: 54.6   Smokeless tobacco: Never  Substance Use Topics   Alcohol use: No    Alcohol/week: 0.0 standard drinks of alcohol    Comment: may have a drink 1x/mo     Current Outpatient Medications:    albuterol (VENTOLIN HFA) 108 (90 Base) MCG/ACT inhaler, INHALE 2 PUFFS INTO THE LUNGS EVERY 4 HOURS AS NEEDED FOR WHEEZE OR FOR SHORTNESS OF BREATH, Disp: 6.7 each, Rfl: 5   aspirin EC 81 MG tablet, Take 81 mg by mouth 2 (two) times daily., Disp: , Rfl:    atenolol (TENORMIN) 50 MG tablet, TAKE 1 TABLET BY MOUTH EVERY DAY, Disp: 90 tablet, Rfl: 1   Carboxymethylcellulose Sodium (EYE DROPS OP), Apply to eye daily., Disp: , Rfl:    Cholecalciferol (VITAMIN D) 2000 units tablet, Take 2,000 Units by mouth daily., Disp: , Rfl:    COLLAGEN PO, Take 1 Scoop by mouth 4 (four) times a week. Native Path Collagen, Disp: ,  Rfl:    Cyanocobalamin (VITAMIN B-12) 5000 MCG SUBL, Place 1 tablet under the tongue 3 (three) times a week., Disp: , Rfl:    Evolocumab (REPATHA SURECLICK) 140 MG/ML SOAJ, Inject 140 mg into the skin every 14 (fourteen) days., Disp: 2 mL, Rfl: 6   fexofenadine (ALLEGRA) 180 MG tablet, TAKE 1 TABLET BY MOUTH EVERY DAY, Disp: 90 tablet, Rfl: 3   FLUoxetine (PROZAC) 20 MG tablet, Take 1 tablet (20 mg total) by mouth daily., Disp: 90 tablet, Rfl: 3   fluticasone (FLONASE) 50 MCG/ACT nasal spray, USE 2 SPRAYS IN EACH NOSTRIL ONCE DAILY, Disp: 48 mL, Rfl: 3   fluticasone-salmeterol  (WIXELA INHUB) 250-50 MCG/ACT AEPB, Inhale 1 puff into the lungs in the morning and at bedtime., Disp: 180 each, Rfl: 1   GARLIC OIL PO, Take 1 pg/oz/day by mouth once., Disp: , Rfl:    hydrochlorothiazide (HYDRODIURIL) 25 MG tablet, TAKE 1 TABLET BY MOUTH EVERY DAY, Disp: 90 tablet, Rfl: 3   L-Arginine 1000 MG TABS, Take 1 tablet by mouth 1 day or 1 dose., Disp: , Rfl:    meclizine (ANTIVERT) 25 MG tablet, TAKE 1 TABLET BY MOUTH 3 TIMES DAILY AS NEEDED FOR DIZZINESS., Disp: 60 tablet, Rfl: 5   Milk Thistle 250 MG CAPS, Take 1 capsule by mouth once., Disp: , Rfl:    Misc Natural Products (DANDELION ROOT PO), Take 1 tablet by mouth once., Disp: , Rfl:    montelukast (SINGULAIR) 10 MG tablet, TAKE 1 TABLET BY MOUTH EVERYDAY AT BEDTIME, Disp: 90 tablet, Rfl: 1   Multiple Vitamins-Minerals (LIVER DETOX PO), Take by mouth daily., Disp: , Rfl:    Multiple Vitamins-Minerals (MULTIVITAMIN WOMENS 50+ ADV PO), Take by mouth., Disp: , Rfl:    nitroGLYCERIN (NITROSTAT) 0.4 MG SL tablet, TAKE 1 TAB UNDER THE TONGUE EVERY 5 MIN AS NEEDED FOR CHEST PAIN. CALL 911. MAX 3 PER EPISODE, Disp: 25 tablet, Rfl: 0   Omega-3 Krill Oil 1000 MG CAPS, Take 2 capsules by mouth once., Disp: , Rfl:    OVER THE COUNTER MEDICATION, Cream of tartar 3 times per month, Disp: , Rfl:    Pseudoephedrine-guaiFENesin (MUCINEX D PO), Take by mouth daily. Daytime/Night, Disp: , Rfl:    PURE L-CITRULLINE PO, Take 750 mg by mouth., Disp: , Rfl:    TURMERIC PO, Take 1 tablet by mouth once., Disp: , Rfl:   Allergies  Allergen Reactions   Penicillins Swelling and Rash        Statins Shortness Of Breath    And chest pain   Aspirin Other (See Comments)    Tongue swelling    I personally reviewed active problem list, medication list, allergies, family history, social history, health maintenance, notes from last encounter, lab results, imaging with the patient/caregiver today.   Review of Systems  Constitutional: Negative.   HENT:  Negative.    Eyes: Negative.   Respiratory: Negative.    Cardiovascular: Negative.   Gastrointestinal: Negative.   Endocrine: Negative.   Genitourinary: Negative.   Musculoskeletal: Negative.   Skin: Negative.   Allergic/Immunologic: Negative.   Neurological: Negative.   Hematological: Negative.   Psychiatric/Behavioral: Negative.    All other systems reviewed and are negative.     Objective:   Virtual encounter, vitals limited, only able to obtain the following Today's Vitals   05/25/23 1240  BP: 115/61  Pulse: (!) 57  Weight: 150 lb (68 kg)  Height: 4' 11.5" (1.511 m)   Body mass index is 29.79  kg/m. Nursing Note and Vital Signs reviewed.  Physical Exam Vitals and nursing note reviewed.  Constitutional:      Appearance: Normal appearance.  Pulmonary:     Effort: No respiratory distress.  Neurological:     Mental Status: She is alert.  Psychiatric:        Mood and Affect: Mood normal.        Behavior: Behavior normal.     PE limited by virtual encounter  No results found for this or any previous visit (from the past 72 hour(s)).  Assessment and Plan:     ICD-10-CM   1. Osteoporosis, post-menopausal  M81.0 COMPLETE METABOLIC PANEL WITH GFR    VITAMIN D 25 Hydroxy (Vit-D Deficiency, Fractures)    Parathyroid hormone, intact (no Ca)   known dx for about 15-20 years, no past meds, need labs (see plan below) reviewed her dexa and recommended fosamax    2. Stage 3a chronic kidney disease (HCC)  N18.31 COMPLETE METABOLIC PANEL WITH GFR    VITAMIN D 25 Hydroxy (Vit-D Deficiency, Fractures)   recheck renal function    3. Encounter for medication monitoring  Z51.81 COMPLETE METABOLIC PANEL WITH GFR    Uric acid    VITAMIN D 25 Hydroxy (Vit-D Deficiency, Fractures)    Parathyroid hormone, intact (no Ca)    4. Acute gout of right foot, unspecified cause  M10.9 COMPLETE METABOLIC PANEL WITH GFR    Uric acid   new, right great toe, red hot, severe pain, not able  to bear weight, check uric acid level    5. Primary hypertension  I10    BP home readings at goal, stable, well controlled on current meds      Get labs done in the next 1-2 weeks, no recent PTH/vit D done - would need to check those before starting fosamax Recent and new gout episode - will also grab uric acid level and check renal function  She has not done an in office appt since march so we will want to see her in person in the next 3-5 months for routine f/up  Once labs are checked and she has a chance to look at the fosamax info she will let me know if she is going to start the meds, today we have discussed how to take the drug, contraindications, possible SE and how the drug works. She is interested in strengthening bones and preventing risk of hip fx  - I discussed the assessment and treatment plan with the patient. The patient was provided an opportunity to ask questions and all were answered. The patient agreed with the plan and demonstrated an understanding of the instructions.  I provided 25+ minutes of non-face-to-face time during this encounter.  Danelle Berry, PA-C 05/25/23 12:40 PM

## 2023-07-13 ENCOUNTER — Ambulatory Visit: Payer: Medicare Other | Admitting: Family Medicine

## 2023-08-17 ENCOUNTER — Encounter: Payer: Self-pay | Admitting: *Deleted

## 2023-08-17 ENCOUNTER — Telehealth: Payer: Self-pay | Admitting: Family Medicine

## 2023-08-17 NOTE — Telephone Encounter (Signed)
There are some labs from July and additional you would add?

## 2023-08-17 NOTE — Telephone Encounter (Signed)
No answer from pt left detailed vm.

## 2023-08-17 NOTE — Telephone Encounter (Signed)
Copied from CRM [id number]. Topic: General - Other >> Aug 16, 2023  4:48 PM Turkey B wrote: Reason for CRM: pt wants to have lab work to fu on her kidneys

## 2023-09-05 LAB — COMPLETE METABOLIC PANEL WITH GFR
AG Ratio: 1.9 (calc) (ref 1.0–2.5)
ALT: 19 U/L (ref 6–29)
AST: 20 U/L (ref 10–35)
Albumin: 4.5 g/dL (ref 3.6–5.1)
Alkaline phosphatase (APISO): 83 U/L (ref 37–153)
BUN/Creatinine Ratio: 21 (calc) (ref 6–22)
BUN: 24 mg/dL (ref 7–25)
CO2: 29 mmol/L (ref 20–32)
Calcium: 9.9 mg/dL (ref 8.6–10.4)
Chloride: 102 mmol/L (ref 98–110)
Creat: 1.15 mg/dL — ABNORMAL HIGH (ref 0.60–1.00)
Globulin: 2.4 g/dL (ref 1.9–3.7)
Glucose, Bld: 97 mg/dL (ref 65–99)
Potassium: 4.6 mmol/L (ref 3.5–5.3)
Sodium: 139 mmol/L (ref 135–146)
Total Bilirubin: 0.6 mg/dL (ref 0.2–1.2)
Total Protein: 6.9 g/dL (ref 6.1–8.1)
eGFR: 50 mL/min/{1.73_m2} — ABNORMAL LOW (ref >=60)

## 2023-09-05 LAB — PARATHYROID HORMONE, INTACT (NO CA): PTH: 71 pg/mL (ref 16–77)

## 2023-09-05 LAB — VITAMIN D 25 HYDROXY (VIT D DEFICIENCY, FRACTURES): Vit D, 25-Hydroxy: 29 ng/mL — ABNORMAL LOW (ref 30–100)

## 2023-09-05 LAB — URIC ACID: Uric Acid, Serum: 6.9 mg/dL (ref 2.5–7.0)

## 2023-09-18 ENCOUNTER — Telehealth: Payer: Self-pay | Admitting: Gastroenterology

## 2023-09-18 NOTE — Telephone Encounter (Signed)
Patient called back to schedule her repeat colonoscopy.

## 2023-09-18 NOTE — Telephone Encounter (Signed)
Message left for patient to return my call.  

## 2023-09-19 ENCOUNTER — Other Ambulatory Visit: Payer: Self-pay | Admitting: *Deleted

## 2023-09-19 ENCOUNTER — Telehealth: Payer: Self-pay | Admitting: *Deleted

## 2023-09-19 DIAGNOSIS — Z860100 Personal history of colon polyps, unspecified: Secondary | ICD-10-CM

## 2023-09-19 MED ORDER — NA SULFATE-K SULFATE-MG SULF 17.5-3.13-1.6 GM/177ML PO SOLN: 1.0000 | Freq: Once | ORAL | 0 refills | Status: AC

## 2023-09-19 NOTE — Telephone Encounter (Signed)
Colonoscopy schedule with Dr Servando Snare on 10/09/2023 at [facility]

## 2023-09-19 NOTE — Telephone Encounter (Signed)
Gastroenterology Pre-Procedure Review  Request Date: 10/09/2023 Requesting Physician: Dr. Servando Snare  PATIENT REVIEW QUESTIONS: The patient responded to the following health history questions as indicated:    1. Are you having any GI issues? no 2. Do you have a personal history of Polyps? yes (10/04/2018) 3. Do you have a family history of Colon Cancer or Polyps? no 4. Diabetes Mellitus? no 5. Joint replacements in the past 12 months?no 6. Major health problems in the past 3 months?no 7. Any artificial heart valves, MVP, or defibrillator?no    MEDICATIONS & ALLERGIES:    Patient reports the following regarding taking any anticoagulation/antiplatelet therapy:   Plavix, Coumadin, Eliquis, Xarelto, Lovenox, Pradaxa, Brilinta, or Effient? no Aspirin? yes (81 mg)  Patient confirms/reports the following medications:  Current Outpatient Medications  Medication Sig Dispense Refill   Na Sulfate-K Sulfate-Mg Sulf 17.5-3.13-1.6 GM/177ML SOLN Take 1 kit by mouth once for 1 dose. 354 mL 0   albuterol (VENTOLIN HFA) 108 (90 Base) MCG/ACT inhaler INHALE 2 PUFFS INTO THE LUNGS EVERY 4 HOURS AS NEEDED FOR WHEEZE OR FOR SHORTNESS OF BREATH 6.7 each 5   aspirin EC 81 MG tablet Take 81 mg by mouth 2 (two) times daily.     atenolol (TENORMIN) 50 MG tablet TAKE 1 TABLET BY MOUTH EVERY DAY 90 tablet 1   Carboxymethylcellulose Sodium (EYE DROPS OP) Apply to eye daily.     Cholecalciferol (VITAMIN D) 2000 units tablet Take 2,000 Units by mouth daily.     COLLAGEN PO Take 1 Scoop by mouth 4 (four) times a week. Native Path Collagen     Cyanocobalamin (VITAMIN B-12) 5000 MCG SUBL Place 1 tablet under the tongue 3 (three) times a week.     Evolocumab (REPATHA SURECLICK) 140 MG/ML SOAJ Inject 140 mg into the skin every 14 (fourteen) days. 2 mL 6   fexofenadine (ALLEGRA) 180 MG tablet TAKE 1 TABLET BY MOUTH EVERY DAY 90 tablet 3   FLUoxetine (PROZAC) 20 MG tablet Take 1 tablet (20 mg total) by mouth daily. 90 tablet 3    fluticasone (FLONASE) 50 MCG/ACT nasal spray USE 2 SPRAYS IN EACH NOSTRIL ONCE DAILY 48 mL 3   fluticasone-salmeterol (WIXELA INHUB) 250-50 MCG/ACT AEPB Inhale 1 puff into the lungs in the morning and at bedtime. 180 each 1   GARLIC OIL PO Take 1 pg/oz/day by mouth once.     hydrochlorothiazide (HYDRODIURIL) 25 MG tablet TAKE 1 TABLET BY MOUTH EVERY DAY 90 tablet 3   L-Arginine 1000 MG TABS Take 1 tablet by mouth 1 day or 1 dose.     meclizine (ANTIVERT) 25 MG tablet TAKE 1 TABLET BY MOUTH 3 TIMES DAILY AS NEEDED FOR DIZZINESS. 60 tablet 5   Milk Thistle 250 MG CAPS Take 1 capsule by mouth once.     Misc Natural Products (DANDELION ROOT PO) Take 1 tablet by mouth once.     montelukast (SINGULAIR) 10 MG tablet TAKE 1 TABLET BY MOUTH EVERYDAY AT BEDTIME 90 tablet 1   Multiple Vitamins-Minerals (LIVER DETOX PO) Take by mouth daily.     Multiple Vitamins-Minerals (MULTIVITAMIN WOMENS 50+ ADV PO) Take by mouth.     nitroGLYCERIN (NITROSTAT) 0.4 MG SL tablet TAKE 1 TAB UNDER THE TONGUE EVERY 5 MIN AS NEEDED FOR CHEST PAIN. CALL 911. MAX 3 PER EPISODE 25 tablet 0   Omega-3 Krill Oil 1000 MG CAPS Take 2 capsules by mouth once.     OVER THE COUNTER MEDICATION Cream of tartar 3 times per  month     Pseudoephedrine-guaiFENesin (MUCINEX D PO) Take by mouth daily. Daytime/Night     PURE L-CITRULLINE PO Take 750 mg by mouth.     TURMERIC PO Take 1 tablet by mouth once.     No current facility-administered medications for this visit.    Patient confirms/reports the following allergies:  Allergies  Allergen Reactions   Penicillins Swelling and Rash        Statins Shortness Of Breath    And chest pain   Aspirin Other (See Comments)    Tongue swelling    No orders of the defined types were placed in this encounter.   AUTHORIZATION INFORMATION Primary Insurance: 1D#: Group #:  Secondary Insurance: 1D#: Group #:  SCHEDULE INFORMATION: Date: 10/09/2023 Time: Location: [facility]

## 2023-09-22 ENCOUNTER — Other Ambulatory Visit: Payer: Self-pay | Admitting: Family Medicine

## 2023-09-22 DIAGNOSIS — F439 Reaction to severe stress, unspecified: Secondary | ICD-10-CM

## 2023-09-22 DIAGNOSIS — I10 Essential (primary) hypertension: Secondary | ICD-10-CM

## 2023-09-22 DIAGNOSIS — F320 Major depressive disorder, single episode, mild: Secondary | ICD-10-CM

## 2023-09-22 NOTE — Telephone Encounter (Signed)
Pt was supposed to follow up With an in office visit 3-4 months after 05/2023 - pt needs an office visit.

## 2023-10-04 ENCOUNTER — Encounter: Payer: Self-pay | Admitting: Gastroenterology

## 2023-10-04 ENCOUNTER — Other Ambulatory Visit: Payer: Self-pay

## 2023-10-09 ENCOUNTER — Ambulatory Visit: Payer: Medicare Other | Admitting: Anesthesiology

## 2023-10-09 ENCOUNTER — Other Ambulatory Visit: Payer: Self-pay

## 2023-10-09 ENCOUNTER — Ambulatory Visit
Admission: RE | Admit: 2023-10-09 | Discharge: 2023-10-09 | Disposition: A | Discharge status: Home / Self Care | Payer: Medicare Other | Attending: Gastroenterology | Admitting: Gastroenterology

## 2023-10-09 ENCOUNTER — Encounter
Admission: RE | Disposition: A | Discharge status: Home / Self Care | Payer: Self-pay | Source: Home / Self Care | Attending: Gastroenterology

## 2023-10-09 ENCOUNTER — Encounter: Payer: Self-pay | Admitting: Gastroenterology

## 2023-10-09 DIAGNOSIS — Z1211 Encounter for screening for malignant neoplasm of colon: Secondary | ICD-10-CM | POA: Insufficient documentation

## 2023-10-09 DIAGNOSIS — J449 Chronic obstructive pulmonary disease, unspecified: Secondary | ICD-10-CM | POA: Insufficient documentation

## 2023-10-09 DIAGNOSIS — Z860100 Personal history of colon polyps, unspecified: Secondary | ICD-10-CM

## 2023-10-09 DIAGNOSIS — D125 Benign neoplasm of sigmoid colon: Secondary | ICD-10-CM

## 2023-10-09 DIAGNOSIS — N189 Chronic kidney disease, unspecified: Secondary | ICD-10-CM | POA: Insufficient documentation

## 2023-10-09 DIAGNOSIS — I252 Old myocardial infarction: Secondary | ICD-10-CM | POA: Insufficient documentation

## 2023-10-09 DIAGNOSIS — G4730 Sleep apnea, unspecified: Secondary | ICD-10-CM | POA: Diagnosis not present

## 2023-10-09 DIAGNOSIS — I129 Hypertensive chronic kidney disease with stage 1 through stage 4 chronic kidney disease, or unspecified chronic kidney disease: Secondary | ICD-10-CM | POA: Insufficient documentation

## 2023-10-09 DIAGNOSIS — K640 First degree hemorrhoids: Secondary | ICD-10-CM | POA: Insufficient documentation

## 2023-10-09 DIAGNOSIS — K635 Polyp of colon: Secondary | ICD-10-CM

## 2023-10-09 DIAGNOSIS — Z87891 Personal history of nicotine dependence: Secondary | ICD-10-CM | POA: Insufficient documentation

## 2023-10-09 DIAGNOSIS — I2510 Atherosclerotic heart disease of native coronary artery without angina pectoris: Secondary | ICD-10-CM | POA: Diagnosis not present

## 2023-10-09 HISTORY — DX: Unspecified osteoarthritis, unspecified site: M19.90

## 2023-10-09 HISTORY — PX: COLONOSCOPY WITH PROPOFOL: SHX5780

## 2023-10-09 HISTORY — DX: Spondylopathy, unspecified: M48.9

## 2023-10-09 HISTORY — PX: POLYPECTOMY: SHX5525

## 2023-10-09 HISTORY — DX: Other ill-defined heart diseases: I51.89

## 2023-10-09 HISTORY — DX: Atherosclerotic heart disease of native coronary artery without angina pectoris: I25.10

## 2023-10-09 HISTORY — DX: Nonrheumatic mitral (valve) insufficiency: I34.0

## 2023-10-09 HISTORY — DX: Other seasonal allergic rhinitis: J30.2

## 2023-10-09 HISTORY — DX: Cardiomegaly: I51.7

## 2023-10-09 HISTORY — DX: Chronic obstructive pulmonary disease, unspecified: J44.9

## 2023-10-09 HISTORY — DX: Unspecified asthma, uncomplicated: J45.909

## 2023-10-09 HISTORY — DX: Dizziness and giddiness: R42

## 2023-10-09 HISTORY — DX: Personal history of other diseases of the circulatory system: Z86.79

## 2023-10-09 SURGERY — COLONOSCOPY WITH PROPOFOL
Anesthesia: General | Site: Rectum

## 2023-10-09 MED ORDER — PROPOFOL 10 MG/ML IV BOLUS
INTRAVENOUS | Status: DC | PRN
  Administered 2023-10-09: 20 mg via INTRAVENOUS
  Administered 2023-10-09: 50 mg via INTRAVENOUS
  Administered 2023-10-09: 30 mg via INTRAVENOUS
  Administered 2023-10-09 (×2): 20 mg via INTRAVENOUS
  Administered 2023-10-09: 30 mg via INTRAVENOUS

## 2023-10-09 MED ORDER — PROPOFOL 10 MG/ML IV BOLUS
INTRAVENOUS | Status: AC
  Filled 2023-10-09: qty 40

## 2023-10-09 MED ORDER — LIDOCAINE HCL (CARDIAC) PF 100 MG/5ML IV SOSY
PREFILLED_SYRINGE | INTRAVENOUS | Status: DC | PRN
  Administered 2023-10-09: 50 mg via INTRAVENOUS

## 2023-10-09 MED ORDER — STERILE WATER FOR IRRIGATION IR SOLN
Status: DC | PRN
  Administered 2023-10-09: 50 mL

## 2023-10-09 MED ORDER — SODIUM CHLORIDE 0.9 % IV SOLN: INTRAVENOUS | Status: DC

## 2023-10-09 MED ORDER — LACTATED RINGERS IV SOLN: INTRAVENOUS | Status: DC | PRN

## 2023-10-09 SURGICAL SUPPLY — 6 items
GOWN CVR UNV OPN BCK APRN NK (MISCELLANEOUS) ×4 IMPLANT
KIT PRC NS LF DISP ENDO (KITS) ×2 IMPLANT
MANIFOLD NEPTUNE II (INSTRUMENTS) ×2 IMPLANT
SNARE COLD EXACTO (MISCELLANEOUS) IMPLANT
TRAP ETRAP POLY (MISCELLANEOUS) IMPLANT
WATER STERILE IRR 250ML POUR (IV SOLUTION) ×2 IMPLANT

## 2023-10-09 NOTE — Transfer of Care (Signed)
Immediate Anesthesia Transfer of Care Note  Patient: Tricia Vasquez  Procedure(s) Performed: COLONOSCOPY WITH PROPOFOL (Rectum) POLYPECTOMY (Rectum)  Patient Location: PACU  Anesthesia Type: General  Level of Consciousness: awake, alert  and patient cooperative  Airway and Oxygen Therapy: Patient Spontanous Breathing and Patient connected to supplemental oxygen  Post-op Assessment: Post-op Vital signs reviewed, Patient's Cardiovascular Status Stable, Respiratory Function Stable, Patent Airway and No signs of Nausea or vomiting  Post-op Vital Signs: Reviewed and stable  Complications: No notable events documented.

## 2023-10-09 NOTE — Anesthesia Preprocedure Evaluation (Addendum)
Anesthesia Evaluation  Patient identified by MRN, date of birth, ID band Patient awake    Reviewed: Allergy & Precautions, H&P , NPO status , Patient's Chart, lab work & pertinent test results  Airway Mallampati: I  TM Distance: <3 FB Neck ROM: Full    Dental no notable dental hx. (+) Caps   Pulmonary asthma , sleep apnea , COPD, former smoker   Pulmonary exam normal breath sounds clear to auscultation       Cardiovascular hypertension, + CAD and + Past MI  Normal cardiovascular exam Rhythm:Regular Rate:Normal  Echo 10-06-20  1. Left ventricular ejection fraction, by estimation, is 55 to 60%. The  left ventricle has normal function. Left ventricular endocardial border  not optimally defined to evaluate regional wall motion. Left ventricular  diastolic parameters are consistent  with Grade II diastolic dysfunction (pseudonormalization). Elevated left  atrial pressure.   2. Right ventricular systolic function is normal. The right ventricular  size is normal. There is mildly elevated pulmonary artery systolic  pressure.   3. Left atrial size was mild to moderately dilated.   4. The mitral valve is normal in structure. Mild mitral valve  regurgitation. No evidence of mitral stenosis.   5. The aortic valve is tricuspid. Aortic valve regurgitation is not  visualized. No aortic stenosis is present.   6. The inferior vena cava is normal in size with <50% respiratory  variability, suggesting right atrial pressure of 8 mmHg.      Neuro/Psych  Headaches PSYCHIATRIC DISORDERS Anxiety Depression     Neuromuscular disease CVA negative neurological ROS  negative psych ROS   GI/Hepatic negative GI ROS, Neg liver ROS,,,(+) Hepatitis -  Endo/Other  negative endocrine ROS    Renal/GU Renal diseasenegative Renal ROS  negative genitourinary   Musculoskeletal negative musculoskeletal ROS (+) Arthritis ,    Abdominal   Peds  (+)  Delivery details - Hematology negative hematology ROS (+)   Anesthesia Other Findings   Hypertension  Hyperlipidemia Depression  Meniere's disease of both ears Sleep apnea Heart attack (HCC) x 2 in 1980s and 1990s Stroke (HCC) 08-06-13  Hepatitis C Chronic kidney disease Osteoporosis Cerebrovascular accident (CVA) (HCC) Cervical spine disease Seasonal allergies  Asthma COPD (chronic obstructive pulmonary disease) (HCC)  Arthritis Vertigo  Enlarged heart Coronary artery disease  Mild mitral regurg  Grade II diastolic dysfunction Has sleep apnea, no CPAP    Reproductive/Obstetrics negative OB ROS                              Anesthesia Physical Anesthesia Plan  ASA: 3  Anesthesia Plan: General   Post-op Pain Management:    Induction: Intravenous  PONV Risk Score and Plan:   Airway Management Planned: Natural Airway and Nasal Cannula  Additional Equipment:   Intra-op Plan:   Post-operative Plan:   Informed Consent: I have reviewed the patients History and Physical, chart, labs and discussed the procedure including the risks, benefits and alternatives for the proposed anesthesia with the patient or authorized representative who has indicated his/her understanding and acceptance.     Dental Advisory Given  Plan Discussed with: Anesthesiologist, CRNA and Surgeon  Anesthesia Plan Comments: (Patient consented for risks of anesthesia including but not limited to:  - adverse reactions to medications - risk of airway placement if required - damage to eyes, teeth, lips or other oral mucosa - nerve damage due to positioning  - sore throat or hoarseness -  Damage to heart, brain, nerves, lungs, other parts of body or loss of life  Patient voiced understanding and assent.)         Anesthesia Quick Evaluation

## 2023-10-09 NOTE — H&P (Signed)
Tricia Minium, MD [facility] [address] [place], [place] [address] [phone] Fax : [phone]  Primary Care Physician:  Danelle Berry, PA-C Primary Gastroenterologist:  Dr. Servando Snare  Pre-Procedure History & Physical: HPI:  Tricia Vasquez is a 72 y.o. female is here for an colonoscopy.   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   Mild mitral regurgitation by prior echocardiogram    Osteoporosis    Seasonal allergies    Sleep apnea    No CPAP   Stroke [facility]) 2013   Vertigo     Past Surgical History:  Procedure Laterality Date   ABDOMINAL HYSTERECTOMY     COLON SURGERY     COLONOSCOPY WITH PROPOFOL N/A 10/04/2018   Procedure: COLONOSCOPY WITH PROPOFOL;  Surgeon: Tricia Minium, MD;  Location: [facility];  Service: Endoscopy;  Laterality: N/A;  sleep apnea   DILATION AND CURETTAGE OF UTERUS     OVARIAN CYST REMOVAL     POLYPECTOMY  10/04/2018   Procedure: POLYPECTOMY;  Surgeon: Tricia Minium, MD;  Location: [facility];  Service: Endoscopy;;   TONSILLECTOMY      Prior to Admission medications   Medication Sig Start Date End Date Taking? Authorizing Provider  albuterol (VENTOLIN HFA) 108 (90 Base) MCG/ACT inhaler INHALE 2 PUFFS INTO THE LUNGS EVERY 4 HOURS AS NEEDED FOR WHEEZE OR FOR SHORTNESS OF BREATH 10/11/22  Yes Danelle Berry, PA-C  aspirin EC 81 MG tablet Take 81 mg by mouth daily.   Yes [provider]  atenolol (TENORMIN) 50 MG  tablet TAKE 1 TABLET BY MOUTH EVERY DAY 01/09/23  Yes Danelle Berry, PA-C  Carboxymethylcellulose Sodium (EYE DROPS OP) Apply to eye daily.   Yes [provider]  Cholecalciferol (VITAMIN D) 2000 units tablet Take 2,000 Units by mouth daily.   Yes [provider]  COLLAGEN PO Take 1 Scoop by mouth 4 (four) times a week. Native Path Collagen   Yes [provider]  Cyanocobalamin (VITAMIN B-12) 5000 MCG SUBL Place 1 tablet under the tongue 3 (three) times a week.   Yes [provider]  fexofenadine (ALLEGRA) 180 MG tablet TAKE 1 TABLET BY MOUTH EVERY DAY 10/11/22  Yes Danelle Berry, PA-C  FLUoxetine (PROZAC) 20 MG tablet Take 1 tablet (20 mg total) by mouth daily. 01/10/23  Yes Danelle Berry, PA-C  fluticasone (FLONASE) 50 MCG/ACT nasal spray USE 2 SPRAYS IN EACH NOSTRIL ONCE DAILY 09/05/21  Yes Danelle Berry, PA-C  fluticasone-salmeterol (WIXELA INHUB) 250-50 MCG/ACT AEPB Inhale 1 puff into the lungs in the morning and at bedtime. 02/07/23  Yes Tapia, Sheliah Mends, PA-C  GARLIC OIL PO Take 1 pg/oz/day by mouth once.   Yes [provider]  hydrochlorothiazide (HYDRODIURIL) 25 MG tablet TAKE 1 TABLET BY MOUTH EVERY DAY 05/16/22  Yes Danelle Berry, PA-C  L-Arginine 1000 MG TABS  Take 1 tablet by mouth 1 day or 1 dose.   Yes [provider]  meclizine (ANTIVERT) 25 MG tablet TAKE 1 TABLET BY MOUTH 3 TIMES DAILY AS NEEDED FOR DIZZINESS. 01/10/23  Yes Danelle Berry, PA-C  Milk Thistle 250 MG CAPS Take 1 capsule by mouth once.   Yes [provider]  Misc Natural Products (DANDELION ROOT PO) Take 1 tablet by mouth once.   Yes [provider]  montelukast (SINGULAIR) 10 MG tablet TAKE 1 TABLET BY MOUTH EVERYDAY AT BEDTIME 01/10/23  Yes Tapia, Sheliah Mends, PA-C  Multiple Vitamins-Minerals (LIVER DETOX PO) Take by mouth daily.   Yes [provider]  Omega-3 Krill Oil 1000 MG CAPS Take 2 capsules by mouth once.   Yes [provider]  OVER THE COUNTER  MEDICATION Cream of tartar 3 times per month   Yes [provider]  OVER THE COUNTER MEDICATION Baking soda H2O   Yes [provider]  OVER THE COUNTER MEDICATION Clove garlic   Yes [provider]  OVER THE COUNTER MEDICATION Ginger root   Yes [provider]  PURE L-CITRULLINE PO Take 750 mg by mouth.   Yes [provider]  TURMERIC PO Take 1 tablet by mouth once.   Yes [provider]  Evolocumab (REPATHA SURECLICK) 140 MG/ML SOAJ Inject 140 mg into the skin every 14 (fourteen) days. Patient not taking: Reported on 10/04/2023 10/13/22   Charlsie Quest, NP  Multiple Vitamins-Minerals (MULTIVITAMIN WOMENS 50+ ADV PO) Take by mouth. Patient not taking: Reported on 10/04/2023    [provider]  nitroGLYCERIN (NITROSTAT) 0.4 MG SL tablet TAKE 1 TAB UNDER THE TONGUE EVERY 5 MIN AS NEEDED FOR CHEST PAIN. CALL 911. MAX 3 PER EPISODE 05/16/22   Danelle Berry, PA-C  Pseudoephedrine-guaiFENesin [facility] D PO) Take by mouth as needed. Daytime/Night Patient not taking: Reported on 10/09/2023    [provider]    Allergies as of 09/19/2023 - Review Complete 05/25/2023  Allergen Reaction Noted   Penicillins Swelling and Rash 05/12/2015   Statins Shortness Of Breath 05/12/2015   Aspirin Other (See Comments) 05/12/2015    Family History  Problem Relation Age of Onset   Hypertension Mother    Hypertension Father    Hypertension Sister    Hypertension Brother     Social History   Socioeconomic History   Marital status: Divorced    Spouse name: Not on file   Number of children: Not on file   Years of education: Not on file   Highest education level: Not on file  Occupational History   Not on file  Tobacco Use   Smoking status: Former    Current packs/day: 0.00    Types: Cigarettes    Quit date: 1970    Years since quitting: 54.9   Smokeless tobacco: Never  Vaping Use   Vaping status: Never Used  Substance and Sexual  Activity   Alcohol use: No    Alcohol/week: 0.0 standard drinks of alcohol    Comment: may have a drink 1x/mo   Drug use: No   Sexual activity: Not Currently  Other Topics Concern   Not on file  Social History Narrative   Pt lives alone    Social Determinants of Health   Financial Resource Strain: Low Risk  (04/20/2023)   Overall Financial Resource Strain (CARDIA)    Difficulty of Paying Living Expenses: Not hard at all  Food Insecurity: No Food Insecurity (04/20/2023)   Hunger Vital  Sign    Worried About Programme researcher, broadcasting/film/video in the Last Year: Never true    Ran Out of Food in the Last Year: Never true  Transportation Needs: No Transportation Needs (04/20/2023)   PRAPARE - Administrator, Civil Service (Medical): No    Lack of Transportation (Non-Medical): No  Physical Activity: Sufficiently Active (04/20/2023)   Exercise Vital Sign    Days of Exercise per Week: 7 days    Minutes of Exercise per Session: 30 min  Stress: No Stress Concern Present (04/20/2023)   [employer] of Occupational Health - Occupational Stress Questionnaire    Feeling of Stress : Not at all  Social Connections: Socially Isolated (04/20/2023)   Social Connection and Isolation Panel [NHANES]    Frequency of Communication with Friends and Family: More than three times a week    Frequency of Social Gatherings with Friends and Family: Once a week    Attends Religious Services: Never    Database administrator or Organizations: No    Attends Banker Meetings: Never    Marital Status: Divorced  Catering manager Violence: Not At Risk (04/20/2023)   Humiliation, Afraid, Rape, and Kick questionnaire    Fear of Current or Ex-Partner: No    Emotionally Abused: No    Physically Abused: No    Sexually Abused: No    Review of Systems: See HPI, otherwise negative ROS  Physical Exam: BP 125/64   Pulse (!) 57   Temp 98.2 F (36.8 C) (Temporal)   Resp 15   Ht 5' (1.524 m)   Wt 80.7 kg    SpO2 96%   BMI 34.76 kg/m  General:   Alert,  pleasant and cooperative in NAD Head:  Normocephalic and atraumatic. Neck:  Supple; no masses or thyromegaly. Lungs:  Clear throughout to auscultation.    Heart:  Regular rate and rhythm. Abdomen:  Soft, nontender and nondistended. Normal bowel sounds, without guarding, and without rebound.   Neurologic:  Alert and  oriented x4;  grossly normal neurologically.  Impression/Plan: Nicoleta Demmitt is here for an colonoscopy to be performed for a history of adenomatous polyps on 2019   Risks, benefits, limitations, and alternatives regarding  colonoscopy have been reviewed with the patient.  Questions have been answered.  All parties agreeable.   Tricia Minium, MD  10/09/2023, 10:55 AM

## 2023-10-09 NOTE — Anesthesia Postprocedure Evaluation (Signed)
Anesthesia Post Note  Patient: Tricia Vasquez  Procedure(s) Performed: COLONOSCOPY WITH PROPOFOL (Rectum) POLYPECTOMY (Rectum)  Patient location during evaluation: PACU Anesthesia Type: General Level of consciousness: awake and alert Pain management: pain level controlled Vital Signs Assessment: post-procedure vital signs reviewed and stable Respiratory status: spontaneous breathing, nonlabored ventilation, respiratory function stable and patient connected to nasal cannula oxygen Cardiovascular status: blood pressure returned to baseline and stable Postop Assessment: no apparent nausea or vomiting Anesthetic complications: no   No notable events documented.   Last Vitals:  Vitals:   10/09/23 1125 10/09/23 1129  BP:  129/73  Pulse: 63 (!) 58  Resp: 15 14  Temp:    SpO2: 96% 96%    Last Pain:  Vitals:   10/09/23 1129  TempSrc:   PainSc: 0-No pain                 Octavious Zidek C Render Marley

## 2023-10-09 NOTE — Op Note (Signed)
[facility]enterology Patient Name: Tricia Vasquez Procedure Date: 10/09/2023 10:53 AM MRN: [id number] Account #: [id number] Date of Birth: 07-Jun-1951 Admit Type: Outpatient Age: 72 Room: [facility] OR ROOM 01 Gender: Female Note Status: Finalized Instrument Name: [id number] Procedure:             Colonoscopy Indications:           High risk colon cancer surveillance: Personal history                         of colonic polyps Providers:             [name] MD, MD Referring MD:          Danelle Berry (Referring MD) Medicines:             Propofol per Anesthesia Complications:         No immediate complications. Procedure:             Pre-Anesthesia Assessment:                        - Prior to the procedure, a History and Physical was                         performed, and patient medications and allergies were                         reviewed. The patient's tolerance of previous                         anesthesia was also reviewed. The risks and benefits                         of the procedure and the sedation options and risks                         were discussed with the patient. All questions were                         answered, and informed consent was obtained. Prior                         Anticoagulants: The patient has taken no anticoagulant                         or antiplatelet agents. ASA Grade Assessment: II - A                         patient with mild systemic disease. After reviewing                         the risks and benefits, the patient was deemed in                         satisfactory condition to undergo the procedure.                        After obtaining informed consent, the colonoscope was  passed under direct vision. Throughout the procedure,                         the patient's blood pressure, pulse, and oxygen                         saturations were monitored continuously. The                          Colonoscope was introduced through the anus and                         advanced to the the cecum, identified by appendiceal                         orifice and ileocecal valve. The colonoscopy was                         performed without difficulty. The patient tolerated                         the procedure well. The quality of the bowel                         preparation was excellent. Findings:      The perianal and digital rectal examinations were normal.      A 5 mm polyp was found in the sigmoid colon. The polyp was pedunculated.       The polyp was removed with a cold snare. Resection and retrieval were       complete.      Non-bleeding internal hemorrhoids were found during retroflexion. The       hemorrhoids were Grade I (internal hemorrhoids that do not prolapse). Impression:            - One 5 mm polyp in the sigmoid colon, removed with a                         cold snare. Resected and retrieved.                        - Non-bleeding internal hemorrhoids. Recommendation:        - Discharge patient to home.                        - Resume previous diet.                        - Continue present medications.                        - Await pathology results.                        - Repeat colonoscopy in 5 years for surveillance. Procedure Code(s):     --- Professional ---                        [phone], Colonoscopy, flexible; with removal of  tumor(s), polyp(s), or other lesion(s) by snare                         technique Diagnosis Code(s):     --- Professional ---                        Z86.010, Personal history of colonic polyps                        D12.5, Benign neoplasm of sigmoid colon CPT copyright 2022 American Medical Association. All rights reserved. The codes documented in this report are preliminary and upon coder review may  be revised to meet current compliance requirements. [name] MD, MD 10/09/2023 11:18:07 AM This report has  been signed electronically. Number of Addenda: 0 Note Initiated On: 10/09/2023 10:53 AM Scope Withdrawal Time: 0 hours 6 minutes 47 seconds  Total Procedure Duration: 0 hours 11 minutes 26 seconds  Estimated Blood Loss:  Estimated blood loss: none.      [facility]

## 2023-10-10 ENCOUNTER — Encounter: Payer: Self-pay | Admitting: Gastroenterology

## 2023-10-11 LAB — SURGICAL PATHOLOGY

## 2023-10-12 ENCOUNTER — Encounter: Payer: Self-pay | Admitting: Gastroenterology

## 2023-11-23 ENCOUNTER — Other Ambulatory Visit: Payer: Self-pay | Admitting: Family Medicine

## 2023-11-23 DIAGNOSIS — F439 Reaction to severe stress, unspecified: Secondary | ICD-10-CM

## 2023-11-23 DIAGNOSIS — F320 Major depressive disorder, single episode, mild: Secondary | ICD-10-CM

## 2023-11-23 DIAGNOSIS — R42 Dizziness and giddiness: Secondary | ICD-10-CM

## 2023-11-23 DIAGNOSIS — J302 Other seasonal allergic rhinitis: Secondary | ICD-10-CM

## 2023-11-23 DIAGNOSIS — H8103 Meniere's disease, bilateral: Secondary | ICD-10-CM

## 2023-11-23 DIAGNOSIS — I10 Essential (primary) hypertension: Secondary | ICD-10-CM

## 2023-11-23 NOTE — Telephone Encounter (Signed)
Medication Refill - Most Recent Primary Care Visit:  Provider: Danelle Berry  Department: [facility]  Visit Type: MYCHART VIDEO VISIT  Date: 05/25/2023  Medication: meclizine (ANTIVERT) 25 MG tablet hydrochlorothiazide (HYDRODIURIL) 25 MG tablet [[id number]] fluticasone (FLONASE) 50 MCG/ACT nasal spray FLUoxetine (PROZAC) 20 MG tablet [[id number]]   Has the patient contacted their pharmacy? Yes  (Agent: If yes, when and what did the pharmacy advise?) Contact PCP   Is this the correct pharmacy for this prescription? Yes If no, delete pharmacy and type the correct one.  This is the patient's preferred pharmacy:  [id number] - [place], [place] - [address] AT [facility] [address] [place] [address] Phone: [phone] Fax: [phone]   Has the prescription been filled recently? No  Is the patient out of the medication? Yes  Has the patient been seen for an appointment in the last year OR does the patient have an upcoming appointment? Yes  Can we respond through MyChart? Yes  Agent: Please be advised that Rx refills may take up to 3 business days. We ask that you follow-up with your pharmacy.

## 2023-11-23 NOTE — Telephone Encounter (Signed)
Requested Prescriptions  Pending Prescriptions Disp Refills   meclizine (ANTIVERT) 25 MG tablet 60 tablet     Sig: TAKE 1 TABLET BY MOUTH 3 TIMES DAILY AS NEEDED FOR DIZZINESS.     Not Delegated - Gastroenterology: Antiemetics Failed - 11/23/2023  4:12 PM      Failed - This refill cannot be delegated      Failed - Valid encounter within last 6 months    Recent Outpatient Visits           6 months ago Osteoporosis, post-menopausal   [place] [facility] Danelle Berry, PA-C   10 months ago Primary hypertension   [place] [facility] Danelle Berry, PA-C   1 year ago Primary hypertension   [place] [facility] Danelle Berry, [place]   1 year ago Primary hypertension   [place] [facility] Danelle Berry, PA-C   1 year ago Primary hypertension   [place] [facility] Danelle Berry, PA-C               hydrochlorothiazide (HYDRODIURIL) 25 MG tablet 90 tablet     Sig: Take 1 tablet (25 mg total) by mouth daily.     Cardiovascular: Diuretics - Thiazide Failed - 11/23/2023  4:12 PM      Failed - Cr in normal range and within 180 days    Creat  Date Value Ref Range Status  09/04/2023 1.15 (H) 0.60 - 1.00 mg/dL Final         Failed - Valid encounter within last 6 months    Recent Outpatient Visits           6 months ago Osteoporosis, post-menopausal   [facility] Danelle Berry, PA-C   10 months ago Primary hypertension   [place] [facility] Danelle Berry, PA-C   1 year ago Primary hypertension   [place] [facility] Danelle Berry, PA-C   1 year ago Primary hypertension   [place] [facility] Danelle Berry, PA-C   1 year ago Primary hypertension   [place] [facility] Danelle Berry, PA-C              Passed - K in normal range and within 180 days    Potassium  Date Value Ref Range Status   09/04/2023 4.6 3.5 - 5.3 mmol/L Final         Passed - Na in normal range and within 180 days    Sodium  Date Value Ref Range Status  09/04/2023 139 135 - 146 mmol/L Final         Passed - Last BP in normal range    BP Readings from Last 1 Encounters:  10/09/23 129/73          fluticasone (FLONASE) 50 MCG/ACT nasal spray 48 mL     Sig: USE 2 SPRAYS IN EACH NOSTRIL ONCE DAILY     Ear, Nose, and Throat: Nasal Preparations - Corticosteroids Passed - 11/23/2023  4:12 PM      Passed - Valid encounter within last 12 months    Recent Outpatient Visits           6 months ago Osteoporosis, post-menopausal   [facility] Danelle Berry, PA-C   10 months ago Primary hypertension   [facility] Danelle Berry, PA-C   1 year ago Primary hypertension   [facility] Danelle Berry,  PA-C   1 year ago Primary hypertension   [place] [facility] [place], [place], [place]   1 year ago Primary hypertension   [place] [facility] [place], Sheliah Mends, [place]              Refused Prescriptions Disp Refills   FLUoxetine (PROZAC) 20 MG tablet 90 tablet     Sig: Take 1 tablet (20 mg total) by mouth daily.     Psychiatry:  Antidepressants - SSRI Failed - 11/23/2023  4:12 PM      Failed - Valid encounter within last 6 months    Recent Outpatient Visits           6 months ago Osteoporosis, post-menopausal   [facility] Danelle Berry, PA-C   10 months ago Primary hypertension   [place] [facility] Danelle Berry, PA-C   1 year ago Primary hypertension   [place] [facility] Danelle Berry, PA-C   1 year ago Primary hypertension   [place] [facility] Danelle Berry, PA-C   1 year ago Primary hypertension   [place] [facility] [place], [place], [place]              Passed - Completed PHQ-2 or PHQ-9 in the  last 360 days

## 2023-11-23 NOTE — Telephone Encounter (Signed)
Antivert 01/10/23 #60 5 RF  Hydrochlorothiazide: 05/16/22 #90 3 RF overdue lab work and appt Fluticasone 09/05/21 #48 ml 3 RF Fluoxetine requesting too soon reordered 01/10/23 #90 3 RF Called pt and and LM on VM to call back to make an appointment.  Requested Prescriptions  Pending Prescriptions Disp Refills   meclizine (ANTIVERT) 25 MG tablet 60 tablet 5    Sig: TAKE 1 TABLET BY MOUTH 3 TIMES DAILY AS NEEDED FOR DIZZINESS.     Not Delegated - Gastroenterology: Antiemetics Failed - 11/23/2023  3:50 PM      Failed - This refill cannot be delegated      Failed - Valid encounter within last 6 months    Recent Outpatient Visits           6 months ago Osteoporosis, post-menopausal   [place] [facility] Danelle Berry, PA-C   10 months ago Primary hypertension   [place] [facility] Danelle Berry, PA-C   1 year ago Primary hypertension   [place] [facility] Danelle Berry, PA-C   1 year ago Primary hypertension   [place] [facility], [facility]-C   1 year ago Primary hypertension   [place] [facility] Danelle Berry, PA-C               hydrochlorothiazide (HYDRODIURIL) 25 MG tablet 90 tablet 3    Sig: Take 1 tablet (25 mg total) by mouth daily.     Cardiovascular: Diuretics - Thiazide Failed - 11/23/2023  3:50 PM      Failed - Cr in normal range and within 180 days    Creat  Date Value Ref Range Status  09/04/2023 1.15 (H) 0.60 - 1.00 mg/dL Final         Failed - Valid encounter within last 6 months    Recent Outpatient Visits           6 months ago Osteoporosis, post-menopausal   [place] [facility] Danelle Berry, [facility]-C   10 months ago Primary hypertension   [place] [facility] Danelle Berry, PA-C   1 year ago Primary hypertension   [facility] Danelle Berry, PA-C   1 year ago Primary hypertension   [place]  [facility] Danelle Berry, PA-C   1 year ago Primary hypertension   [place] [facility] Danelle Berry, PA-C              Passed - K in normal range and within 180 days    Potassium  Date Value Ref Range Status  09/04/2023 4.6 3.5 - 5.3 mmol/L Final         Passed - Na in normal range and within 180 days    Sodium  Date Value Ref Range Status  09/04/2023 139 135 - 146 mmol/L Final         Passed - Last BP in normal range    BP Readings from Last 1 Encounters:  10/09/23 129/73          fluticasone (FLONASE) 50 MCG/ACT nasal spray 48 mL 3    Sig: USE 2 SPRAYS IN EACH NOSTRIL ONCE DAILY     Ear, Nose, and Throat: Nasal Preparations - Corticosteroids Passed - 11/23/2023  3:50 PM      Passed - Valid encounter within last 12 months    Recent Outpatient Visits           6 months  ago Osteoporosis, post-menopausal   [place] [facility] Danelle Berry, [place]   10 months ago Primary hypertension   [place] [facility] Danelle Berry, [place]   1 year ago Primary hypertension   [place] [facility] Danelle Berry, [place]   1 year ago Primary hypertension   [place] [facility] Danelle Berry, [place]   1 year ago Primary hypertension   [place] [facility] Danelle Berry, PA-C               FLUoxetine (PROZAC) 20 MG tablet 90 tablet 3    Sig: Take 1 tablet (20 mg total) by mouth daily.     Psychiatry:  Antidepressants - SSRI Failed - 11/23/2023  3:50 PM      Failed - Valid encounter within last 6 months    Recent Outpatient Visits           6 months ago Osteoporosis, post-menopausal   [facility] Danelle Berry, PA-C   10 months ago Primary hypertension   [place] [facility] Danelle Berry, PA-C   1 year ago Primary hypertension   [facility] Danelle Berry, PA-C   1 year ago Primary hypertension    [place] [facility] Danelle Berry, PA-C   1 year ago Primary hypertension    [facility] [place], [place], [place]              Passed - Completed PHQ-2 or PHQ-9 in the last 360 days

## 2023-11-23 NOTE — Telephone Encounter (Signed)
Requested medication (s) are due for refill today: yes   Requested medication (s) are on the active medication list: yes  Last refill:  Antivert 01/10/23 #60 5 RF Hydrochlorothiazide: 05/16/22 #90 3 RF Fluticasone: 09/05/21 48 ml 3 RF  Future visit scheduled: no  Notes to clinic:  called pt and LM on VM to call back to make appt.   Requested Prescriptions  Pending Prescriptions Disp Refills   meclizine (ANTIVERT) 25 MG tablet 60 tablet     Sig: TAKE 1 TABLET BY MOUTH 3 TIMES DAILY AS NEEDED FOR DIZZINESS.     Not Delegated - Gastroenterology: Antiemetics Failed - 11/23/2023  4:13 PM      Failed - This refill cannot be delegated      Failed - Valid encounter within last 6 months    Recent Outpatient Visits           6 months ago Osteoporosis, post-menopausal   [facility] Danelle Berry, PA-C   10 months ago Primary hypertension   [place] [facility] Danelle Berry, PA-C   1 year ago Primary hypertension   [place] [facility] Danelle Berry, PA-C   1 year ago Primary hypertension   [place] [facility] Danelle Berry, PA-C   1 year ago Primary hypertension   [facility] Danelle Berry, PA-C               hydrochlorothiazide (HYDRODIURIL) 25 MG tablet 90 tablet     Sig: Take 1 tablet (25 mg total) by mouth daily.     Cardiovascular: Diuretics - Thiazide Failed - 11/23/2023  4:13 PM      Failed - Cr in normal range and within 180 days    Creat  Date Value Ref Range Status  09/04/2023 1.15 (H) 0.60 - 1.00 mg/dL Final         Failed - Valid encounter within last 6 months    Recent Outpatient Visits           6 months ago Osteoporosis, post-menopausal   [place] [facility] Danelle Berry, PA-C   10 months ago Primary hypertension   [place] [facility] Danelle Berry, PA-C   1 year ago Primary hypertension   [place] Danelle Berry, PA-C   1 year ago Primary hypertension   [facility] Danelle Berry, PA-C   1 year ago Primary hypertension   [facility] [facility] Danelle Berry, PA-C              Passed - K in normal range and within 180 days    Potassium  Date Value Ref Range Status  09/04/2023 4.6 3.5 - 5.3 mmol/L Final         Passed - Na in normal range and within 180 days    Sodium  Date Value Ref Range Status  09/04/2023 139 135 - 146 mmol/L Final         Passed - Last BP in normal range    BP Readings from Last 1 Encounters:  10/09/23 129/73          fluticasone (FLONASE) 50 MCG/ACT nasal spray 48 mL     Sig: USE 2 SPRAYS IN EACH NOSTRIL ONCE DAILY     Ear, Nose, and Throat: Nasal Preparations - Corticosteroids Passed - 11/23/2023  4:13 PM      Passed - Valid encounter within last 12  months    Recent Outpatient Visits           6 months ago Osteoporosis, post-menopausal   [place] [facility] Danelle Berry, PA-C   10 months ago Primary hypertension   [place] [facility] Danelle Berry, PA-C   1 year ago Primary hypertension   [place] [facility] Danelle Berry, PA-C   1 year ago Primary hypertension   [place] [facility] Danelle Berry, PA-C   1 year ago Primary hypertension   [place] [name] Danelle Berry, [place]              Refused Prescriptions Disp Refills   FLUoxetine (PROZAC) 20 MG tablet 90 tablet     Sig: Take 1 tablet (20 mg total) by mouth daily.     Psychiatry:  Antidepressants - SSRI Failed - 11/23/2023  4:13 PM      Failed - Valid encounter within last 6 months    Recent Outpatient Visits           6 months ago Osteoporosis, post-menopausal   [facility] Danelle Berry, PA-C   10 months ago Primary hypertension   [place] Danelle Berry, PA-C   1 year  ago Primary hypertension   [facility] Danelle Berry, PA-C   1 year ago Primary hypertension   [place] [facility] Danelle Berry, PA-C   1 year ago Primary hypertension   [facility], [place], [place]              Passed - Completed PHQ-2 or PHQ-9 in the last 360 days

## 2023-11-23 NOTE — Telephone Encounter (Signed)
Pt needs appt for refills

## 2023-11-24 NOTE — Telephone Encounter (Signed)
Appt sch'd with Leisa for Monday 11/27/2023

## 2023-11-27 ENCOUNTER — Encounter: Payer: Self-pay | Admitting: Family Medicine

## 2023-11-27 ENCOUNTER — Ambulatory Visit: Payer: Medicare Other | Admitting: Family Medicine

## 2023-11-27 VITALS — BP 126/70 | HR 72 | Resp 16 | Ht 60.0 in | Wt 153.0 lb

## 2023-11-27 DIAGNOSIS — I252 Old myocardial infarction: Secondary | ICD-10-CM

## 2023-11-27 DIAGNOSIS — N1831 Chronic kidney disease, stage 3a: Secondary | ICD-10-CM

## 2023-11-27 DIAGNOSIS — I10 Essential (primary) hypertension: Secondary | ICD-10-CM | POA: Diagnosis not present

## 2023-11-27 DIAGNOSIS — M810 Age-related osteoporosis without current pathological fracture: Secondary | ICD-10-CM

## 2023-11-27 DIAGNOSIS — R42 Dizziness and giddiness: Secondary | ICD-10-CM

## 2023-11-27 DIAGNOSIS — J45901 Unspecified asthma with (acute) exacerbation: Secondary | ICD-10-CM | POA: Diagnosis not present

## 2023-11-27 DIAGNOSIS — H8103 Meniere's disease, bilateral: Secondary | ICD-10-CM

## 2023-11-27 DIAGNOSIS — F320 Major depressive disorder, single episode, mild: Secondary | ICD-10-CM

## 2023-11-27 DIAGNOSIS — J302 Other seasonal allergic rhinitis: Secondary | ICD-10-CM

## 2023-11-27 DIAGNOSIS — I2089 Other forms of angina pectoris: Secondary | ICD-10-CM

## 2023-11-27 MED ORDER — MECLIZINE HCL 25 MG PO TABS: 12.5000 mg | ORAL_TABLET | Freq: Three times a day (TID) | ORAL | 5 refills | Status: DC | PRN

## 2023-11-27 MED ORDER — ALENDRONATE SODIUM 70 MG PO TABS: 70.0000 mg | ORAL_TABLET | ORAL | 3 refills | Status: DC

## 2023-11-27 MED ORDER — NITROGLYCERIN 0.4 MG SL SUBL: 0.4000 mg | SUBLINGUAL_TABLET | SUBLINGUAL | 0 refills | Status: AC | PRN

## 2023-11-27 MED ORDER — FLUTICASONE PROPIONATE 50 MCG/ACT NA SUSP: NASAL | 3 refills | Status: DC

## 2023-11-27 MED ORDER — ATENOLOL 50 MG PO TABS: 50.0000 mg | ORAL_TABLET | Freq: Every day | ORAL | 1 refills | Status: DC

## 2023-11-27 MED ORDER — MONTELUKAST SODIUM 10 MG PO TABS: ORAL_TABLET | ORAL | 1 refills | Status: DC

## 2023-11-27 MED ORDER — HYDROCHLOROTHIAZIDE 25 MG PO TABS
25.0000 mg | ORAL_TABLET | Freq: Every day | ORAL | 1 refills | Status: DC
Start: 2023-11-27 — End: 2024-06-17

## 2023-11-27 MED ORDER — ALBUTEROL SULFATE HFA 108 (90 BASE) MCG/ACT IN AERS: 2.0000 | INHALATION_SPRAY | RESPIRATORY_TRACT | 5 refills | Status: DC | PRN

## 2023-11-27 MED ORDER — FEXOFENADINE HCL 180 MG PO TABS: 180.0000 mg | ORAL_TABLET | Freq: Every day | ORAL | 3 refills | Status: DC

## 2023-11-27 MED ORDER — MOMETASONE FURO-FORMOTEROL FUM 200-5 MCG/ACT IN AERO: 2.0000 | INHALATION_SPRAY | Freq: Two times a day (BID) | RESPIRATORY_TRACT | 5 refills | Status: DC

## 2023-11-27 MED ORDER — FLUOXETINE HCL 20 MG PO TABS: 20.0000 mg | ORAL_TABLET | Freq: Every day | ORAL | 1 refills | Status: DC

## 2023-11-27 NOTE — Progress Notes (Signed)
Name: Tricia Vasquez   MRN: [id number]    DOB: 1951-08-03   Date:11/27/2023       Progress Note  Chief Complaint  Patient presents with   Medication Refill     Subjective:   Tricia Vasquez is a 73 y.o. female, presents to clinic for routine f/up on chronic conditions  New osteoporosis - did virtual visit to review results in July, she completed labs in Nov Vit d a little low, she reports taking a 1000 or 2000 IU supplement once a week Previously discussed fosamax to strengthen bone density, hx of falls, high risk for fx Last vitamin D Lab Results  Component Value Date   VD25OH 29 (L) 09/04/2023   Hypertension:  Currently managed on atenolol and HCTZ Pt reports good med compliance and denies any SE.   Blood pressure today is well controlled. BP Readings from Last 3 Encounters:  11/27/23 126/70  10/09/23 129/73  05/25/23 115/61   Pt denies CP, SOB, exertional sx, LE edema, palpitation, Ha's, visual disturbances, lightheadedness, hypotension, syncope.   Hyperlipidemia:  statin intolerance with hx of past stroke and MI Currently treated with nothing - diet Discussed with cardiology in the past doing Repatha/Praluent medications but she read the side effects and was scared of doing them Last Lipids: Lab Results  Component Value Date   CHOL 250 (H) 01/10/2023   HDL 53 01/10/2023   LDLCALC 171 (H) 01/10/2023   TRIG 127 01/10/2023   CHOLHDL 4.7 01/10/2023   - Denies: Chest pain, shortness of breath, myalgias, claudication  One suspected gout episode  Uric acid was 6.9 -she is not had any further episodes no current symptoms today     Current Outpatient Medications:    aspirin EC 81 MG tablet, Take 81 mg by mouth daily., Disp: , Rfl:    Carboxymethylcellulose Sodium (EYE DROPS OP), Apply to eye daily., Disp: , Rfl:    Cholecalciferol (VITAMIN D) 2000 units tablet, Take 2,000 Units by mouth daily., Disp: , Rfl:    COLLAGEN PO, Take 1 Scoop by mouth 4 (four) times a  week. Native Path Collagen, Disp: , Rfl:    Cyanocobalamin (VITAMIN B-12) 5000 MCG SUBL, Place 1 tablet under the tongue 3 (three) times a week., Disp: , Rfl:    fluticasone-salmeterol (WIXELA INHUB) 250-50 MCG/ACT AEPB, Inhale 1 puff into the lungs in the morning and at bedtime., Disp: 180 each, Rfl: 1   GARLIC OIL PO, Take 1 pg/oz/day by mouth once., Disp: , Rfl:    L-Arginine 1000 MG TABS, Take 1 tablet by mouth 1 day or 1 dose., Disp: , Rfl:    meclizine (ANTIVERT) 25 MG tablet, TAKE 1 TABLET BY MOUTH 3 TIMES DAILY AS NEEDED FOR DIZZINESS., Disp: 60 tablet, Rfl: 5   Milk Thistle 250 MG CAPS, Take 1 capsule by mouth once., Disp: , Rfl:    Misc Natural Products (DANDELION ROOT PO), Take 1 tablet by mouth once., Disp: , Rfl:    Multiple Vitamins-Minerals (LIVER DETOX PO), Take by mouth daily., Disp: , Rfl:    Omega-3 Krill Oil 1000 MG CAPS, Take 2 capsules by mouth once., Disp: , Rfl:    OVER THE COUNTER MEDICATION, Cream of tartar 3 times per month, Disp: , Rfl:    OVER THE COUNTER MEDICATION, Baking soda H2O, Disp: , Rfl:    OVER THE COUNTER MEDICATION, Clove garlic, Disp: , Rfl:    OVER THE COUNTER MEDICATION, Ginger root, Disp: , Rfl:    PURE L-CITRULLINE  PO, Take 750 mg by mouth., Disp: , Rfl:    TURMERIC PO, Take 1 tablet by mouth once., Disp: , Rfl:    albuterol (VENTOLIN HFA) 108 (90 Base) MCG/ACT inhaler, Inhale 2 puffs into the lungs every 4 (four) hours as needed for wheezing or shortness of breath., Disp: 6.7 each, Rfl: 5   alendronate (FOSAMAX) 70 MG tablet, Take 1 tablet (70 mg total) by mouth every 7 (seven) days. Take with a full glass of water on an empty stomach., Disp: 12 tablet, Rfl: 3   atenolol (TENORMIN) 50 MG tablet, Take 1 tablet (50 mg total) by mouth daily., Disp: 90 tablet, Rfl: 1   Evolocumab (REPATHA SURECLICK) 140 MG/ML SOAJ, Inject 140 mg into the skin every 14 (fourteen) days. (Patient not taking: Reported on 10/04/2023), Disp: 2 mL, Rfl: 6   fexofenadine  (ALLEGRA) 180 MG tablet, Take 1 tablet (180 mg total) by mouth daily., Disp: 90 tablet, Rfl: 3   FLUoxetine (PROZAC) 20 MG tablet, Take 1 tablet (20 mg total) by mouth daily., Disp: 90 tablet, Rfl: 1   fluticasone (FLONASE) 50 MCG/ACT nasal spray, USE 2 SPRAYS IN EACH NOSTRIL ONCE DAILY, Disp: 48 mL, Rfl: 3   hydrochlorothiazide (HYDRODIURIL) 25 MG tablet, Take 1 tablet (25 mg total) by mouth daily., Disp: 90 tablet, Rfl: 1   montelukast (SINGULAIR) 10 MG tablet, TAKE 1 TABLET BY MOUTH EVERYDAY AT BEDTIME, Disp: 90 tablet, Rfl: 1   Multiple Vitamins-Minerals (MULTIVITAMIN WOMENS 50+ ADV PO), Take by mouth. (Patient not taking: Reported on 10/04/2023), Disp: , Rfl:    nitroGLYCERIN (NITROSTAT) 0.4 MG SL tablet, Place 1 tablet (0.4 mg total) under the tongue every 5 (five) minutes as needed for chest pain., Disp: 25 tablet, Rfl: 0   Pseudoephedrine-guaiFENesin (MUCINEX D PO), Take by mouth as needed. Daytime/Night (Patient not taking: Reported on 10/09/2023), Disp: , Rfl:   Patient Active Problem List   Diagnosis Date Noted   History of colon polyps 10/09/2023   Polyp of colon 10/09/2023   Statin intolerance 01/10/2023   Stage 3a chronic kidney disease (HCC) 01/10/2023   Environmental and seasonal allergies 01/10/2023   S/P abdominal hysterectomy 06/14/2021   Coronary artery disease involving native heart 05/31/2021   Current mild episode of major depressive disorder, unspecified whether recurrent (HCC) 05/31/2021   Benign neoplasm of transverse colon    Benign neoplasm of ascending colon    Cervical radiculopathy 12/22/2017   GAD (generalized anxiety disorder) 09/30/2016   Moderate persistent asthma without complication 09/30/2016   Apnea, sleep 04/11/2016   Osteoporosis, 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   Primary hypertension 04/11/2016   Hyperlipidemia 04/11/2016   Depression 04/11/2016   Hx of myocardial infarction 03/24/2016   Hx of hepatitis C 03/24/2016   Hx of completed stroke 03/24/2016    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   Headache disorder 08/06/2014    Past Surgical History:  Procedure Laterality Date   ABDOMINAL HYSTERECTOMY     COLON SURGERY     COLONOSCOPY WITH PROPOFOL N/A 10/04/2018   Procedure: COLONOSCOPY WITH PROPOFOL;  Surgeon: [name], MD;  [facility];  Service: Endoscopy;  Laterality: N/A;  sleep apnea   COLONOSCOPY WITH PROPOFOL N/A 10/09/2023   Procedure: COLONOSCOPY WITH PROPOFOL;  Surgeon: [name], MD;  Location[facility];  Service: Endoscopy;  Laterality: N/A;   DILATION AND CURETTAGE OF UTERUS     OVARIAN CYST REMOVAL     POLYPECTOMY  10/04/2018   Procedure: POLYPECTOMY;  Surgeon: Servando Snare,  Darren, MD;  Location: [facility];  Service: Endoscopy;;   POLYPECTOMY  10/09/2023   Procedure: POLYPECTOMY;  Surgeon: [name], MD;  Location: [facility];  Service: Endoscopy;;   TONSILLECTOMY      Family History  Problem Relation Age of Onset   Hypertension Mother    Hypertension Father    Hypertension Sister    Hypertension Brother     Social History   Tobacco Use   Smoking status: Former    Current packs/day: 0.00    Types: Cigarettes    Quit date: 1970    Years since quitting: 55.1   Smokeless tobacco: Never  Vaping Use   Vaping status: Never Used  Substance Use Topics   Alcohol use: No    Alcohol/week: 0.0 standard drinks of alcohol    Comment: may have a drink 1x/mo   Drug use: No     Allergies  Allergen Reactions   Penicillins Swelling and Rash        Statins Shortness Of Breath    And chest pain   Aspirin Other (See Comments)    Tongue swelling    Health Maintenance  Topic Date Due   INFLUENZA VACCINE  01/29/2024 (Originally 06/01/2023)   Medicare Annual Wellness (AWV)  04/19/2024   MAMMOGRAM  04/24/2024   DTaP/Tdap/Td (2 - Td or Tdap) 09/25/2028   Colonoscopy  10/08/2028   DEXA SCAN  Completed   Hepatitis C Screening  Completed   HPV VACCINES  Aged Out    Pneumonia Vaccine 71+ Years old  Discontinued   COVID-19 Vaccine  Discontinued   Zoster Vaccines- Shingrix  Discontinued    Chart Review Today: I personally reviewed active problem list, medication list, allergies, family history, social history, health maintenance, notes from last encounter, lab results, imaging with the patient/caregiver today.   Review of Systems  Constitutional: Negative.   HENT: Negative.    Eyes: Negative.   Respiratory: Negative.    Cardiovascular: Negative.   Gastrointestinal: Negative.   Endocrine: Negative.   Genitourinary: Negative.   Musculoskeletal: Negative.   Skin: Negative.   Allergic/Immunologic: Negative.   Neurological: Negative.   Hematological: Negative.   Psychiatric/Behavioral: Negative.    All other systems reviewed and are negative.    Objective:   Vitals:   11/27/23 1328  BP: 126/70  Pulse: 72  Resp: 16  SpO2: 96%  Weight: 153 lb (69.4 kg)  Height: 5' (1.524 m)    Body mass index is 29.88 kg/m.  Physical Exam Vitals and nursing note reviewed.  Constitutional:      General: She is not in acute distress.    Appearance: Normal appearance. She is well-developed. She is not ill-appearing, toxic-appearing or diaphoretic.  HENT:     Head: Normocephalic and atraumatic.     Nose: Nose normal.  Eyes:     General:        Right eye: No discharge.        Left eye: No discharge.     Conjunctiva/sclera: Conjunctivae normal.  Neck:     Trachea: No tracheal deviation.  Cardiovascular:     Rate and Rhythm: Normal rate and regular rhythm.     Pulses: Normal pulses.     Heart sounds: Normal heart sounds.  Pulmonary:     Effort: Pulmonary effort is normal. No respiratory distress.     Breath sounds: Normal breath sounds. No stridor.  Musculoskeletal:        General: Normal range of motion.  Skin:  General: Skin is warm and dry.     Findings: No rash.  Neurological:     Mental Status: She is alert.     Motor: No abnormal  muscle tone.     Coordination: Coordination normal.  Psychiatric:        Behavior: Behavior normal.         Assessment & Plan:     ICD-10-CM   1. Osteoporosis, post-menopausal  M81.0 alendronate (FOSAMAX) 70 MG tablet   discussed results and labs, she is willing to start fosamax, reviewed how to take meds, increase vit D supplement    2. Stage 3a chronic kidney disease (HCC)  N18.31    GFR stable ~50, discussed ways to preserve renal function, BP well controlled and at goal today    3. Primary hypertension  I10 hydrochlorothiazide (HYDRODIURIL) 25 MG tablet   BP at goal today on atenolol and HCTZ, refill on meds, recent labs reviewed, continue same meds/doses and diet/lifestyle efforts    4. Exacerbation of persistent asthma, unspecified asthma severity  J45.901 albuterol (VENTOLIN HFA) 108 (90 Base) MCG/ACT inhaler    montelukast (SINGULAIR) 10 MG tablet    mometasone-formoterol (DULERA) 200-5 MCG/ACT AERO   not well controlled with weather changes, using albuterol 3x a day, add maintenence inhaler    5. Hx of myocardial infarction  I25.2 atenolol (TENORMIN) 50 MG tablet    nitroGLYCERIN (NITROSTAT) 0.4 MG SL tablet   per cardiology     6. Seasonal allergies  J30.2 fexofenadine (ALLEGRA) 180 MG tablet    fluticasone (FLONASE) 50 MCG/ACT nasal spray   refill on montelukast, allegra    7. Current mild episode of major depressive disorder, unspecified whether recurrent (HCC)  F32.0 FLUoxetine (PROZAC) 20 MG tablet   pt would like to continue current dose -situational stress much improved, PHQ 9 reviewed, refill on prozac 20 mg daily    8. Meniere's disease of both ears  H81.03 meclizine (ANTIVERT) 25 MG tablet   more symptomatic with weather changes, discussed using lowest dose and long term side effects of meclizine    9. Chronic vertigo  R42 meclizine (ANTIVERT) 25 MG tablet   refill on meds, difficult assessment with worsening recent vertigo and history of Mnire's     10. Stable angina (HCC)  I20.89 nitroGLYCERIN (NITROSTAT) 0.4 MG SL tablet   no recent exertional sx, she did est with cardiology         Return in about 6 months (around 05/26/2024).   Danelle Berry, PA-C 11/27/23 1:48 PM

## 2023-12-20 ENCOUNTER — Telehealth: Payer: Self-pay

## 2023-12-20 NOTE — Telephone Encounter (Signed)
Fluoxetine   Information regarding your request [facility] was not able to process the request because the previous Prior Authorization Request was Denied, please submit an electronic Appeal Request. You can also contact the plan at [phone] or fax in request to [phone].

## 2023-12-21 NOTE — Telephone Encounter (Signed)
 Left detailed vm

## 2024-04-02 ENCOUNTER — Other Ambulatory Visit: Payer: Self-pay | Admitting: Family Medicine

## 2024-04-02 DIAGNOSIS — H8103 Meniere's disease, bilateral: Secondary | ICD-10-CM

## 2024-04-02 DIAGNOSIS — R42 Dizziness and giddiness: Secondary | ICD-10-CM

## 2024-04-03 NOTE — Telephone Encounter (Signed)
 Requested medication (s) are due for refill today: na   Requested medication (s) are on the active medication list: yes   Last refill:  11/27/23 #60 5 refills   Future visit scheduled: yes in 1 month   Notes to clinic:  not delegated per protocol .  Pharmacy comment: Script Clarification:PLEASE CORRECT SIG AND RESEND.      Requested Prescriptions  Pending Prescriptions Disp Refills   meclizine  (ANTIVERT ) 25 MG tablet [Pharmacy Med Name: MECLIZINE  25 MG TABLET] 60 tablet 5    Sig: TAKE 0.5-1 TABLETS (12.5-25 MG TOTAL) BY MOUTH 3 TIMES DAILY AS NEEDED FOR DIZZINESS (VERTIGO)     Not Delegated - Gastroenterology: Antiemetics Failed - 04/03/2024  3:37 PM      Failed - This refill cannot be delegated      Failed - Valid encounter within last 6 months    Recent Outpatient Visits   None     Future Appointments             In 1 month Adeline Hone, PA-C [facility], [facility]

## 2024-04-25 ENCOUNTER — Ambulatory Visit: Admitting: Cardiology

## 2024-04-25 ENCOUNTER — Ambulatory Visit: Payer: Medicare Other

## 2024-04-25 DIAGNOSIS — Z0000 Encounter for general adult medical examination without abnormal findings: Secondary | ICD-10-CM

## 2024-04-25 DIAGNOSIS — Z1231 Encounter for screening mammogram for malignant neoplasm of breast: Secondary | ICD-10-CM

## 2024-04-25 NOTE — Progress Notes (Signed)
 Subjective:   Tricia  Vasquez is a 73 y.o. who presents for a Medicare Wellness preventive visit.  As a reminder, Annual Wellness Visits don't include a physical exam, and some assessments may be limited, especially if this visit is performed virtually. We may recommend an in-person follow-up visit with your provider if needed.  Visit Complete: Virtual I connected with  Tricia  Vasquez on 04/25/24 by a audio enabled telemedicine application and verified that I am speaking with the correct person using two identifiers.  Patient Location: Home  Provider Location: Home Office  I discussed the limitations of evaluation and management by telemedicine. The patient expressed understanding and agreed to proceed.  Vital Signs: Because this visit was a virtual/telehealth visit, some criteria may be missing or patient reported. Any vitals not documented were not able to be obtained and vitals that have been documented are patient reported.  VideoDeclined- This patient declined Librarian, academic. Therefore the visit was completed with audio only.  Persons Participating in Visit: Patient.  AWV Questionnaire: No: Patient Medicare AWV questionnaire was not completed prior to this visit.  Cardiac Risk Factors include: advanced age (>17men, >6 women);hypertension;dyslipidemia     Objective:    There were no vitals filed for this visit. There is no height or weight on file to calculate BMI.     04/25/2024    1:30 PM 10/09/2023   10:08 AM 04/20/2023    2:32 PM 04/05/2022    2:21 PM 04/01/2021    2:36 PM 10/04/2018   10:25 AM 03/24/2016    2:43 PM  Advanced Directives  Does Patient Have a Medical Advance Directive? No No No No No No  No   Does patient want to make changes to medical advance directive?    Yes (MAU/Ambulatory/Procedural Areas - Information given)     Would patient like information on creating a medical advance directive? No - Patient declined No -  Patient declined   Yes (MAU/Ambulatory/Procedural Areas - Information given) Yes (MAU/Ambulatory/Procedural Areas - Information given)  No - patient declined information      Data saved with a previous flowsheet row definition    Current Medications (verified) Outpatient Encounter Medications as of 04/25/2024  Medication Sig   albuterol  (VENTOLIN  HFA) 108 (90 Base) MCG/ACT inhaler Inhale 2 puffs into the lungs every 4 (four) hours as needed for wheezing or shortness of breath.   aspirin EC 81 MG tablet Take 81 mg by mouth daily.   atenolol  (TENORMIN ) 50 MG tablet Take 1 tablet (50 mg total) by mouth daily.   Carboxymethylcellulose Sodium (EYE DROPS OP) Apply to eye daily.   Cholecalciferol (VITAMIN D ) 2000 units tablet Take 2,000 Units by mouth daily.   COLLAGEN PO Take 1 Scoop by mouth 4 (four) times a week. Native Path Collagen   Cyanocobalamin (VITAMIN B-12) 5000 MCG SUBL Place 1 tablet under the tongue 3 (three) times a week.   fexofenadine  (ALLEGRA ) 180 MG tablet Take 1 tablet (180 mg total) by mouth daily.   FLUoxetine  (PROZAC ) 20 MG tablet Take 1 tablet (20 mg total) by mouth daily.   fluticasone  (FLONASE ) 50 MCG/ACT nasal spray USE 2 SPRAYS IN EACH NOSTRIL ONCE DAILY   GARLIC OIL PO Take 1 pg/oz/day by mouth once.   Ginger, Zingiber officinalis, (GINGER PO) Take by mouth.   hydrochlorothiazide  (HYDRODIURIL ) 25 MG tablet Take 1 tablet (25 mg total) by mouth daily.   L-Arginine 1000 MG TABS Take 1 tablet by mouth 1 day or 1  dose.   meclizine  (ANTIVERT ) 25 MG tablet Take 0.5-1 tablets (12.5-25 mg total) by mouth 3 (three) times daily as needed for dizziness (vertigo). TAKE 1 TABLET BY MOUTH 3 TIMES DAILY AS NEEDED FOR DIZZINESS.   Milk Thistle 250 MG CAPS Take 1 capsule by mouth once.   Misc Natural Products (DANDELION ROOT PO) Take 1 tablet by mouth once.   mometasone -formoterol  (DULERA) 200-5 MCG/ACT AERO Inhale 2 puffs into the lungs 2 (two) times daily. Rinse out mouth after each use    montelukast  (SINGULAIR ) 10 MG tablet TAKE 1 TABLET BY MOUTH EVERYDAY AT BEDTIME   Multiple Vitamins-Minerals (LIVER DETOX PO) Take by mouth daily.   Multiple Vitamins-Minerals (MULTIVITAMIN WOMENS 50+ ADV PO) Take by mouth.   nitroGLYCERIN  (NITROSTAT ) 0.4 MG SL tablet Place 1 tablet (0.4 mg total) under the tongue every 5 (five) minutes as needed for chest pain.   Omega-3 Krill Oil 1000 MG CAPS Take 2 capsules by mouth once.   Pseudoephedrine-guaiFENesin (MUCINEX D PO) Take by mouth as needed. Daytime/Night   PURE L-CITRULLINE PO Take 750 mg by mouth.   TURMERIC PO Take 1 tablet by mouth once.   alendronate  (FOSAMAX ) 70 MG tablet Take 1 tablet (70 mg total) by mouth every 7 (seven) days. Take with a full glass of water  on an empty stomach. (Patient not taking: Reported on 04/25/2024)   Evolocumab  (REPATHA  SURECLICK) 140 MG/ML SOAJ Inject 140 mg into the skin every 14 (fourteen) days. (Patient not taking: Reported on 04/25/2024)   OVER THE COUNTER MEDICATION Cream of tartar 3 times per month   OVER THE COUNTER MEDICATION Baking soda H2O   OVER THE COUNTER MEDICATION Clove garlic   OVER THE COUNTER MEDICATION Ginger root   No facility-administered encounter medications on file as of 04/25/2024.    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   Mild mitral regurgitation by prior echocardiogram    Osteoporosis    Seasonal  allergies    Sleep apnea    No CPAP   Stroke [facility]) 2013   Vertigo    Past Surgical History:  Procedure Laterality Date   ABDOMINAL HYSTERECTOMY     COLON SURGERY     COLONOSCOPY WITH PROPOFOL  N/A 10/04/2018   Procedure: COLONOSCOPY WITH PROPOFOL ;  Surgeon: Jinny Carmine, MD;  Location: [facility];  Service: Endoscopy;  Laterality: N/A;  sleep apnea   COLONOSCOPY WITH PROPOFOL  N/A 10/09/2023   Procedure: COLONOSCOPY WITH PROPOFOL ;  Surgeon: Jinny Carmine, MD;  Location: [facility];  Service: Endoscopy;  Laterality: N/A;   DILATION AND CURETTAGE OF UTERUS     OVARIAN CYST REMOVAL     POLYPECTOMY  10/04/2018   Procedure: POLYPECTOMY;  Surgeon: Jinny Carmine, MD;  Location: [facility];  Service: Endoscopy;;   POLYPECTOMY  10/09/2023   Procedure: POLYPECTOMY;  Surgeon: Jinny Carmine, MD;  Location: [facility];  Service: Endoscopy;;   TONSILLECTOMY     Family History  Problem Relation Age of Onset   Hypertension Mother    Hypertension Father    Hypertension Sister    Hypertension Brother    Social History   Socioeconomic History   Marital status: Divorced    Spouse name: Not on file   Number of children: Not on file   Years of education: Not on file   Highest education level: Not on file  Occupational History   Not on file  Tobacco Use   Smoking status: Former    Current packs/day: 0.00    Types: Cigarettes    Quit date: 1970    Years since quitting: 55.5   Smokeless tobacco: Never  Vaping Use   Vaping status: Never Used  Substance and Sexual Activity   Alcohol use: No    Alcohol/week: 0.0 standard drinks of alcohol    Comment: may have a drink 1x/mo   Drug use: No   Sexual activity: Not Currently  Other Topics Concern   Not on file  Social History Narrative   Pt lives alone    Social Drivers of Health   Financial Resource Strain: Low Risk  (04/25/2024)   Overall Financial Resource Strain (CARDIA)    Difficulty of Paying Living  Expenses: Not hard at all  Food Insecurity: No Food Insecurity (04/25/2024)   Hunger Vital Sign    Worried About Running Out of Food in the Last Year: Never true    Ran Out of Food in the Last Year: Never true  Transportation Needs: No Transportation Needs (04/25/2024)   PRAPARE - Administrator, Civil Service (Medical): No    Lack of Transportation (Non-Medical): No  Physical Activity: Sufficiently Active (04/25/2024)   Exercise Vital Sign    Days of Exercise per Week: 7 days    Minutes of Exercise per Session: 30 min  Stress: No Stress Concern Present (04/25/2024)   [employer] of Occupational Health - Occupational Stress Questionnaire    Feeling of Stress: Only a little  Social Connections: Socially Isolated (04/25/2024)   Social Connection and Isolation Panel    Frequency of Communication with Friends and Family: More than three times a week    Frequency of Social Gatherings with Friends and Family: Once a week    Attends Religious Services: Never    Database administrator or Organizations: No    Attends Engineer, structural: Never    Marital Status: Divorced    Tobacco Counseling Counseling given: Not Answered    Clinical Intake:  Pre-visit preparation completed: Yes  Pain : No/denies pain     BMI - recorded: 29.9 Nutritional Status: BMI 25 -29 Overweight Nutritional Risks: None Diabetes: No  No results found for: HGBA1C   How often do you need to have someone help you when you read instructions, pamphlets, or other written materials from your doctor or pharmacy?: 1 - Never  Interpreter Needed?: No  Information entered by :: JHONNIE DAS, LPN   Activities of Daily Living    04/25/2024    1:32 PM 10/09/2023   10:10 AM  In your present state of health, do you have any difficulty performing the following activities:  Hearing? 0 0  Vision? 0 0  Difficulty concentrating or making decisions? 0 0  Walking or climbing stairs? 0    Dressing or bathing?  0   Doing errands, shopping? 0   Preparing Food and eating ? N   Using the Toilet? N   In the past six months, have you accidently leaked urine? N   Do you have problems with loss of bowel control? N   Managing your Medications? N   Managing your Finances? N   Housekeeping or managing your Housekeeping? N     Patient Care Team: Tapia, Leisa, PA-C as PCP - General (Family Medicine) Darliss Rogue, MD as PCP - Cardiology (Cardiology) [place], [place], LCSW as Social Worker Pa, [facility] (Optometry)  I have updated your Care Teams any recent Medical Services you may have received from other providers in the past year.     Assessment:   This is a routine wellness examination for Tricia Vasquez .  Hearing/Vision screen Hearing Screening - Comments:: NO AIDS Vision Screening - Comments:: WEARS GLASSES ALL DAY- [facility]   Goals Addressed             This Visit's Progress    DIET - EAT MORE FRUITS AND VEGETABLES         Depression Screen     04/25/2024    1:28 PM 11/27/2023    1:28 PM 04/20/2023    2:30 PM 01/10/2023    1:48 PM 10/11/2022    2:59 PM 08/25/2022    2:30 PM 08/03/2022    1:31 PM  PHQ 2/9 Scores  PHQ - 2 Score 0 0 0 0 2 1 1   PHQ- 9 Score 0 0  0 5      Fall Risk     04/25/2024    1:31 PM 04/20/2023    2:23 PM 01/10/2023    1:48 PM 10/11/2022    2:59 PM 07/12/2022    2:36 PM  Fall Risk   Falls in the past year? 1 0 0 0 0  Number falls in past yr: 0 0 0 0 0  Injury with Fall? 0 0 0 0 0  Risk for fall due to :  No Fall Risks No Fall Risks No Fall Risks No Fall Risks  Follow up Falls evaluation completed;Falls prevention discussed Education provided;Falls prevention discussed Falls prevention discussed;Education provided;Falls evaluation completed Falls prevention discussed;Education provided;Falls evaluation completed  Falls prevention discussed;Education provided      Data saved with a previous flowsheet row definition     MEDICARE RISK AT HOME:  Medicare Risk at Home Any stairs in or around the home?: Yes If so, are there any without handrails?: No Home free of loose throw rugs in walkways, pet beds, electrical cords, etc?: Yes Adequate lighting in your home to reduce risk of falls?: Yes Life alert?: No Use of a cane, walker or w/c?: No Grab bars in the bathroom?: No Shower chair or bench in shower?: No Elevated toilet seat or a handicapped toilet?: No  TIMED UP AND GO:  Was the test performed?  No  Cognitive Function: 6CIT completed        04/25/2024    1:35 PM 04/20/2023    2:33 PM  6CIT Screen  What Year? 0 points 0 points  What month? 0 points 0 points  What time? 0 points 0 points  Count back from 20 0 points 0 points  Months in reverse 0 points 0 points  Repeat phrase 0 points 0 points  Total Score 0 points 0 points    Immunizations Immunization History  Administered Date(s) Administered   Tdap 09/25/2018  Screening Tests Health Maintenance  Topic Date Due   Hepatitis B Vaccines (1 of 3 - Risk 3-dose series) Never done   MAMMOGRAM  04/24/2024   INFLUENZA VACCINE  05/31/2024   DEXA SCAN  04/24/2025   Medicare Annual Wellness (AWV)  04/25/2025   DTaP/Tdap/Td (2 - Td or Tdap) 09/25/2028   Colonoscopy  10/08/2028   Hepatitis C Screening  Completed   HPV VACCINES  Aged Out   Meningococcal B Vaccine  Aged Out   Pneumococcal Vaccine: 50+ Years  Discontinued   COVID-19 Vaccine  Discontinued   Zoster Vaccines- Shingrix   Discontinued    Health Maintenance  Health Maintenance Due  Topic Date Due   Hepatitis B Vaccines (1 of 3 - Risk 3-dose series) Never done   MAMMOGRAM  04/24/2024   Health Maintenance Items Addressed: Mammogram ordered; BDS UP TO DATE, COLONOSCOPY UP TO DATE; UP TO DATE ON TDAP; DECLINES ALL OTHER VACCINES  Additional Screening:  Vision Screening: Recommended annual ophthalmology exams for early detection of glaucoma and other disorders of the  eye. Would you like a referral to an eye doctor? No    Dental Screening: Recommended annual dental exams for proper oral hygiene  Community Resource Referral / Chronic Care Management: CRR required this visit?  No   CCM required this visit?  No   Plan:    I have personally reviewed and noted the following in the patient's chart:   Medical and social history Use of alcohol, tobacco or illicit drugs  Current medications and supplements including opioid prescriptions. Patient is not currently taking opioid prescriptions. Functional ability and status Nutritional status Physical activity Advanced directives List of other physicians Hospitalizations, surgeries, and ER visits in previous 12 months Vitals Screenings to include cognitive, depression, and falls Referrals and appointments  In addition, I have reviewed and discussed with patient certain preventive protocols, quality metrics, and best practice recommendations. A written personalized care plan for preventive services as well as general preventive health recommendations were provided to patient.   Jhonnie GORMAN Das, LPN   3/[id number]/[id number]   After Visit Summary: (MyChart) Due to this being a telephonic visit, the after visit summary with patients personalized plan was offered to patient via MyChart   Notes: MAMMOGRAM ORDERED

## 2024-04-25 NOTE — Patient Instructions (Addendum)
 Tricia Vasquez , Thank you for taking time out of your busy schedule to complete your Annual Wellness Visit with me. I enjoyed our conversation and look forward to speaking with you again next year. I, as well as your care team,  appreciate your ongoing commitment to your health goals. Please review the following plan we discussed and let me know if I can assist you in the future.  You have an order for:  []   2D Mammogram  [x]   3D Mammogram  []   Bone Density     Please call for appointment:  [facility]  [address] [place] [address] [phone] [facility] [address] [name], [place] [address] [phone] [facility] [address] [name] Tricia Vasquez [place], [place] [address] [phone]   Make sure to wear two-piece clothing.  No lotions, powders, or deodorants the day of the appointment. Make sure to bring picture ID and insurance card.  Bring list of medications you are currently taking including any supplements.   Schedule your [place] screening mammogram through MyChart!   Log into your MyChart account.  Go to 'Visit' (or 'Appointments' if on mobile App) --> Schedule an Appointment  Under 'Select a Reason for Visit' choose the Mammogram Screening option.  Complete the pre-visit questions and select the time and place that best fits your schedule.   Follow up Visits: Next Medicare AWV with our clinical staff:   05/08/25 @ 12:40 PM BY PHONE Have you seen your provider in the last 6 months (3 months if uncontrolled diabetes)? Yes  Clinician Recommendations:  Aim for 30 minutes of exercise or brisk walking, 6-8 glasses of water , and 5 servings of fruits and vegetables each day. TAKE CARE!      This is a list of the screening recommended for you and due dates:  Health Maintenance  Topic Date Due   Hepatitis B Vaccine (1 of 3 - Risk 3-dose series) Never  done   Mammogram  04/24/2024   Flu Shot  05/31/2024   DEXA scan (bone density measurement)  04/24/2025   Medicare Annual Wellness Visit  04/25/2025   DTaP/Tdap/Td vaccine (2 - Td or Tdap) 09/25/2028   Colon Cancer Screening  10/08/2028   Hepatitis C Screening  Completed   HPV Vaccine  Aged Out   Meningitis B Vaccine  Aged Out   Pneumococcal Vaccine for age over 47  Discontinued   COVID-19 Vaccine  Discontinued   Zoster (Shingles) Vaccine  Discontinued    Advanced directives: (ACP Link)Information on Advanced Care Planning can be found at [employer] of [facility] Advance Health Care Directives Advance Health Care Directives. [email or web address]  Advance Care Planning is important because it:  [x]  Makes sure you receive the medical care that is consistent with your values, goals, and preferences  [x]  It provides guidance to your family and loved ones and reduces their decisional burden about whether or not they are making the right decisions based on your wishes.  Follow the link provided in your after visit summary or read over the paperwork we have mailed to you to help you started getting your Advance Directives in place. If you need assistance in completing these, please reach out to us  so that we can help you!

## 2024-05-27 ENCOUNTER — Ambulatory Visit: Payer: Self-pay | Admitting: Family Medicine

## 2024-06-07 ENCOUNTER — Ambulatory Visit: Admitting: Family Medicine

## 2024-06-07 ENCOUNTER — Telehealth: Payer: Self-pay

## 2024-06-07 DIAGNOSIS — J45901 Unspecified asthma with (acute) exacerbation: Secondary | ICD-10-CM

## 2024-06-07 DIAGNOSIS — H8103 Meniere's disease, bilateral: Secondary | ICD-10-CM

## 2024-06-07 DIAGNOSIS — F320 Major depressive disorder, single episode, mild: Secondary | ICD-10-CM

## 2024-06-07 DIAGNOSIS — I252 Old myocardial infarction: Secondary | ICD-10-CM

## 2024-06-07 DIAGNOSIS — R42 Dizziness and giddiness: Secondary | ICD-10-CM

## 2024-06-07 DIAGNOSIS — I10 Essential (primary) hypertension: Secondary | ICD-10-CM

## 2024-06-07 NOTE — Telephone Encounter (Signed)
 Copied from CRM [id number]. Topic: Appointments - Appointment Cancel/Reschedule >> Jun 07, 2024 12:28 PM Fonda T wrote: Patient/patient representative is calling to cancel or reschedule an appointment. Refer to attachments for appointment information.   Patient requested due to not feeling well. Offered triage at time of call, patient declined.   Appointment rescheduled as requested, updated appointment details given, verbalized understanding, no further assistance needed at this time.  Sending CRM to office to inform.

## 2024-06-13 ENCOUNTER — Other Ambulatory Visit: Payer: Self-pay | Admitting: Family Medicine

## 2024-06-13 DIAGNOSIS — I10 Essential (primary) hypertension: Secondary | ICD-10-CM

## 2024-06-13 DIAGNOSIS — J45901 Unspecified asthma with (acute) exacerbation: Secondary | ICD-10-CM

## 2024-06-17 ENCOUNTER — Telehealth: Payer: Self-pay | Admitting: Pharmacy Technician

## 2024-06-17 ENCOUNTER — Other Ambulatory Visit (HOSPITAL_COMMUNITY): Payer: Self-pay

## 2024-06-17 NOTE — Telephone Encounter (Signed)
 Pharmacy Patient Advocate Encounter   Received notification from CoverMyMeds that prior authorization for FLUoxetine  HCl 20MG  tablets is required/requested.   Insurance verification completed.   The patient is insured through [facility] .   Per test claim:  FLUoxetine  HCI 20MG  CAPSULES is preferred by the insurance.  If suggested medication is appropriate, Please send in a new RX and discontinue this one. If not, please advise as to why it's not appropriate so that we may request a Prior Authorization. Please note, some preferred medications may still require a PA.  If the suggested medications have not been trialed and there are no contraindications to their use, the PA will not be submitted, as it will not be approved.

## 2024-06-17 NOTE — Telephone Encounter (Signed)
 Requested medications are due for refill today.  yes  Requested medications are on the active medications list.  yes  Last refill. 11/27/2023 #90 1 rf for both  Future visit scheduled.   yes  Notes to clinic.  Pt has missed scheduled appts. Pt last seen 01/10/2023. Please review for refill.    Requested Prescriptions  Pending Prescriptions Disp Refills   hydrochlorothiazide  (HYDRODIURIL ) 25 MG tablet [Pharmacy Med Name: HYDROCHLOROTHIAZIDE  25 MG TAB] 90 tablet 1    Sig: Take 1 tablet (25 mg total) by mouth daily.     Cardiovascular: Diuretics - Thiazide Failed - 06/17/2024  3:28 PM      Failed - Cr in normal range and within 180 days    Creat  Date Value Ref Range Status  09/04/2023 1.15 (H) 0.60 - 1.00 mg/dL Final         Failed - K in normal range and within 180 days    Potassium  Date Value Ref Range Status  09/04/2023 4.6 3.5 - 5.3 mmol/L Final         Failed - Na in normal range and within 180 days    Sodium  Date Value Ref Range Status  09/04/2023 139 135 - 146 mmol/L Final         Passed - Last BP in normal range    BP Readings from Last 1 Encounters:  11/27/23 126/70         Passed - Valid encounter within last 6 months    Recent Outpatient Visits   None     Future Appointments             In 2 days Agbor-Etang, Redell, MD [place] [facility]             montelukast  (SINGULAIR ) 10 MG tablet [Pharmacy Med Name: MONTELUKAST  SOD 10 MG TABLET] 90 tablet 1    Sig: TAKE 1 TABLET BY MOUTH EVERYDAY AT BEDTIME     Pulmonology:  Leukotriene Inhibitors Passed - 06/17/2024  3:28 PM      Passed - Valid encounter within last 12 months    Recent Outpatient Visits   None     Future Appointments             In 2 days Darliss Redell, MD [facility] at [facility]

## 2024-06-18 MED ORDER — FLUOXETINE HCL 20 MG PO CAPS: 20.0000 mg | ORAL_CAPSULE | Freq: Every day | ORAL | 0 refills | Status: DC

## 2024-06-18 NOTE — Addendum Note (Signed)
 Addended by: Sukari Grist on: 06/18/2024 05:46 PM   Modules accepted: Orders

## 2024-06-19 ENCOUNTER — Ambulatory Visit: Admitting: Cardiology

## 2024-06-20 ENCOUNTER — Ambulatory Visit: Attending: Cardiology | Admitting: Cardiology

## 2024-06-20 ENCOUNTER — Encounter: Payer: Self-pay | Admitting: Cardiology

## 2024-06-20 VITALS — BP 122/70 | HR 55 | Ht 60.0 in | Wt 149.1 lb

## 2024-06-20 DIAGNOSIS — E7800 Pure hypercholesterolemia, unspecified: Secondary | ICD-10-CM | POA: Insufficient documentation

## 2024-06-20 DIAGNOSIS — I10 Essential (primary) hypertension: Secondary | ICD-10-CM | POA: Insufficient documentation

## 2024-06-20 DIAGNOSIS — I2510 Atherosclerotic heart disease of native coronary artery without angina pectoris: Secondary | ICD-10-CM | POA: Insufficient documentation

## 2024-06-20 NOTE — Progress Notes (Signed)
 Cardiology Office Note:    Date:  06/20/2024   ID:  Tricia Vasquez, Tricia Vasquez 1951-04-09, MRN [id number]  PCP:  Leavy Mole, PA-C  [facility] Cardiologist:  Redell Cave, MD  [facility] Electrophysiologist:  None   Referring MD: Leavy Mole, PA-C   Chief Complaint  Patient presents with   12 month follow up     Doing well. Denies chest pain or shortness of breath.     History of Present Illness:    Tricia Vasquez is a 73 y.o. 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    Denies chest pain or shortness of breath.  Takes aspirin 81 mg daily, tries to eat healthy.  Has tried several cholesterol medications, did not tolerate including statins, Zetia  which made patient nauseous.  Declined Repatha  in the past due to fear of needles.  Prior notes/testing Echo 2021 EF 55 to 60%. 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   Mild mitral regurgitation by prior echocardiogram    Osteoporosis    Seasonal allergies    Sleep apnea    No CPAP   Stroke [facility]) 2013   Vertigo     Past Surgical History:  Procedure Laterality Date   ABDOMINAL HYSTERECTOMY     COLON SURGERY     COLONOSCOPY WITH PROPOFOL  N/A 10/04/2018   Procedure: COLONOSCOPY WITH PROPOFOL ;  Surgeon: Jinny Carmine, MD;  Location: [facility];  Service:  Endoscopy;  Laterality: N/A;  sleep apnea   COLONOSCOPY WITH PROPOFOL  N/A 10/09/2023   Procedure: COLONOSCOPY WITH PROPOFOL ;  Surgeon: Jinny Carmine, MD;  Location: [facility];  Service: Endoscopy;  Laterality: N/A;   DILATION AND CURETTAGE OF UTERUS     OVARIAN CYST REMOVAL     POLYPECTOMY  10/04/2018   Procedure: POLYPECTOMY;  Surgeon: Jinny Carmine, MD;  Location: [facility];  Service: Endoscopy;;   POLYPECTOMY  10/09/2023   Procedure: POLYPECTOMY;  Surgeon: Jinny Carmine, MD;  Location: [facility];  Service: Endoscopy;;   TONSILLECTOMY      Current Medications: Current Meds  Medication Sig   albuterol  (VENTOLIN  HFA) 108 (90 Base) MCG/ACT inhaler Inhale 2 puffs into the lungs every 4 (four) hours as needed for wheezing or shortness of breath.   aspirin EC 81 MG tablet Take 81 mg by mouth daily.   atenolol  (TENORMIN ) 50 MG tablet Take 1 tablet (50 mg total) by mouth daily.   Cholecalciferol (VITAMIN D ) 2000 units tablet Take 2,000 Units by mouth daily.   COLLAGEN PO Take 1 Scoop by mouth 4 (four) times a week. Native Path Collagen   Cyanocobalamin (VITAMIN B-12) 5000 MCG SUBL Place 1 tablet under the tongue 3 (three) times a week.  fexofenadine  (ALLEGRA ) 180 MG tablet Take 1 tablet (180 mg total) by mouth daily.   FLUoxetine  (PROZAC ) 20 MG capsule Take 1 capsule (20 mg total) by mouth daily.   fluticasone  (FLONASE ) 50 MCG/ACT nasal spray USE 2 SPRAYS IN EACH NOSTRIL ONCE DAILY   GARLIC OIL PO Take 1 pg/oz/day by mouth once.   Ginger, Zingiber officinalis, (GINGER PO) Take by mouth.   hydrochlorothiazide  (HYDRODIURIL ) 25 MG tablet TAKE 1 TABLET (25 MG TOTAL) BY MOUTH DAILY.   L-Arginine 1000 MG TABS Take 1 tablet by mouth 1 day or 1 dose.   meclizine  (ANTIVERT ) 25 MG tablet Take 0.5-1 tablets (12.5-25 mg total) by mouth 3 (three) times daily as needed for dizziness (vertigo). TAKE 1 TABLET BY MOUTH 3 TIMES DAILY AS NEEDED FOR DIZZINESS.   Milk Thistle 250 MG CAPS  Take 1 capsule by mouth once.   Misc Natural Products (DANDELION ROOT PO) Take 1 tablet by mouth once.   montelukast  (SINGULAIR ) 10 MG tablet TAKE 1 TABLET BY MOUTH EVERYDAY AT BEDTIME   Multiple Vitamins-Minerals (LIVER DETOX PO) Take by mouth daily.   Multiple Vitamins-Minerals (MULTIVITAMIN WOMENS 50+ ADV PO) Take by mouth.   nitroGLYCERIN  (NITROSTAT ) 0.4 MG SL tablet Place 1 tablet (0.4 mg total) under the tongue every 5 (five) minutes as needed for chest pain.   Omega-3 Krill Oil 1000 MG CAPS Take 2 capsules by mouth once.   OVER THE COUNTER MEDICATION Cream of tartar 3 times per month   OVER THE COUNTER MEDICATION Baking soda H2O   OVER THE COUNTER MEDICATION Clove garlic   OVER THE COUNTER MEDICATION Ginger root   Pseudoephedrine-guaiFENesin (MUCINEX D PO) Take by mouth as needed. Daytime/Night   PURE L-CITRULLINE PO Take 750 mg by mouth.   TURMERIC PO Take 1 tablet by mouth once.     Allergies:   Penicillins, Statins, and Aspirin   Social History   Socioeconomic History   Marital status: Divorced    Spouse name: Not on file   Number of children: Not on file   Years of education: Not on file   Highest education level: Not on file  Occupational History   Not on file  Tobacco Use   Smoking status: Former    Current packs/day: 0.00    Types: Cigarettes    Quit date: 1970    Years since quitting: 55.6   Smokeless tobacco: Never  Vaping Use   Vaping status: Never Used  Substance and Sexual Activity   Alcohol use: No    Alcohol/week: 0.0 standard drinks of alcohol    Comment: may have a drink 1x/mo   Drug use: No   Sexual activity: Not Currently  Other Topics Concern   Not on file  Social History Narrative   Pt lives alone    Social Drivers of Health   Financial Resource Strain: Low Risk  (04/25/2024)   Overall Financial Resource Strain (CARDIA)    Difficulty of Paying Living Expenses: Not hard at all  Food Insecurity: No Food Insecurity (04/25/2024)   Hunger  Vital Sign    Worried About Running Out of Food in the Last Year: Never true    Ran Out of Food in the Last Year: Never true  Transportation Needs: No Transportation Needs (04/25/2024)   PRAPARE - Administrator, Civil Service (Medical): No    Lack of Transportation (Non-Medical): No  Physical Activity: Sufficiently Active (04/25/2024)   Exercise Vital Sign    Days of Exercise per  Week: 7 days    Minutes of Exercise per Session: 30 min  Stress: No Stress Concern Present (04/25/2024)   [employer] of Occupational Health - Occupational Stress Questionnaire    Feeling of Stress: Only a little  Social Connections: Socially Isolated (04/25/2024)   Social Connection and Isolation Panel    Frequency of Communication with Friends and Family: More than three times a week    Frequency of Social Gatherings with Friends and Family: Once a week    Attends Religious Services: Never    Database administrator or Organizations: No    Attends Engineer, structural: Never    Marital Status: Divorced     Family History: The patient's family history includes Hypertension in her brother, father, mother, and sister.  ROS:   Please see the history of present illness.     All other systems reviewed and are negative.  EKGs/Labs/Other Studies Reviewed:    The following studies were reviewed today:  EKG Interpretation Date/Time:  Thursday June 20 2024 09:03:06 EDT Ventricular Rate:  55 PR Interval:  162 QRS Duration:  78 QT Interval:  436 QTC Calculation: 417 R Axis:   -5  Text Interpretation: Sinus bradycardia Nonspecific ST and T wave abnormality Confirmed by Darliss Rogue ([id number]) on 06/20/2024 9:15:28 AM    Recent Labs: 09/04/2023: ALT 19; BUN 24; Creat 1.15; Potassium 4.6; Sodium 139  Recent Lipid Panel    Component Value Date/Time   CHOL 250 (H) 01/10/2023 1431   TRIG 127 01/10/2023 1431   HDL 53 01/10/2023 1431   CHOLHDL 4.7 01/10/2023 1431   LDLCALC 171  (H) 01/10/2023 1431     Risk Assessment/Calculations:      Physical Exam:    VS:  BP 122/70 (BP Location: Left Arm, Patient Position: Sitting, Cuff Size: Normal)   Pulse (!) 55   Ht 5' (1.524 m)   Wt 149 lb 2 oz (67.6 kg)   SpO2 98%   BMI 29.12 kg/m     Wt Readings from Last 3 Encounters:  06/20/24 149 lb 2 oz (67.6 kg)  11/27/23 153 lb (69.4 kg)  10/09/23 178 lb (80.7 kg)     GEN:  Well nourished, well developed in no acute distress HEENT: Normal NECK: No JVD; No carotid bruits CARDIAC: RRR, no murmurs, rubs, gallops RESPIRATORY:  Clear to auscultation without rales, wheezing or rhonchi  ABDOMEN: Soft, non-tender, non-distended MUSCULOSKELETAL:  No edema; No deformity  SKIN: Warm and dry NEUROLOGIC:  Alert and oriented x 3 PSYCHIATRIC:  Normal affect   ASSESSMENT:    1. Coronary artery disease involving native heart, unspecified vessel or lesion type, unspecified whether angina present   2. Primary hypertension   3. Pure hypercholesterolemia    PLAN:    In order of problems listed above:  Patient states having history of MI in the 44s and 90s.  Echo 12/21 EF 55 to 60%.  Not tolerant to Zetia  or statins.  PCSK9 inhibitor was offered to patient but she states not liking needles, continue aspirin 81 mg daily.  Currently has no symptoms of chest pain or shortness of breath. Hypertension, blood pressure controlled.  Continue HCTZ 25 mg daily, and atenolol  50 mg. History of hyperlipidemia, LDL not at goal, low-cholesterol diet.  Either intolerant or declined antilipids as in #1 above.  Follow up annually.   Shared Decision Making/Informed Consent      Medication Adjustments/Labs and Tests Ordered: Current medicines are reviewed at length with the patient  today.  Concerns regarding medicines are outlined above.  Orders Placed This Encounter  Procedures   EKG 12-Lead   No orders of the defined types were placed in this encounter.   Patient Instructions   Medication Instructions:  Your physician recommends that you continue on your current medications as directed. Please refer to the Current Medication list given to you today.   *If you need a refill on your cardiac medications before your next appointment, please call your pharmacy*  Lab Work: No labs ordered today  If you have labs (blood work) drawn today and your tests are completely normal, you will receive your results only by: MyChart Message (if you have MyChart) OR A paper copy in the mail If you have any lab test that is abnormal or we need to change your treatment, we will call you to review the results.  Testing/Procedures: No test ordered today   Follow-Up: At [facility], you and your health needs are our priority.  As part of our continuing mission to provide you with exceptional heart care, our providers are all part of one team.  This team includes your primary Cardiologist (physician) and Advanced Practice Providers or APPs (Physician Assistants and Nurse Practitioners) who all work together to provide you with the care you need, when you need it.  Your next appointment:   1 year(s)  Provider:   You may see Redell Cave, MD or one of the following Advanced Practice Providers on your designated Care Team:   Lonni Meager, NP Lesley Maffucci, PA-C Bernardino Bring, PA-C Cadence [place], PA-C Tylene Lunch, NP Barnie Hila, NP    We recommend signing up for the patient portal called MyChart.  Sign up information is provided on this After Visit Summary.  MyChart is used to connect with patients for Virtual Visits (Telemedicine).  Patients are able to view lab/test results, encounter notes, upcoming appointments, etc.  Non-urgent messages can be sent to your provider as well.   To learn more about what you can do with MyChart, go to [email or web address].          Signed, Redell Cave, MD  06/20/2024 12:51 PM    [facility]  [facility]

## 2024-06-20 NOTE — Patient Instructions (Signed)

## 2024-07-01 ENCOUNTER — Other Ambulatory Visit: Payer: Self-pay | Admitting: Family Medicine

## 2024-07-01 DIAGNOSIS — I252 Old myocardial infarction: Secondary | ICD-10-CM

## 2024-07-02 ENCOUNTER — Other Ambulatory Visit (HOSPITAL_COMMUNITY): Payer: Self-pay

## 2024-07-02 ENCOUNTER — Ambulatory Visit: Admitting: Family Medicine

## 2024-07-02 NOTE — Telephone Encounter (Signed)
 Requested Prescriptions  Pending Prescriptions Disp Refills   atenolol  (TENORMIN ) 50 MG tablet [Pharmacy Med Name: ATENOLOL  50 MG TABLET] 90 tablet 0    Sig: TAKE 1 TABLET BY MOUTH EVERY DAY     Cardiovascular: Beta Blockers 2 Failed - 07/02/2024  1:47 PM      Failed - Cr in normal range and within 360 days    Creat  Date Value Ref Range Status  09/04/2023 1.15 (H) 0.60 - 1.00 mg/dL Final         Passed - Last BP in normal range    BP Readings from Last 1 Encounters:  06/20/24 122/70         Passed - Last Heart Rate in normal range    Pulse Readings from Last 1 Encounters:  06/20/24 (!) 55         Passed - Valid encounter within last 6 months    Recent Outpatient Visits   None

## 2024-07-15 ENCOUNTER — Ambulatory Visit: Admitting: Family Medicine

## 2024-07-17 ENCOUNTER — Other Ambulatory Visit: Payer: Self-pay | Admitting: Family Medicine

## 2024-07-17 DIAGNOSIS — J45901 Unspecified asthma with (acute) exacerbation: Secondary | ICD-10-CM

## 2024-07-17 DIAGNOSIS — I10 Essential (primary) hypertension: Secondary | ICD-10-CM

## 2024-07-18 NOTE — Telephone Encounter (Signed)
 Requested medication (s) are due for refill today: yes  Requested medication (s) are on the active medication list: yes  Last refill:  montelukast : 06/17/24 #30   hydrochlorothiazide : 06/17/24 #30  Future visit scheduled: yes  Notes to clinic:  last refill was #30 for both meds/ overdue labs   Requested Prescriptions  Pending Prescriptions Disp Refills   montelukast  (SINGULAIR ) 10 MG tablet [Pharmacy Med Name: MONTELUKAST  SOD 10 MG TABLET] 90 tablet 1    Sig: TAKE 1 TABLET BY MOUTH EVERYDAY AT BEDTIME     Pulmonology:  Leukotriene Inhibitors Passed - 07/18/2024  2:45 PM      Passed - Valid encounter within last 12 months    Recent Outpatient Visits   None             hydrochlorothiazide  (HYDRODIURIL ) 25 MG tablet [Pharmacy Med Name: HYDROCHLOROTHIAZIDE  25 MG TAB] 90 tablet 1    Sig: TAKE 1 TABLET (25 MG TOTAL) BY MOUTH DAILY.     Cardiovascular: Diuretics - Thiazide Failed - 07/18/2024  2:45 PM      Failed - Cr in normal range and within 180 days    Creat  Date Value Ref Range Status  09/04/2023 1.15 (H) 0.60 - 1.00 mg/dL Final         Failed - K in normal range and within 180 days    Potassium  Date Value Ref Range Status  09/04/2023 4.6 3.5 - 5.3 mmol/L Final         Failed - Na in normal range and within 180 days    Sodium  Date Value Ref Range Status  09/04/2023 139 135 - 146 mmol/L Final         Passed - Last BP in normal range    BP Readings from Last 1 Encounters:  06/20/24 122/70         Passed - Valid encounter within last 6 months    Recent Outpatient Visits   None

## 2024-07-24 ENCOUNTER — Ambulatory Visit: Admitting: Family Medicine

## 2024-07-25 ENCOUNTER — Ambulatory Visit
Admission: RE | Admit: 2024-07-25 | Discharge: 2024-07-25 | Disposition: A | Discharge status: Home / Self Care | Attending: Nurse Practitioner | Admitting: Nurse Practitioner

## 2024-07-25 ENCOUNTER — Encounter: Payer: Self-pay | Admitting: Nurse Practitioner

## 2024-07-25 ENCOUNTER — Ambulatory Visit: Payer: Self-pay | Admitting: Nurse Practitioner

## 2024-07-25 ENCOUNTER — Ambulatory Visit
Admission: RE | Admit: 2024-07-25 | Discharge: 2024-07-25 | Disposition: A | Discharge status: Home / Self Care | Source: Ambulatory Visit | Attending: Nurse Practitioner | Admitting: Nurse Practitioner

## 2024-07-25 ENCOUNTER — Ambulatory Visit: Admitting: Nurse Practitioner

## 2024-07-25 ENCOUNTER — Other Ambulatory Visit: Payer: Self-pay

## 2024-07-25 VITALS — BP 132/74 | HR 73 | Temp 97.9°F | Resp 16 | Ht 60.0 in | Wt 152.3 lb

## 2024-07-25 DIAGNOSIS — R052 Subacute cough: Secondary | ICD-10-CM | POA: Insufficient documentation

## 2024-07-25 DIAGNOSIS — J4540 Moderate persistent asthma, uncomplicated: Secondary | ICD-10-CM | POA: Diagnosis not present

## 2024-07-25 DIAGNOSIS — G4733 Obstructive sleep apnea (adult) (pediatric): Secondary | ICD-10-CM

## 2024-07-25 DIAGNOSIS — R42 Dizziness and giddiness: Secondary | ICD-10-CM

## 2024-07-25 DIAGNOSIS — F320 Major depressive disorder, single episode, mild: Secondary | ICD-10-CM

## 2024-07-25 DIAGNOSIS — I252 Old myocardial infarction: Secondary | ICD-10-CM

## 2024-07-25 DIAGNOSIS — I10 Essential (primary) hypertension: Secondary | ICD-10-CM

## 2024-07-25 DIAGNOSIS — I2510 Atherosclerotic heart disease of native coronary artery without angina pectoris: Secondary | ICD-10-CM

## 2024-07-25 DIAGNOSIS — Z8673 Personal history of transient ischemic attack (TIA), and cerebral infarction without residual deficits: Secondary | ICD-10-CM

## 2024-07-25 DIAGNOSIS — E785 Hyperlipidemia, unspecified: Secondary | ICD-10-CM

## 2024-07-25 DIAGNOSIS — N1831 Chronic kidney disease, stage 3a: Secondary | ICD-10-CM

## 2024-07-25 DIAGNOSIS — J302 Other seasonal allergic rhinitis: Secondary | ICD-10-CM

## 2024-07-25 DIAGNOSIS — F411 Generalized anxiety disorder: Secondary | ICD-10-CM

## 2024-07-25 DIAGNOSIS — R0602 Shortness of breath: Secondary | ICD-10-CM

## 2024-07-25 DIAGNOSIS — H8103 Meniere's disease, bilateral: Secondary | ICD-10-CM

## 2024-07-25 DIAGNOSIS — M810 Age-related osteoporosis without current pathological fracture: Secondary | ICD-10-CM

## 2024-07-25 DIAGNOSIS — Z1231 Encounter for screening mammogram for malignant neoplasm of breast: Secondary | ICD-10-CM

## 2024-07-25 LAB — CBC WITH DIFFERENTIAL/PLATELET
Absolute Lymphocytes: 1775 {cells}/uL (ref 850–3900)
Absolute Monocytes: 510 {cells}/uL (ref 200–950)
Basophils Absolute: 28 {cells}/uL (ref 0–200)
Basophils Relative: 0.5 %
Eosinophils Absolute: 302 {cells}/uL (ref 15–500)
Eosinophils Relative: 5.4 %
HCT: 40.2 % (ref 35.0–45.0)
Hemoglobin: 13.2 g/dL (ref 11.7–15.5)
MCH: 30.7 pg (ref 27.0–33.0)
MCHC: 32.8 g/dL (ref 32.0–36.0)
MCV: 93.5 fL (ref 80.0–100.0)
MPV: 12.2 fL (ref 7.5–12.5)
Monocytes Relative: 9.1 %
Neutro Abs: 2985 {cells}/uL (ref 1500–7800)
Neutrophils Relative %: 53.3 %
Platelets: 212 Thousand/uL (ref 140–400)
RBC: 4.3 Million/uL (ref 3.80–5.10)
RDW: 12.1 % (ref 11.0–15.0)
Total Lymphocyte: 31.7 %
WBC: 5.6 Thousand/uL (ref 3.8–10.8)

## 2024-07-25 LAB — COMPREHENSIVE METABOLIC PANEL WITH GFR
AG Ratio: 1.9 (calc) (ref 1.0–2.5)
ALT: 23 U/L (ref 6–29)
AST: 22 U/L (ref 10–35)
Albumin: 4.4 g/dL (ref 3.6–5.1)
Alkaline phosphatase (APISO): 70 U/L (ref 37–153)
BUN/Creatinine Ratio: 21 (calc) (ref 6–22)
BUN: 22 mg/dL (ref 7–25)
CO2: 31 mmol/L (ref 20–32)
Calcium: 9.5 mg/dL (ref 8.6–10.4)
Chloride: 102 mmol/L (ref 98–110)
Creat: 1.03 mg/dL — ABNORMAL HIGH (ref 0.60–1.00)
Globulin: 2.3 g/dL (ref 1.9–3.7)
Glucose, Bld: 147 mg/dL — ABNORMAL HIGH (ref 65–99)
Potassium: 4.5 mmol/L (ref 3.5–5.3)
Sodium: 142 mmol/L (ref 135–146)
Total Bilirubin: 0.5 mg/dL (ref 0.2–1.2)
Total Protein: 6.7 g/dL (ref 6.1–8.1)
eGFR: 57 mL/min/1.73m2 — ABNORMAL LOW (ref >=60)

## 2024-07-25 LAB — LIPID PANEL
Cholesterol: 222 mg/dL — ABNORMAL HIGH (ref <200)
HDL: 48 mg/dL — ABNORMAL LOW (ref >=50)
LDL Cholesterol (Calc): 143 mg/dL — ABNORMAL HIGH
Non-HDL Cholesterol (Calc): 174 mg/dL — ABNORMAL HIGH (ref <130)
Total CHOL/HDL Ratio: 4.6 (calc) (ref <5.0)
Triglycerides: 172 mg/dL — ABNORMAL HIGH (ref <150)

## 2024-07-25 LAB — VITAMIN D 25 HYDROXY (VIT D DEFICIENCY, FRACTURES): Vit D, 25-Hydroxy: 37 ng/mL (ref 30–100)

## 2024-07-25 MED ORDER — FEXOFENADINE HCL 180 MG PO TABS: 180.0000 mg | ORAL_TABLET | Freq: Every day | ORAL | 3 refills | Status: AC

## 2024-07-25 MED ORDER — MECLIZINE HCL 25 MG PO TABS: 12.5000 mg | ORAL_TABLET | Freq: Three times a day (TID) | ORAL | 5 refills | Status: AC | PRN

## 2024-07-25 MED ORDER — HYDROCHLOROTHIAZIDE 25 MG PO TABS: 25.0000 mg | ORAL_TABLET | Freq: Every day | ORAL | 1 refills | Status: AC

## 2024-07-25 MED ORDER — MONTELUKAST SODIUM 10 MG PO TABS: ORAL_TABLET | ORAL | 1 refills | Status: AC

## 2024-07-25 MED ORDER — FLUOXETINE HCL 20 MG PO CAPS
20.0000 mg | ORAL_CAPSULE | Freq: Every day | ORAL | 0 refills | Status: AC
Start: 2024-07-25 — End: ?

## 2024-07-25 MED ORDER — ALBUTEROL SULFATE HFA 108 (90 BASE) MCG/ACT IN AERS: 2.0000 | INHALATION_SPRAY | RESPIRATORY_TRACT | 5 refills | Status: AC | PRN

## 2024-07-25 MED ORDER — FLUTICASONE PROPIONATE 50 MCG/ACT NA SUSP
NASAL | 3 refills | Status: AC
Start: 2024-07-25 — End: ?

## 2024-07-25 MED ORDER — ATENOLOL 50 MG PO TABS: 50.0000 mg | ORAL_TABLET | Freq: Every day | ORAL | 1 refills | Status: AC

## 2024-07-25 MED ORDER — AZITHROMYCIN 250 MG PO TABS: ORAL_TABLET | ORAL | 0 refills | Status: DC

## 2024-07-25 NOTE — Progress Notes (Signed)
 BP 132/74 (Cuff Size: Large)   Pulse 73   Temp 97.9 F (36.6 C) (Oral)   Resp 16   Ht 5' (1.524 m)   Wt 152 lb 4.8 oz (69.1 kg)   SpO2 98%   BMI 29.74 kg/m    Subjective:    Patient ID: Tricia  Vasquez, female    DOB: [birth year], 72 y.o.   MRN: [id number]  HPI: Tricia  Vasquez is a 73 y.o. female  Chief Complaint  Patient presents with   Medical Management of Chronic Issues    6 month recheck   Discussed the use of AI scribe software for clinical note transcription with the patient, who gave verbal consent to proceed.  History of Present Illness Tricia  Vasquez is a 73 year old female with coronary artery disease and asthma who presents for a six-month follow-up.  Respiratory symptoms - Significant respiratory illness began August 2nd with allergy symptoms progressing to fluid accumulation in the left lung, high fever, and prolonged bedrest - Persistent symptoms for over 7 weeks, with ongoing significant drainage from the left lung - History of a similar episode in [place] affecting the left lung and causing cardiac pressure; declined surgery at that time - Current day is the first day she feels able to be active since onset of illness - [place], limiting access to medical care during illness - Drove herself to the appointment today  Asthma exacerbation - Asthma symptoms have worsened since recent lung illness - Continues to use albuterol  inhaler as needed - No longer taking Dulera, takes albuterol   Medication use and adverse reactions - Using Mucinex 24-hour pills and Mucinex cough syrup extensively for symptom management - Currently taking: aspirin 81 mg daily, atenolol  50 mg daily, fluoxetine  20 mg daily, Allegra  180 mg daily, Flonase  as needed, hydrochlorothiazide  25 mg daily, meclizine  for Meniere's disease, Singulair  10 mg daily - No longer taking Fosamax  due to adverse reaction and concerns regarding use with chronic kidney disease - Cautious  with medication selection due to chronic kidney disease stage 3  Gastrointestinal symptoms and appetite changes - Decreased appetite and difficulty eating since August - After eating, requires lying down immediately due to sensation of organs struggling to process food - Appetite beginning to improve, with return of hunger  Osteoporosis and vitamin d  insufficiency - History of osteoporosis and vitamin D  insufficiency - Last vitamin D  level was 29 ten months ago  Preventive health maintenance - Overdue for mammogram - Up to date on colonoscopy and DEXA scan         07/25/2024    2:11 PM 07/25/2024    1:47 PM 04/25/2024    1:28 PM  Depression screen PHQ 2/9  Decreased Interest 0 0 0  Down, Depressed, Hopeless 0 0 0  PHQ - 2 Score 0 0 0  Altered sleeping 0  0  Tired, decreased energy 0  0  Change in appetite 0  0  Feeling bad or failure about yourself  0  0  Trouble concentrating 0  0  Moving slowly or fidgety/restless 0  0  Suicidal thoughts 0  0  PHQ-9 Score 0  0  Difficult doing work/chores Not difficult at all  Not difficult at all       07/25/2024    2:11 PM 10/11/2022    3:14 PM 07/12/2022    2:36 PM 05/31/2021    1:54 PM  GAD 7 : Generalized Anxiety Score  Nervous, Anxious, on Edge 1 1  2 1  Control/stop worrying 1 1 2 1   Worry too much - different things 1 1 2 1   Trouble relaxing 1 1 2 1   Restless 1 1 2  0  Easily annoyed or irritable 0 0 0 0  Afraid - awful might happen 0 0 0 1  Total GAD 7 Score 5 5 10 5   Anxiety Difficulty Not difficult at all Not difficult at all Very difficult Somewhat difficult     Relevant past medical, surgical, family and social history reviewed and updated as indicated. Interim medical history since our last visit reviewed. Allergies and medications reviewed and updated.  Review of Systems  Constitutional: Negative for fever or weight change.  Respiratory:positive for cough and shortness of breath.   Cardiovascular: Negative for  chest pain or palpitations.  Gastrointestinal: Negative for abdominal pain, no bowel changes.  Musculoskeletal: Negative for gait problem or joint swelling.  Skin: Negative for rash.  Neurological: Negative for dizziness or headache.  No other specific complaints in a complete review of systems (except as listed in HPI above).      Objective:      BP 132/74 (Cuff Size: Large)   Pulse 73   Temp 97.9 F (36.6 C) (Oral)   Resp 16   Ht 5' (1.524 m)   Wt 152 lb 4.8 oz (69.1 kg)   SpO2 98%   BMI 29.74 kg/m    Wt Readings from Last 3 Encounters:  07/25/24 152 lb 4.8 oz (69.1 kg)  06/20/24 149 lb 2 oz (67.6 kg)  11/27/23 153 lb (69.4 kg)    Physical Exam GENERAL: Alert, cooperative, well developed, no acute distress. HEENT: Normocephalic, normal oropharynx, moist mucous membranes. CHEST: Lung sounds rhonchi on auscultation, otherwise clear to auscultation bilaterally. No wheezes,or crackles. CARDIOVASCULAR: Normal heart rate and rhythm, S1 and S2 normal without murmurs. ABDOMEN: Soft, non-tender, non-distended, without organomegaly, normal bowel sounds. EXTREMITIES: No cyanosis or edema. NEUROLOGICAL: Cranial nerves grossly intact, moves all extremities without gross motor or sensory deficit.  Results for orders placed or performed during the hospital encounter of 10/09/23  Surgical pathology   Collection Time: 10/09/23 12:00 AM  Result Value Ref Range   SURGICAL PATHOLOGY      SURGICAL PATHOLOGY [facility] [address], [address] [place], [place] [address] Telephone [phone] or [phone] Fax [phone]  REPORT OF SURGICAL PATHOLOGY   Accession #: [id number] Patient Name: Tricia, Vasquez  Visit # : [id number]  MRN: [id number] Physician: Jinny Carmine DOB/Age April 07, 1951 (Age: 71) Gender: F Collected Date: 10/09/2023 Received Date: 10/09/2023  FINAL DIAGNOSIS       1. Sigmoid  Colon Polyp,  :       - TUBULAR ADENOMA.      - NEGATIVE  FOR HIGH-GRADE DYSPLASIA AND MALIGNANCY.       DATE SIGNED OUT: 10/11/2023 ELECTRONIC SIGNATURE : Janel Md, Rexene , Pathologist, Electronic Signature  MICROSCOPIC DESCRIPTION  CASE COMMENTS STAINS USED IN DIAGNOSIS: H&E    CLINICAL HISTORY  SPECIMEN(S) OBTAINED 1. Sigmoid  Colon Polyp,  SPECIMEN COMMENTS: SPECIMEN CLINICAL INFORMATION: 1. Hx of colon polyps    Gross Description 1. Received in formalin labeled with the patient's name and Sigmoid colo n polyp is a 0.4 cm tan, rubbery, polypoid piece of tissue, submitted in toto in a single cassette.  (LEF, 10/10/2023)        Report signed out from the following location(s) [place] [address] [place], [place] [address] CLIA #:  [id number]  [facility] [address] [place], [place] [address] CLIA #: [id number]           Assessment & Plan:   Problem List Items Addressed This Visit       Cardiovascular and Mediastinum   Primary hypertension   Relevant Medications   hydrochlorothiazide  (HYDRODIURIL ) 25 MG tablet   atenolol  (TENORMIN ) 50 MG tablet   Other Relevant Orders   CBC with Differential/Platelet   Comprehensive metabolic panel with GFR   Coronary artery disease involving native heart - Primary   Relevant Medications   hydrochlorothiazide  (HYDRODIURIL ) 25 MG tablet   atenolol  (TENORMIN ) 50 MG tablet   Other Relevant Orders   Lipid panel     Respiratory   Apnea, sleep   Moderate persistent asthma without complication   Relevant Medications   montelukast  (SINGULAIR ) 10 MG tablet   fluticasone  (FLONASE ) 50 MCG/ACT nasal spray   fexofenadine  (ALLEGRA ) 180 MG tablet   albuterol  (VENTOLIN  HFA) 108 (90 Base) MCG/ACT inhaler     Nervous and Auditory   Meniere's disease of both ears   Relevant Medications   meclizine  (ANTIVERT ) 25 MG tablet     Musculoskeletal and Integument   Osteoporosis, post-menopausal   Relevant Orders   VITAMIN D  25  Hydroxy (Vit-D Deficiency, Fractures)     Genitourinary   Stage 3a chronic kidney disease (HCC)   Relevant Orders   Comprehensive metabolic panel with GFR     Other   Hx of myocardial infarction   Relevant Medications   atenolol  (TENORMIN ) 50 MG tablet   Other Relevant Orders   Lipid panel   Hx of completed stroke   Relevant Orders   Lipid panel   Hyperlipidemia   Relevant Medications   hydrochlorothiazide  (HYDRODIURIL ) 25 MG tablet   atenolol  (TENORMIN ) 50 MG tablet   Other Relevant Orders   Lipid panel   GAD (generalized anxiety disorder)   Relevant Medications   FLUoxetine  (PROZAC ) 20 MG capsule   Current mild episode of major depressive disorder, unspecified whether recurrent   Relevant Medications   FLUoxetine  (PROZAC ) 20 MG capsule   Other Visit Diagnoses       Encounter for screening mammogram for malignant neoplasm of breast       Relevant Orders   MM 3D SCREENING MAMMOGRAM BILATERAL BREAST     Chronic vertigo       refill on meds, difficult assessment with worsening recent vertigo and history of Mnire's   Relevant Medications   meclizine  (ANTIVERT ) 25 MG tablet     Seasonal allergies       refill on montelukast , allegra    Relevant Medications   fluticasone  (FLONASE ) 50 MCG/ACT nasal spray   fexofenadine  (ALLEGRA ) 180 MG tablet     Subacute cough       Relevant Medications   azithromycin  (ZITHROMAX ) 250 MG tablet   Other Relevant Orders   DG Chest 2 View     Shortness of breath            Assessment and Plan Assessment & Plan Acute lower respiratory tract infection, possible pneumonia Symptoms suggest a lower respiratory tract infection, possibly pneumonia, with a history of fluid accumulation in the left lung. Symptoms began in early August and include high fever, dizziness, and significant drainage. Physical examination reveals rhonchi sounds in the left lung. Differential diagnosis includes bronchial pneumonia. - Order chest x-ray at [facility] - Prescribe azithromycin  - If chest x-ray is positive for pneumonia,  prescribe a second antibiotic  Asthma/allergies Asthma exacerbation likely due to recent respiratory infection. - Continue albuterol  inhaler as needed Continue flonase , allegra    Chronic kidney disease stage 3 She is cautious about medication use to avoid progression to stage 4 and declined additional diuretics due to potential renal impact. - Order CMP to assess kidney function  Osteoporosis No longer taking Fosamax  due to adverse reactions and concerns related to chronic kidney disease.taking vitamin d , will recheck vitamin d  level.  Also recommend weight bearing exercises  Meniere's disease Continues to manage with meclizine .  Coronary artery disease Aspirin and atenolol  are being taken. -stable no changes  Depression and anxiety Fluoxetine  is being taken.  History of M I history of stroke Current management includes aspirin and atenolol . -checking lipid panel  Hyperlipidemia Management is ongoing with current medications including hydrochlorothiazide . - Order lipid panel  HTN -blood pressure today 132/74 -continue atenolol  50 mg daily, hydrochlorothiazide  25 mg daily  Depression/anxiety -stable -continue fluoxetine  20 mg daily  General Health Maintenance Overdue for mammogram. Up to date on colonoscopy and DEXA scan. - Order mammogram        Follow up plan: Return in about 6 months (around 01/22/2025) for follow up.

## 2024-07-26 ENCOUNTER — Other Ambulatory Visit: Payer: Self-pay

## 2024-07-26 ENCOUNTER — Other Ambulatory Visit: Payer: Self-pay | Admitting: Family Medicine

## 2024-07-26 DIAGNOSIS — R052 Subacute cough: Secondary | ICD-10-CM

## 2024-07-26 MED ORDER — AZITHROMYCIN 250 MG PO TABS: ORAL_TABLET | ORAL | 0 refills | Status: AC

## 2024-07-26 NOTE — Telephone Encounter (Signed)
 Copied from CRM [id number]. Topic: Clinical - Medication Question >> Jul 26, 2024  2:56 PM Shanda MATSU wrote: Reason for CRM: Patient called in regards to pharmacy adv her that med, azithromycin  (ZITHROMAX ) 250 MG tablet, has not been recvd, I did adv patient that I show pharmacy confirmed receipt of med on 07/25/24 at 2:05pm, adv patient that I will send req for follow up.

## 2025-05-08 ENCOUNTER — Ambulatory Visit
